# Patient Record
Sex: Male | Born: 1960 | Race: Black or African American | Hispanic: No | Smoking: Never smoker
Health system: Southern US, Community
[De-identification: ages and names within clinical notes are randomized; demographics above are authoritative.]

## PROBLEM LIST (undated history)

## (undated) DIAGNOSIS — D649 Anemia, unspecified: Secondary | ICD-10-CM

## (undated) DIAGNOSIS — I10 Essential (primary) hypertension: Secondary | ICD-10-CM

## (undated) DIAGNOSIS — E119 Type 2 diabetes mellitus without complications: Secondary | ICD-10-CM

## (undated) HISTORY — PX: APPENDECTOMY: SHX54

## (undated) HISTORY — DX: Type 2 diabetes mellitus without complications: E11.9

## (undated) HISTORY — DX: Anemia, unspecified: D64.9

## (undated) HISTORY — DX: Essential (primary) hypertension: I10

## (undated) MED FILL — Luspatercept-aamt For Subcutaneous Inj 75 MG: SUBCUTANEOUS | Qty: 1.5 | Status: AC

---

## 2021-07-23 NOTE — Progress Notes (Shared)
° °  [place], [place] [address]   CLINIC:  Medical Oncology/Hematology  No care team member to display  CHIEF COMPLAINTS/PURPOSE OF CONSULTATION:  ***  HISTORY OF PRESENTING ILLNESS:  Stephen Watts 60 y.o. male is here because of ***, at the request of {REFERRING PHYSICIAN}.  ***He was found to have abnormal CBC from *** ***He denies recent chest pain on exertion, shortness of breath on minimal exertion, pre-syncopal episodes, or palpitations. ***He had not noticed any recent bleeding such as epistaxis, hematuria or hematochezia ***The patient denies over the counter NSAID ingestion. He is not *** on antiplatelets agents. His last colonoscopy was *** ***He had no prior history or diagnosis of cancer. His age appropriate screening programs are up-to-date. ***He denies any pica and eats a variety of diet. ***He never donated blood or received blood transfusion ***The patient was prescribed oral iron supplements and he takes ***  MEDICAL HISTORY:  No past medical history on file.  SURGICAL HISTORY: *** The histories are not reviewed yet. Please review them in the "History" navigator section and refresh this [place].  SOCIAL HISTORY: Social History   Socioeconomic History   Marital status: Not on file    Spouse name: Not on file   Number of children: Not on file   Years of education: Not on file   Highest education level: Not on file  Occupational History   Not on file  Tobacco Use   Smoking status: Not on file   Smokeless tobacco: Not on file  Substance and Sexual Activity   Alcohol use: Not on file   Drug use: Not on file   Sexual activity: Not on file  Other Topics Concern   Not on file  Social History Narrative   Not on file   Social Determinants of Health   Financial Resource Strain: Not on file  Food Insecurity: Not on file  Transportation Needs: Not on file  Physical Activity: Not on file  Stress: Not on file   Social Connections: Not on file  Intimate Partner Violence: Not on file    FAMILY HISTORY: No family history on file.  ALLERGIES:  has no allergies on file.  MEDICATIONS:  No current outpatient medications on file.   No current facility-administered medications for this visit.    REVIEW OF SYSTEMS:   Review of Systems - Oncology   PHYSICAL EXAMINATION: ECOG PERFORMANCE STATUS: {CHL ONC ECOG PS:[id number]}  There were no vitals filed for this visit. There were no vitals filed for this visit. Physical Exam   LABORATORY DATA:  I have reviewed the data as listed No results found for this or any previous visit (from the past 2160 hour(s)).  RADIOGRAPHIC STUDIES: I have personally reviewed the radiological images as listed and agreed with the findings in the report. No results found.  ASSESSMENT:  ***   PLAN:  ***   All questions were answered. The patient knows to call the clinic with any problems, questions or concerns.  Derek Jack, MD 07/23/21 6:41 PM  [place] [phone]   I, ***, am acting as a scribe for Dr. Derek Jack.  {Add Barista Statement}

## 2021-07-24 ENCOUNTER — Encounter (HOSPITAL_COMMUNITY): Payer: 59 | Admitting: Hematology

## 2021-08-28 ENCOUNTER — Encounter (HOSPITAL_COMMUNITY): Payer: 59 | Admitting: Hematology

## 2021-09-11 ENCOUNTER — Other Ambulatory Visit (HOSPITAL_COMMUNITY): Payer: Self-pay | Admitting: Physician Assistant

## 2021-09-11 ENCOUNTER — Other Ambulatory Visit: Payer: Self-pay

## 2021-09-11 ENCOUNTER — Inpatient Hospital Stay (HOSPITAL_COMMUNITY): Payer: 59

## 2021-09-11 ENCOUNTER — Inpatient Hospital Stay (HOSPITAL_COMMUNITY): Payer: 59 | Attending: Hematology | Admitting: Hematology

## 2021-09-11 VITALS — BP 140/71 | HR 100 | Temp 98.5°F | Resp 18 | Ht 67.0 in | Wt 223.8 lb

## 2021-09-11 DIAGNOSIS — Z803 Family history of malignant neoplasm of breast: Secondary | ICD-10-CM | POA: Diagnosis not present

## 2021-09-11 DIAGNOSIS — R5383 Other fatigue: Secondary | ICD-10-CM | POA: Insufficient documentation

## 2021-09-11 DIAGNOSIS — R197 Diarrhea, unspecified: Secondary | ICD-10-CM | POA: Diagnosis not present

## 2021-09-11 DIAGNOSIS — Z800 Family history of malignant neoplasm of digestive organs: Secondary | ICD-10-CM | POA: Insufficient documentation

## 2021-09-11 DIAGNOSIS — R634 Abnormal weight loss: Secondary | ICD-10-CM | POA: Insufficient documentation

## 2021-09-11 DIAGNOSIS — R109 Unspecified abdominal pain: Secondary | ICD-10-CM | POA: Diagnosis not present

## 2021-09-11 DIAGNOSIS — D649 Anemia, unspecified: Secondary | ICD-10-CM | POA: Diagnosis present

## 2021-09-11 DIAGNOSIS — D75839 Thrombocytosis, unspecified: Secondary | ICD-10-CM | POA: Diagnosis present

## 2021-09-11 DIAGNOSIS — R350 Frequency of micturition: Secondary | ICD-10-CM | POA: Insufficient documentation

## 2021-09-11 DIAGNOSIS — R351 Nocturia: Secondary | ICD-10-CM | POA: Diagnosis not present

## 2021-09-11 DIAGNOSIS — R7989 Other specified abnormal findings of blood chemistry: Secondary | ICD-10-CM | POA: Diagnosis not present

## 2021-09-11 DIAGNOSIS — E669 Obesity, unspecified: Secondary | ICD-10-CM | POA: Diagnosis not present

## 2021-09-11 LAB — CBC WITH DIFFERENTIAL/PLATELET
Abs Immature Granulocytes: 0.11 10*3/uL — ABNORMAL HIGH (ref 0.00–0.07)
Basophils Absolute: 0.1 10*3/uL (ref 0.0–0.1)
Basophils Relative: 1 %
Eosinophils Absolute: 0.1 10*3/uL (ref 0.0–0.5)
Eosinophils Relative: 1 %
HCT: 20.6 % — ABNORMAL LOW (ref 39.0–52.0)
Hemoglobin: 6.7 g/dL — CL (ref 13.0–17.0)
Immature Granulocytes: 2 %
Lymphocytes Relative: 30 %
Lymphs Abs: 2 10*3/uL (ref 0.7–4.0)
MCH: 32.1 pg (ref 26.0–34.0)
MCHC: 32.5 g/dL (ref 30.0–36.0)
MCV: 98.6 fL (ref 80.0–100.0)
Monocytes Absolute: 0.8 10*3/uL (ref 0.1–1.0)
Monocytes Relative: 12 %
Neutro Abs: 3.5 10*3/uL (ref 1.7–7.7)
Neutrophils Relative %: 54 %
Platelets: 645 10*3/uL — ABNORMAL HIGH (ref 150–400)
RBC: 2.09 MIL/uL — ABNORMAL LOW (ref 4.22–5.81)
RDW: 28.8 % — ABNORMAL HIGH (ref 11.5–15.5)
WBC: 6.6 10*3/uL (ref 4.0–10.5)
nRBC: 1.7 % — ABNORMAL HIGH (ref 0.0–0.2)

## 2021-09-11 LAB — COMPREHENSIVE METABOLIC PANEL
ALT: 22 U/L (ref 0–44)
AST: 23 U/L (ref 15–41)
Albumin: 4.4 g/dL (ref 3.5–5.0)
Alkaline Phosphatase: 53 U/L (ref 38–126)
Anion gap: 9 (ref 5–15)
BUN: 15 mg/dL (ref 8–23)
CO2: 25 mmol/L (ref 22–32)
Calcium: 9.6 mg/dL (ref 8.9–10.3)
Chloride: 103 mmol/L (ref 98–111)
Creatinine, Ser: 1 mg/dL (ref 0.61–1.24)
GFR, Estimated: >60 mL/min (ref >60)
Glucose, Bld: 175 mg/dL — ABNORMAL HIGH (ref 70–99)
Potassium: 3.7 mmol/L (ref 3.5–5.1)
Sodium: 137 mmol/L (ref 135–145)
Total Bilirubin: 1 mg/dL (ref 0.3–1.2)
Total Protein: 8.2 g/dL — ABNORMAL HIGH (ref 6.5–8.1)

## 2021-09-11 LAB — IRON AND TIBC
Iron: 196 ug/dL — ABNORMAL HIGH (ref 45–182)
Saturation Ratios: 85 % — ABNORMAL HIGH (ref 17.9–39.5)
TIBC: 232 ug/dL — ABNORMAL LOW (ref 250–450)
UIBC: 36 ug/dL

## 2021-09-11 LAB — RETICULOCYTES
Immature Retic Fract: 32.1 % — ABNORMAL HIGH (ref 2.3–15.9)
RBC.: 2.05 MIL/uL — ABNORMAL LOW (ref 4.22–5.81)
Retic Count, Absolute: 20.3 10*3/uL (ref 19.0–186.0)
Retic Ct Pct: 1 % (ref 0.4–3.1)

## 2021-09-11 LAB — FERRITIN: Ferritin: 1070 ng/mL — ABNORMAL HIGH (ref 24–336)

## 2021-09-11 LAB — LACTATE DEHYDROGENASE: LDH: 160 U/L (ref 98–192)

## 2021-09-11 NOTE — Progress Notes (Signed)
[place], [place] [address]   CLINIC:  Medical Oncology/Hematology  Patient Care Team: System, Provider Not In as PCP - General Derek Jack, MD as Medical Oncologist (Hematology)  CHIEF COMPLAINTS/PURPOSE OF CONSULTATION:  Evaluation of normocytic anemia  HISTORY OF PRESENTING ILLNESS:  Stephen Watts 61 y.o. male is here because of evaluation of anemia, at the request of Joyce Gross, [place].  Today he reports feeling well. He ws diagnosed with anemia 1 year ago. He denies history of blood transfusion or iron infusions. He is taking 2 iron tablets daily, and he has been taking them for 1 year. His last colonoscopy was 3 years ago in [place]. He reports fatigue and that his feet feel occasionally heavy. He denies hematuria, hematochezia, and black stools. He denies ice pica. He denies fevers and drenching night sweats. He reports losing 30 lbs after starting metformin 6 months ago; he reports frequent diarrhea following starting diarrhea. He reports right flank pain. He denies dysuria. He reports chronic urinary frequency. He denies history of kidney stones. He currently works as a Building control surveyor. He denies smoking history. His paternal aunt had pancreatic cancer, and his sister had breast cancer.   MEDICAL HISTORY:  No past medical history on file.  SURGICAL HISTORY:   SOCIAL HISTORY: Social History   Socioeconomic History   Marital status: Married    Spouse name: Not on file   Number of children: Not on file   Years of education: Not on file   Highest education level: Not on file  Occupational History   Not on file  Tobacco Use   Smoking status: Not on file   Smokeless tobacco: Not on file  Substance and Sexual Activity   Alcohol use: Not on file   Drug use: Not on file   Sexual activity: Not on file  Other Topics Concern   Not on file  Social History Narrative   Not on file   Social Determinants of Health   Financial  Resource Strain: Not on file  Food Insecurity: Not on file  Transportation Needs: Not on file  Physical Activity: Not on file  Stress: Not on file  Social Connections: Not on file  Intimate Partner Violence: Not on file    FAMILY HISTORY: No family history on file.  ALLERGIES:  is allergic to ibuprofen and sulfa antibiotics.  MEDICATIONS:  Current Outpatient Medications  Medication Sig Dispense Refill   metFORMIN (GLUCOPHAGE) 500 MG tablet Take by mouth 2 (two) times daily with a meal.     olmesartan-hydrochlorothiazide (BENICAR HCT) 40-25 MG tablet Take 1 tablet by mouth daily.     No current facility-administered medications for this visit.    REVIEW OF SYSTEMS:   Review of Systems  Constitutional:  Positive for fatigue. Negative for appetite change and fever.  Gastrointestinal:  Negative for blood in stool.  Endocrine: Negative for hot flashes.  Genitourinary:  Positive for frequency and nocturia. Negative for dysuria and hematuria.   Musculoskeletal:  Positive for flank pain (R side 6/10).  All other systems reviewed and are negative.   PHYSICAL EXAMINATION: ECOG PERFORMANCE STATUS: 0 - Asymptomatic  Vitals:   09/11/21 1200  BP: 140/71  Pulse: 100  Resp: 18  Temp: 98.5 F (36.9 C)  SpO2: 100%   Filed Weights   09/11/21 1200  Weight: 223 lb 12.8 oz (101.5 kg)   Physical Exam Vitals reviewed.  Constitutional:      Appearance: Normal appearance. He is  obese.  Cardiovascular:     Rate and Rhythm: Normal rate and regular rhythm.     Pulses: Normal pulses.     Heart sounds: Normal heart sounds.  Pulmonary:     Effort: Pulmonary effort is normal.     Breath sounds: Normal breath sounds.  Abdominal:     Palpations: Abdomen is soft. There is no hepatomegaly, splenomegaly or mass.     Tenderness: There is no abdominal tenderness.  Musculoskeletal:     Right lower leg: No edema.     Left lower leg: No edema.  Neurological:     General: No focal deficit  present.     Mental Status: He is alert and oriented to person, place, and time.  Psychiatric:        Mood and Affect: Mood normal.        Behavior: Behavior normal.     LABORATORY DATA:  I have reviewed the data as listed Recent Results (from the past 2160 hour(s))  Reticulocytes     Status: Abnormal   Collection Time: 09/11/21 12:49 PM  Result Value Ref Range   Retic Ct Pct 1.0 0.4 - 3.1 %   RBC. 2.05 (L) 4.22 - 5.81 MIL/uL   Retic Count, Absolute 20.3 19.0 - 186.0 K/uL   Immature Retic Fract 32.1 (H) 2.3 - 15.9 %    Comment: Performed at [facility], [address], [place], [place] [address]  Lactate dehydrogenase     Status: None   Collection Time: 09/11/21 12:49 PM  Result Value Ref Range   LDH 160 98 - 192 U/L    Comment: Performed at [facility], [address], [place], [place] [address]  Comprehensive metabolic panel     Status: Abnormal   Collection Time: 09/11/21 12:49 PM  Result Value Ref Range   Sodium 137 135 - 145 mmol/L   Potassium 3.7 3.5 - 5.1 mmol/L   Chloride 103 98 - 111 mmol/L   CO2 25 22 - 32 mmol/L   Glucose, Bld 175 (H) 70 - 99 mg/dL    Comment: Glucose reference range applies only to samples taken after fasting for at least 8 hours.   BUN 15 8 - 23 mg/dL   Creatinine, Ser 1.00 0.61 - 1.24 mg/dL   Calcium 9.6 8.9 - 10.3 mg/dL   Total Protein 8.2 (H) 6.5 - 8.1 g/dL   Albumin 4.4 3.5 - 5.0 g/dL   AST 23 15 - 41 U/L   ALT 22 0 - 44 U/L   Alkaline Phosphatase 53 38 - 126 U/L   Total Bilirubin 1.0 0.3 - 1.2 mg/dL   GFR, Estimated >60 >60 mL/min    Comment: (NOTE) Calculated using the CKD-EPI Creatinine Equation (2021)    Anion gap 9 5 - 15    Comment: Performed at [facility], [address], [place], [place] [address]  CBC with Differential     Status: Abnormal   Collection Time: 09/11/21 12:49 PM  Result Value Ref Range   WBC 6.6 4.0 - 10.5 K/uL   RBC 2.09 (L) 4.22 - 5.81 MIL/uL   Hemoglobin 6.7 (LL) 13.0 - 17.0 g/dL    Comment: This  critical result has verified and been called to [place] by Levan Hurst on 02 03 2023 at 1317, and has been read back.    HCT 20.6 (L) 39.0 - 52.0 %   MCV 98.6 80.0 - 100.0 fL   MCH 32.1 26.0 - 34.0 pg   MCHC  32.5 30.0 - 36.0 g/dL   RDW 28.8 (H) 11.5 - 15.5 %   Platelets 645 (H) 150 - 400 K/uL   nRBC 1.7 (H) 0.0 - 0.2 %   Neutrophils Relative % 54 %   Neutro Abs 3.5 1.7 - 7.7 K/uL   Lymphocytes Relative 30 %   Lymphs Abs 2.0 0.7 - 4.0 K/uL   Monocytes Relative 12 %   Monocytes Absolute 0.8 0.1 - 1.0 K/uL   Eosinophils Relative 1 %   Eosinophils Absolute 0.1 0.0 - 0.5 K/uL   Basophils Relative 1 %   Basophils Absolute 0.1 0.0 - 0.1 K/uL   WBC Morphology MORPHOLOGY UNREMARKABLE    RBC Morphology MORPHOLOGY UNREMARKABLE    Smear Review MORPHOLOGY UNREMARKABLE    Immature Granulocytes 2 %   Abs Immature Granulocytes 0.11 (H) 0.00 - 0.07 K/uL   Schistocytes PRESENT    Tear Drop Cells PRESENT    Basophilic Stippling PRESENT     Comment: Performed at [facility], [address] [place], [place] [address]  Ferritin     Status: Abnormal   Collection Time: 09/11/21 12:49 PM  Result Value Ref Range   Ferritin 1,070 (H) 24 - 336 ng/mL    Comment: Performed at [facility], [address], [place], [place] [address]  Iron and TIBC ([facility] ONLY)     Status: Abnormal   Collection Time: 09/11/21 12:49 PM  Result Value Ref Range   Iron 196 (H) 45 - 182 ug/dL   TIBC 232 (L) 250 - 450 ug/dL   Saturation Ratios 85 (H) 17.9 - 39.5 %   UIBC 36 ug/dL    Comment: Performed at [facility], [address], [place], [place] [address]    RADIOGRAPHIC STUDIES: I have personally reviewed the radiological images as listed and agreed with the findings in the report. No results found.  ASSESSMENT:  Normocytic anemia and thrombocytosis: - Patient seen for normocytic anemia with CBC on 05/08/2021 showing hemoglobin 8.4, MCV 97 and platelet count 631. - Ferritin was 1454,  percent saturation was 85. - Denies any prior transfusion history.  Denies any parenteral iron therapy. - He is taking iron tablet 2 times daily for the last 1 year. - Colonoscopy was reportedly done in [place] 3 years ago, within normal limits. - He denies any bleeding per rectum or melena.  No ice pica. - I have reviewed labs from 2019 from [facility] when his hemoglobin was between 8-9 and platelet count elevated. - He denies any fevers or night sweats.  Reports 30 pound weight loss in the last 6 months since he was started on metformin (diarrhea).   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 - Denies any smoking history. - Paternal aunt had pancreatic cancer.  Sister had breast cancer.   PLAN:  Normocytic anemia and thrombocytosis: - We have reviewed his previous labs and labs from 2019. - Differential also shows NRBC, indicative of bone marrow infiltrative process. - Recommend repeating CBC and check for nutritional deficiencies. - Because of elevated platelet count, recommend JAK2 [id number] and reflex panel testing for myeloproliferative disorders. - I have also recommended bone marrow aspiration and biopsy.  2.  Elevated ferritin: - He has a significantly elevated ferritin and percent saturation. - We will check for hemochromatosis panel.  Addendum: - His hemoglobin was 6.7.  We will arrange for blood transfusion on Monday.  He is asymptomatic at this time.  All questions were answered. The patient knows to call the clinic with any problems, questions or concerns.  Derek Jack, MD 09/11/21 2:14 PM  [place] [phone]   I, Thana Ates, am acting as a scribe for Dr. Derek Jack.  I, Derek Jack MD, have reviewed the above documentation for accuracy and completeness, and I agree with the above.

## 2021-09-11 NOTE — Patient Instructions (Addendum)
[place] [facility] Discharge Instructions  You were seen and examined today by Dr. Delton Coombes. Dr. Delton Coombes is a hematologist, meaning that he specializes in blood abnormalities. Dr. Delton Coombes discussed your past medical history, family history of cancers/blood conditions and the events that led to you being here today.  You were referred to Dr. Delton Coombes due to anemia. Dr. Delton Coombes has recommended additional lab work today in an attempt to identify the cause of your anemia. Your ferritin (iron level) and platelets were high, Dr. Delton Coombes will also check lab work specific for this as well.  If these results are inconclusive, the next step is a bone marrow biopsy.  Follow-up as scheduled.   Thank you for choosing [place] at [facility] to provide your oncology and hematology care.  To afford each patient quality time with our provider, please arrive at least 15 minutes before your scheduled appointment time.   If you have a lab appointment with the [facility] please come in thru the Main Entrance and check in at the main information desk.  You need to re-schedule your appointment should you arrive 10 or more minutes late.  We strive to give you quality time with our providers, and arriving late affects you and other patients whose appointments are after yours.  Also, if you no show three or more times for appointments you may be dismissed from the clinic at the providers discretion.     Again, thank you for choosing [facility].  Our hope is that these requests will decrease the amount of time that you wait before being seen by our physicians.       _____________________________________________________________  Should you have questions after your visit to [facility], please contact our office at [phone] and follow the prompts.  Our office hours are 8:00 a.m. and 4:30 p.m. Monday - Friday.  Please  note that voicemails left after 4:00 p.m. may not be returned until the following business day.  We are closed weekends and major holidays.  You do have access to a nurse 24-7, just call the main number to the clinic [phone] and do not press any options, hold on the line and a nurse will answer the phone.    For prescription refill requests, have your pharmacy contact our office and allow 72 hours.    Due to Covid, you will need to wear a mask upon entering the hospital. If you do not have a mask, a mask will be given to you at the Main Entrance upon arrival. For doctor visits, patients may have 1 support person age 42 or older with them. For treatment visits, patients can not have anyone with them due to social distancing guidelines and our immunocompromised population.

## 2021-09-11 NOTE — Progress Notes (Signed)
CRITICAL VALUE ALERT Critical value received:  Hemoglobin 6.7 Date of notification:  09-11-21 Time of notification: 1330 Critical value read back:  Yes.   Nurse who received alert:  C. Kuba Shepherd RN MD notified time and response:  0623, will give one unit per MD.

## 2021-09-14 ENCOUNTER — Inpatient Hospital Stay (HOSPITAL_COMMUNITY): Payer: 59

## 2021-09-14 ENCOUNTER — Other Ambulatory Visit (HOSPITAL_COMMUNITY): Payer: Self-pay

## 2021-09-14 ENCOUNTER — Other Ambulatory Visit: Payer: Self-pay

## 2021-09-14 DIAGNOSIS — D649 Anemia, unspecified: Secondary | ICD-10-CM

## 2021-09-14 LAB — PROTEIN ELECTROPHORESIS, SERUM
A/G Ratio: 1.3 (ref 0.7–1.7)
Albumin ELP: 4.6 g/dL — ABNORMAL HIGH (ref 2.9–4.4)
Alpha-1-Globulin: 0.2 g/dL (ref 0.0–0.4)
Alpha-2-Globulin: 0.5 g/dL (ref 0.4–1.0)
Beta Globulin: 1 g/dL (ref 0.7–1.3)
Gamma Globulin: 1.9 g/dL — ABNORMAL HIGH (ref 0.4–1.8)
Globulin, Total: 3.6 g/dL (ref 2.2–3.9)
Total Protein ELP: 8.2 g/dL (ref 6.0–8.5)

## 2021-09-14 LAB — ABO/RH: ABO/RH(D): O POS

## 2021-09-14 LAB — CBC WITH DIFFERENTIAL/PLATELET
Abs Immature Granulocytes: 0.07 10*3/uL (ref 0.00–0.07)
Basophils Absolute: 0 10*3/uL (ref 0.0–0.1)
Basophils Relative: 1 %
Eosinophils Absolute: 0.1 10*3/uL (ref 0.0–0.5)
Eosinophils Relative: 1 %
HCT: 19.4 % — ABNORMAL LOW (ref 39.0–52.0)
Hemoglobin: 6.3 g/dL — CL (ref 13.0–17.0)
Immature Granulocytes: 1 %
Lymphocytes Relative: 25 %
Lymphs Abs: 2.1 10*3/uL (ref 0.7–4.0)
MCH: 32.8 pg (ref 26.0–34.0)
MCHC: 32.5 g/dL (ref 30.0–36.0)
MCV: 101 fL — ABNORMAL HIGH (ref 80.0–100.0)
Monocytes Absolute: 0.7 10*3/uL (ref 0.1–1.0)
Monocytes Relative: 9 %
Neutro Abs: 5.3 10*3/uL (ref 1.7–7.7)
Neutrophils Relative %: 63 %
Platelets: 620 10*3/uL — ABNORMAL HIGH (ref 150–400)
RBC: 1.92 MIL/uL — ABNORMAL LOW (ref 4.22–5.81)
RDW: 29.3 % — ABNORMAL HIGH (ref 11.5–15.5)
WBC: 8.2 10*3/uL (ref 4.0–10.5)
nRBC: 1.1 % — ABNORMAL HIGH (ref 0.0–0.2)

## 2021-09-14 LAB — PREPARE RBC (CROSSMATCH)

## 2021-09-14 LAB — METHYLMALONIC ACID, SERUM: Methylmalonic Acid, Quantitative: 233 nmol/L (ref 0–378)

## 2021-09-14 LAB — KAPPA/LAMBDA LIGHT CHAINS
Kappa free light chain: 47.5 mg/L — ABNORMAL HIGH (ref 3.3–19.4)
Kappa, lambda light chain ratio: 1.63 (ref 0.26–1.65)
Lambda free light chains: 29.1 mg/L — ABNORMAL HIGH (ref 5.7–26.3)

## 2021-09-14 LAB — PATHOLOGIST SMEAR REVIEW

## 2021-09-14 MED ORDER — ACETAMINOPHEN 325 MG PO TABS
650.0000 mg | ORAL_TABLET | Freq: Once | ORAL | Status: AC
  Administered 2021-09-14: 650 mg via ORAL
  Filled 2021-09-14: qty 2

## 2021-09-14 MED ORDER — SODIUM CHLORIDE 0.9% IV SOLUTION
250.0000 mL | Freq: Once | INTRAVENOUS | Status: AC
  Administered 2021-09-14: 250 mL via INTRAVENOUS

## 2021-09-14 MED ORDER — DIPHENHYDRAMINE HCL 25 MG PO CAPS
25.0000 mg | ORAL_CAPSULE | Freq: Once | ORAL | Status: AC
  Administered 2021-09-14: 25 mg via ORAL
  Filled 2021-09-14: qty 1

## 2021-09-14 NOTE — Patient Instructions (Signed)
[facility]  Discharge Instructions: Thank you for choosing [facility] to provide your oncology and hematology care.  If you have a lab appointment with the [facility], please come in thru the Main Entrance and check in at the main information desk.  Wear comfortable clothing and clothing appropriate for easy access to any Portacath or PICC line.   We strive to give you quality time with your provider. You may need to reschedule your appointment if you arrive late (15 or more minutes).  Arriving late affects you and other patients whose appointments are after yours.  Also, if you miss three or more appointments without notifying the office, you may be dismissed from the clinic at the providers discretion.      For prescription refill requests, have your pharmacy contact our office and allow 72 hours for refills to be completed.    Today you received 1 unit of blood.  BELOW ARE SYMPTOMS THAT SHOULD BE REPORTED IMMEDIATELY: *FEVER GREATER THAN 100.4 F (38 C) OR HIGHER *CHILLS OR SWEATING *NAUSEA AND VOMITING THAT IS NOT CONTROLLED WITH YOUR NAUSEA MEDICATION *UNUSUAL SHORTNESS OF BREATH *UNUSUAL BRUISING OR BLEEDING *URINARY PROBLEMS (pain or burning when urinating, or frequent urination) *BOWEL PROBLEMS (unusual diarrhea, constipation, pain near the anus) TENDERNESS IN MOUTH AND THROAT WITH OR WITHOUT PRESENCE OF ULCERS (sore throat, sores in mouth, or a toothache) UNUSUAL RASH, SWELLING OR PAIN  UNUSUAL VAGINAL DISCHARGE OR ITCHING   Items with * indicate a potential emergency and should be followed up as soon as possible or go to the Emergency Department if any problems should occur.  Please show the CHEMOTHERAPY ALERT CARD or IMMUNOTHERAPY ALERT CARD at check-in to the Emergency Department and triage nurse.  Should you have questions after your visit or need to cancel or reschedule your appointment, please contact [facility] [phone]   and follow the prompts.  Office hours are 8:00 a.m. to 4:30 p.m. Monday - Friday. Please note that voicemails left after 4:00 p.m. may not be returned until the following business day.  We are closed weekends and major holidays. You have access to a nurse at all times for urgent questions. Please call the main number to the clinic [phone] and follow the prompts.  For any non-urgent questions, you may also contact your provider using MyChart. We now offer e-Visits for anyone 54 and older to request care online for non-urgent symptoms. For details visit [email or web address].   Also download the MyChart app! Go to the app store, search "MyChart", open the app, select , and log in with your MyChart username and password.  Due to Covid, a mask is required upon entering the hospital/clinic. If you do not have a mask, one will be given to you upon arrival. For doctor visits, patients may have 1 support person aged 5 or older with them. For treatment visits, patients cannot have anyone with them due to current Covid guidelines and our immunocompromised population.

## 2021-09-14 NOTE — Progress Notes (Signed)
Pt presents today for 1 unit of blood per provider's order. Vital signs stable and pt voiced no new complaints at this time.  Peripheral IV started with good blood return pre and post infusion.  1 unit of blood given today per MD orders. Tolerated infusion without adverse affects. Vital signs stable. No complaints at this time. Discharged from clinic ambulatory in stable condition. Alert and oriented x 3. F/U with [facility] as scheduled.   

## 2021-09-15 LAB — BPAM RBC
Blood Product Expiration Date: 202303122359
ISSUE DATE / TIME: 202302061035
Unit Type and Rh: 5100

## 2021-09-15 LAB — COPPER, SERUM: Copper: 107 ug/dL (ref 69–132)

## 2021-09-15 LAB — TYPE AND SCREEN
ABO/RH(D): O POS
Antibody Screen: NEGATIVE
Unit division: 0

## 2021-09-21 LAB — HEMOCHROMATOSIS DNA-PCR(C282Y,H63D)

## 2021-09-22 LAB — CALR + JAK2 E12-15 + MPL (REFLEXED)

## 2021-09-22 LAB — JAK2 V617F, W REFLEX TO CALR/E12/MPL

## 2021-09-30 ENCOUNTER — Inpatient Hospital Stay (HOSPITAL_COMMUNITY): Payer: 59 | Admitting: Hematology

## 2021-10-07 ENCOUNTER — Other Ambulatory Visit: Payer: Self-pay | Admitting: Physician Assistant

## 2021-10-07 ENCOUNTER — Other Ambulatory Visit: Payer: Self-pay | Admitting: Radiology

## 2021-10-08 ENCOUNTER — Other Ambulatory Visit: Payer: Self-pay

## 2021-10-08 ENCOUNTER — Encounter (HOSPITAL_COMMUNITY): Payer: Self-pay

## 2021-10-08 ENCOUNTER — Ambulatory Visit (HOSPITAL_COMMUNITY)
Admission: RE | Admit: 2021-10-08 | Discharge: 2021-10-08 | Disposition: A | Discharge status: Home / Self Care | Payer: 59 | Source: Ambulatory Visit | Attending: Hematology | Admitting: Hematology

## 2021-10-08 DIAGNOSIS — D649 Anemia, unspecified: Secondary | ICD-10-CM

## 2021-10-08 DIAGNOSIS — D75839 Thrombocytosis, unspecified: Secondary | ICD-10-CM | POA: Diagnosis not present

## 2021-10-08 DIAGNOSIS — D469 Myelodysplastic syndrome, unspecified: Secondary | ICD-10-CM | POA: Diagnosis not present

## 2021-10-08 DIAGNOSIS — E119 Type 2 diabetes mellitus without complications: Secondary | ICD-10-CM | POA: Diagnosis not present

## 2021-10-08 LAB — CBC WITH DIFFERENTIAL/PLATELET
Abs Immature Granulocytes: 0.04 10*3/uL (ref 0.00–0.07)
Basophils Absolute: 0.1 10*3/uL (ref 0.0–0.1)
Basophils Relative: 1 %
Eosinophils Absolute: 0.1 10*3/uL (ref 0.0–0.5)
Eosinophils Relative: 2 %
HCT: 24.6 % — ABNORMAL LOW (ref 39.0–52.0)
Hemoglobin: 8.2 g/dL — ABNORMAL LOW (ref 13.0–17.0)
Immature Granulocytes: 1 %
Lymphocytes Relative: 25 %
Lymphs Abs: 1.6 10*3/uL (ref 0.7–4.0)
MCH: 32.8 pg (ref 26.0–34.0)
MCHC: 33.3 g/dL (ref 30.0–36.0)
MCV: 98.4 fL (ref 80.0–100.0)
Monocytes Absolute: 0.7 10*3/uL (ref 0.1–1.0)
Monocytes Relative: 12 %
Neutro Abs: 3.7 10*3/uL (ref 1.7–7.7)
Neutrophils Relative %: 59 %
Platelets: 570 10*3/uL — ABNORMAL HIGH (ref 150–400)
RBC: 2.5 MIL/uL — ABNORMAL LOW (ref 4.22–5.81)
RDW: 26.7 % — ABNORMAL HIGH (ref 11.5–15.5)
WBC: 6.1 10*3/uL (ref 4.0–10.5)
nRBC: 1.3 % — ABNORMAL HIGH (ref 0.0–0.2)

## 2021-10-08 LAB — GLUCOSE, CAPILLARY: Glucose-Capillary: 149 mg/dL — ABNORMAL HIGH (ref 70–99)

## 2021-10-08 MED ORDER — MIDAZOLAM HCL 2 MG/2ML IJ SOLN
INTRAMUSCULAR | Status: AC
  Filled 2021-10-08: qty 4

## 2021-10-08 MED ORDER — FENTANYL CITRATE (PF) 100 MCG/2ML IJ SOLN
INTRAMUSCULAR | Status: AC | PRN
Start: 2021-10-08 — End: 2021-10-08
  Administered 2021-10-08 (×2): 50 ug via INTRAVENOUS

## 2021-10-08 MED ORDER — FENTANYL CITRATE (PF) 100 MCG/2ML IJ SOLN
INTRAMUSCULAR | Status: AC
  Filled 2021-10-08: qty 2

## 2021-10-08 MED ORDER — SODIUM CHLORIDE 0.9 % IV SOLN: INTRAVENOUS | Status: DC

## 2021-10-08 MED ORDER — MIDAZOLAM HCL 2 MG/2ML IJ SOLN
INTRAMUSCULAR | Status: AC | PRN
  Administered 2021-10-08 (×3): 1 mg via INTRAVENOUS

## 2021-10-08 NOTE — Procedures (Signed)
Pre-procedure Diagnosis: MDS ?Post-procedure Diagnosis: Same ? ?Technically successful CT guided bone marrow aspiration and biopsy of left iliac crest.  ? ?Complications: None Immediate ? ?EBL: None ? ?Signed: ?Sandi Mariscal ?Pager: [phone] ?10/08/2021, 9:19 AM ? ? ?

## 2021-10-08 NOTE — Discharge Instructions (Addendum)
Please call Interventional Radiology clinic [phone] with any questions or concerns. ? ?You may remove your dressing and shower tomorrow.  Do not submerge in a tub or pool until site is healed. ? ? ?Bone Marrow Aspiration and Bone Marrow Biopsy, Adult, Care After ?This sheet gives you information about how to care for yourself after your procedure. Your health care provider may also give you more specific instructions. If you have problems or questions, contact your health care provider. ?What can I expect after the procedure? ?After the procedure, it is common to have: ?Mild pain and tenderness. ?Swelling. ?Bruising. ?Follow these instructions at home: ?Puncture site care ?Follow instructions from your health care provider about how to take care of the puncture site. Make sure you: ?Wash your hands with soap and water before and after you change your bandage (dressing). If soap and water are not available, use hand sanitizer. ?Change your dressing as told by your health care provider. ?Check your puncture site every day for signs of infection. Check for: ?More redness, swelling, or pain. ?Fluid or blood. ?Warmth. ?Pus or a bad smell.   ?Activity ?Return to your normal activities as told by your health care provider. Ask your health care provider what activities are safe for you. ?Do not lift anything that is heavier than 10 lb (4.5 kg), or the limit that you are told, until your health care provider says that it is safe. ?Do not drive for 24 hours if you were given a sedative during your procedure. ?General instructions ?Take over-the-counter and prescription medicines only as told by your health care provider. ?Do not take baths, swim, or use a hot tub until your health care provider approves. Ask your health care provider if you may take showers. You may only be allowed to take sponge baths. ?If directed, put ice on the affected area. To do this: ?Put ice in a plastic bag. ?Place a towel between your skin and  the bag. ?Leave the ice on for 20 minutes, 2-3 times a day. ?Keep all follow-up visits as told by your health care provider. This is important.   ?Contact a health care provider if: ?Your pain is not controlled with medicine. ?You have a fever. ?You have more redness, swelling, or pain around the puncture site. ?You have fluid or blood coming from the puncture site. ?Your puncture site feels warm to the touch. ?You have pus or a bad smell coming from the puncture site. ?Summary ?After the procedure, it is common to have mild pain, tenderness, swelling, and bruising. ?Follow instructions from your health care provider about how to take care of the puncture site and what activities are safe for you. ?Take over-the-counter and prescription medicines only as told by your health care provider. ?Contact a health care provider if you have any signs of infection, such as fluid or blood coming from the puncture site. ?This information is not intended to replace advice given to you by your health care provider. Make sure you discuss any questions you have with your health care provider. ?Document Revised: 12/12/2018 Document Reviewed: 12/12/2018 ?Elsevier Patient Education ? [place]. ? ? ?Moderate Conscious Sedation, Adult, Care After ?This sheet gives you information about how to care for yourself after your procedure. Your health care provider may also give you more specific instructions. If you have problems or questions, contact your health care provider. ?What can I expect after the procedure? ?After the procedure, it is common to have: ?Sleepiness for several hours. ?  Impaired judgment for several hours. ?Difficulty with balance. ?Vomiting if you eat too soon. ?Follow these instructions at home: ?For the time period you were told by your health care provider: ?Rest. ?Do not participate in activities where you could fall or become injured. ?Do not drive or use machinery. ?Do not drink alcohol. ?Do not take  sleeping pills or medicines that cause drowsiness. ?Do not make important decisions or sign legal documents. ?Do not take care of children on your own.  ?  ?  ?Eating and drinking ?Follow the diet recommended by your health care provider. ?Drink enough fluid to keep your urine pale yellow. ?If you vomit: ?Drink water, juice, or soup when you can drink without vomiting. ?Make sure you have little or no nausea before eating solid foods.   ?General instructions ?Take over-the-counter and prescription medicines only as told by your health care provider. ?Have a responsible adult stay with you for the time you are told. It is important to have someone help care for you until you are awake and alert. ?Do not smoke. ?Keep all follow-up visits as told by your health care provider. This is important. ?Contact a health care provider if: ?You are still sleepy or having trouble with balance after 24 hours. ?You feel light-headed. ?You keep feeling nauseous or you keep vomiting. ?You develop a rash. ?You have a fever. ?You have redness or swelling around the IV site. ?Get help right away if: ?You have trouble breathing. ?You have new-onset confusion at home. ?Summary ?After the procedure, it is common to feel sleepy, have impaired judgment, or feel nauseous if you eat too soon. ?Rest after you get home. Know the things you should not do after the procedure. ?Follow the diet recommended by your health care provider and drink enough fluid to keep your urine pale yellow. ?Get help right away if you have trouble breathing or new-onset confusion at home. ?This information is not intended to replace advice given to you by your health care provider. Make sure you discuss any questions you have with your health care provider. ?Document Revised: 11/23/2019 Document Reviewed: 06/21/2019 ?Elsevier Patient Education ? [place].  ?

## 2021-10-08 NOTE — Consult Note (Signed)
? ?Chief Complaint: ?Patient was seen in consultation today for CT-guided bone marrow biopsy ? ?Referring Physician(s): ?Derek Jack ? ?Supervising Physician: Sandi Mariscal ? ?Patient Status: [place] ? ?History of Present Illness: ?Stephen Watts is a 61 y.o. male with history of diabetes, normocytic anemia, thrombocytosis, elevated ferritin and differential showing nucleated red blood cells.  He presents today for CT-guided bone marrow biopsy to rule out myeloproliferative disorder, bone marrow infiltrative process. ? ?History reviewed. No pertinent past medical history. ? ?History reviewed. No pertinent surgical history. ? ?Allergies: ?Ibuprofen and Sulfa antibiotics ? ?Medications: ?Prior to Admission medications   ?Medication Sig Start Date End Date Taking? Authorizing Provider  ?amLODipine (NORVASC) 5 MG tablet Take 5 mg by mouth daily. 08/04/21  Yes [provider]  ?lisinopril-hydrochlorothiazide (ZESTORETIC) 20-25 MG tablet Take 1 tablet by mouth daily. 08/04/21  Yes [provider]  ?metFORMIN (GLUCOPHAGE) 1000 MG tablet Take 1,000 mg by mouth 2 (two) times daily. 06/01/21  Yes [provider]  ?Olmesartan-amLODIPine-HCTZ 40-10-25 MG TABS Take 1 tablet by mouth daily. 09/09/21  Yes [provider]  ?sildenafil (VIAGRA) 50 MG tablet Take 50 mg by mouth daily as needed. ?Patient not taking: Reported on 09/14/2021 04/29/21   [provider]  ?  ? ?History reviewed. No pertinent family history. ? ?Social History  ? ?Socioeconomic History  ? Marital status: Married  ?  Spouse name: Not on file  ? Number of children: Not on file  ? Years of education: Not on file  ? Highest education level: Not on file  ?Occupational History  ? Not on file  ?Tobacco Use  ? Smoking status: Not on file  ? Smokeless tobacco: Not on file  ?Substance and Sexual Activity  ? Alcohol use: Not on file  ? Drug use: Not on file  ? Sexual activity: Not on file  ?Other Topics Concern  ?  Not on file  ?Social History Narrative  ? Not on file  ? ?Social Determinants of Health  ? ?Financial Resource Strain: Not on file  ?Food Insecurity: Not on file  ?Transportation Needs: Not on file  ?Physical Activity: Not on file  ?Stress: Not on file  ?Social Connections: Not on file  ? ? ? ?Review of Systems denies fever, headache, chest pain, dyspnea, cough, abdominal/back pain, nausea, vomiting or bleeding.  He does have some right hip discomfort. ? ?Vital Signs: ?BP (!) 160/80   Pulse 93   Temp 98.7 ?F (37.1 ?C) (Oral)   Resp 18   Ht $R'5\' 7"'cr$  (1.702 m)   Wt 221 lb (100.2 kg)   SpO2 97%   BMI 34.61 kg/m?  ? ?Physical Exam awake, alert.  Chest clear to auscultation bilaterally.  Heart with regular rate and rhythm.  Abdomen soft, positive bowel sounds, nontender.  No lower extremity edema ? ?Imaging: ?No results found. ? ?Labs: ? ?CBC: ?Recent Labs  ?  09/11/21 ?1249 09/14/21 ?1308 10/08/21 ?0750  ?WBC 6.6 8.2 6.1  ?HGB 6.7* 6.3* 8.2*  ?HCT 20.6* 19.4* 24.6*  ?PLT 645* 620* 570*  ? ? ?COAGS: ?No results for input(s): INR, APTT in the last 8760 hours. ? ?BMP: ?Recent Labs  ?  09/11/21 ?1249  ?NA 137  ?K 3.7  ?CL 103  ?CO2 25  ?GLUCOSE 175*  ?BUN 15  ?CALCIUM 9.6  ?CREATININE 1.00  ?GFRNONAA >60  ? ? ?LIVER FUNCTION TESTS: ?Recent Labs  ?  09/11/21 ?1249  ?BILITOT 1.0  ?AST 23  ?ALT 22  ?ALKPHOS 53  ?  PROT 8.2*  ?ALBUMIN 4.4  ? ? ?TUMOR MARKERS: ?No results for input(s): AFPTM, CEA, CA199, CHROMGRNA in the last 8760 hours. ? ?Assessment and Plan: ?61 y.o. male with history of diabetes, normocytic anemia, thrombocytosis, elevated ferritin and differential showing nucleated red blood cells.  He presents today for CT-guided bone marrow biopsy to rule out myeloproliferative disorder, bone marrow infiltrative process.Risks and benefits of procedure was discussed with the patient  including, but not limited to bleeding, infection, damage to adjacent structures or low yield requiring additional tests. ? ?All of the  questions were answered and there is agreement to proceed. ? ?Consent signed and in chart. ? ? ? ?Thank you for this interesting consult.  I greatly enjoyed meeting Tashi Band and look forward to participating in their care.  A copy of this report was sent to the requesting provider on this date. ? ?Electronically Signed: ?Autumn Messing, PA-C ?10/08/2021, 8:16 AM ? ? ?I spent a total of  20 minutes in face to face in clinical consultation, greater than 50% of which was counseling/coordinating care for CT-guided bone marrow biopsy ? ?

## 2021-10-16 ENCOUNTER — Encounter (HOSPITAL_COMMUNITY): Payer: Self-pay | Admitting: Hematology

## 2021-10-23 ENCOUNTER — Encounter (HOSPITAL_COMMUNITY): Payer: Self-pay | Admitting: Hematology

## 2021-10-27 ENCOUNTER — Ambulatory Visit (HOSPITAL_COMMUNITY): Payer: 59 | Admitting: Hematology

## 2021-11-12 ENCOUNTER — Inpatient Hospital Stay (HOSPITAL_COMMUNITY): Payer: 59

## 2021-11-12 ENCOUNTER — Inpatient Hospital Stay (HOSPITAL_COMMUNITY): Payer: 59 | Attending: Hematology | Admitting: Hematology

## 2021-11-12 VITALS — BP 136/67 | HR 78 | Temp 98.7°F | Resp 18 | Ht 67.72 in | Wt 230.0 lb

## 2021-11-12 DIAGNOSIS — D649 Anemia, unspecified: Secondary | ICD-10-CM | POA: Diagnosis not present

## 2021-11-12 DIAGNOSIS — R7989 Other specified abnormal findings of blood chemistry: Secondary | ICD-10-CM | POA: Diagnosis not present

## 2021-11-12 DIAGNOSIS — R109 Unspecified abdominal pain: Secondary | ICD-10-CM | POA: Insufficient documentation

## 2021-11-12 DIAGNOSIS — R5383 Other fatigue: Secondary | ICD-10-CM | POA: Diagnosis not present

## 2021-11-12 DIAGNOSIS — Z79899 Other long term (current) drug therapy: Secondary | ICD-10-CM | POA: Insufficient documentation

## 2021-11-12 DIAGNOSIS — R200 Anesthesia of skin: Secondary | ICD-10-CM | POA: Diagnosis not present

## 2021-11-12 DIAGNOSIS — D469 Myelodysplastic syndrome, unspecified: Secondary | ICD-10-CM

## 2021-11-12 DIAGNOSIS — G479 Sleep disorder, unspecified: Secondary | ICD-10-CM | POA: Diagnosis not present

## 2021-11-12 DIAGNOSIS — R079 Chest pain, unspecified: Secondary | ICD-10-CM | POA: Insufficient documentation

## 2021-11-12 DIAGNOSIS — D75839 Thrombocytosis, unspecified: Secondary | ICD-10-CM | POA: Insufficient documentation

## 2021-11-12 DIAGNOSIS — Z882 Allergy status to sulfonamides status: Secondary | ICD-10-CM | POA: Insufficient documentation

## 2021-11-12 DIAGNOSIS — Z886 Allergy status to analgesic agent status: Secondary | ICD-10-CM | POA: Diagnosis not present

## 2021-11-12 LAB — CBC WITH DIFFERENTIAL/PLATELET
Abs Immature Granulocytes: 0.03 10*3/uL (ref 0.00–0.07)
Basophils Absolute: 0.1 10*3/uL (ref 0.0–0.1)
Basophils Relative: 1 %
Eosinophils Absolute: 0.1 10*3/uL (ref 0.0–0.5)
Eosinophils Relative: 2 %
HCT: 24.1 % — ABNORMAL LOW (ref 39.0–52.0)
Hemoglobin: 8 g/dL — ABNORMAL LOW (ref 13.0–17.0)
Immature Granulocytes: 1 %
Lymphocytes Relative: 37 %
Lymphs Abs: 2.5 10*3/uL (ref 0.7–4.0)
MCH: 32.8 pg (ref 26.0–34.0)
MCHC: 33.2 g/dL (ref 30.0–36.0)
MCV: 98.8 fL (ref 80.0–100.0)
Monocytes Absolute: 0.8 10*3/uL (ref 0.1–1.0)
Monocytes Relative: 11 %
Neutro Abs: 3.3 10*3/uL (ref 1.7–7.7)
Neutrophils Relative %: 48 %
Platelets: 499 10*3/uL — ABNORMAL HIGH (ref 150–400)
RBC: 2.44 MIL/uL — ABNORMAL LOW (ref 4.22–5.81)
RDW: 27.8 % — ABNORMAL HIGH (ref 11.5–15.5)
WBC: 6.7 10*3/uL (ref 4.0–10.5)
nRBC: 1.1 % — ABNORMAL HIGH (ref 0.0–0.2)

## 2021-11-12 LAB — SAMPLE TO BLOOD BANK

## 2021-11-12 NOTE — Patient Instructions (Addendum)
[place] at [facility] ?Discharge Instructions ? ?You were seen and examined today by Dr. Delton Coombes. He reviewed your most recent labs and bone marrow biopsy results. 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 Please keep follow up appointments as scheduled in 2 weeks. ? ? ?Thank you for choosing [place] at [facility] to provide your oncology and hematology care.  To afford each patient quality time with our provider, please arrive at least 15 minutes before your scheduled appointment time.  ? ?If you have a lab appointment with the [place] please come in thru the Main Entrance and check in at the main information desk. ? ?You need to re-schedule your appointment should you arrive 10 or more minutes late.  We strive to give you quality time with our providers, and arriving late affects you and other patients whose appointments are after yours.  Also, if you no show three or more times for appointments you may be dismissed from the clinic at the providers discretion.     ?Again, thank you for choosing [facility].  Our hope is that these requests will decrease the amount of time that you wait before being seen by our physicians.       ?_____________________________________________________________ ? ?Should you have questions after your visit to [facility], please contact our office at [phone] and follow the prompts.  Our office hours are 8:00 a.m. and 4:30 p.m. Monday - Friday.  Please note that voicemails left after 4:00 p.m. may not be returned until the following business day.  We are closed weekends and major holidays.  You do have access to a nurse 24-7, just call the main number to the clinic [phone] and do not press any options, hold on the line and a nurse will answer the phone.   ? ?For prescription refill requests, have your pharmacy contact our office and allow 72 hours.   ? ?Due to Covid, you will  need to wear a mask upon entering the hospital. If you do not have a mask, a mask will be given to you at the Main Entrance upon arrival. For doctor visits, patients may have 1 support person age 53 or older with them. For treatment visits, patients can not have anyone with them due to social distancing guidelines and our immunocompromised population.  ? ?  ?

## 2021-11-12 NOTE — Progress Notes (Signed)
? ?[facility] ?[address] ?[place], [place] [address] ? ? ?CLINIC:  ?Medical Oncology/Hematology ? ?PCP:  ?Danie Chandler, PA ?[address] [place] [address]  ?[phone] ? ?REASON FOR VISIT:  ?Follow-up for normocytic anemia ? ?PRIOR THERAPY: none ? ?CURRENT THERAPY: 2 iron tablets daily ? ?INTERVAL HISTORY:  ?[name], a 61 y.o. male, returns for routine follow-up for his normocytic anemia. Zamere was last seen on 09/11/2021. ? ?Today he reports feeling good. He reports occasional fatigue. He denies CP. He reports right sided abdominal pain. He denies current bleeding.  ? ?REVIEW OF SYSTEMS:  ?Review of Systems  ?Constitutional:  Positive for fatigue (occasional). Negative for appetite change.  ?HENT:   Negative for nosebleeds.   ?Respiratory:  Negative for hemoptysis.   ?Cardiovascular:  Positive for chest pain.  ?Gastrointestinal:  Positive for abdominal pain (3/10 R side). Negative for blood in stool.  ?Genitourinary:  Negative for hematuria.   ?All other systems reviewed and are negative. ? ?PAST MEDICAL/SURGICAL HISTORY:  ?No past medical history on file. ?No past surgical history on file. ? ?SOCIAL HISTORY:  ?Social History  ? ?Socioeconomic History  ? Marital status: Married  ?  Spouse name: Not on file  ? Number of children: Not on file  ? Years of education: Not on file  ? Highest education level: Not on file  ?Occupational History  ? Not on file  ?Tobacco Use  ? Smoking status: Not on file  ? Smokeless tobacco: Not on file  ?Substance and Sexual Activity  ? Alcohol use: Not on file  ? Drug use: Not on file  ? Sexual activity: Not on file  ?Other Topics Concern  ? Not on file  ?Social History Narrative  ? Not on file  ? ?Social Determinants of Health  ? ?Financial Resource Strain: Not on file  ?Food Insecurity: Not on file  ?Transportation Needs: Not on file  ?Physical Activity: Not on file  ?Stress: Not on file  ?Social Connections: Not on file  ?Intimate Partner Violence:  Not on file  ? ? ?FAMILY HISTORY:  ?No family history on file. ? ?CURRENT MEDICATIONS:  ?Current Outpatient Medications  ?Medication Sig Dispense Refill  ? amLODipine (NORVASC) 5 MG tablet Take 5 mg by mouth daily.    ? lisinopril-hydrochlorothiazide (ZESTORETIC) 20-25 MG tablet Take 1 tablet by mouth daily.    ? metFORMIN (GLUCOPHAGE) 1000 MG tablet Take 1,000 mg by mouth 2 (two) times daily.    ? Olmesartan-amLODIPine-HCTZ 40-10-25 MG TABS Take 1 tablet by mouth daily.    ? sildenafil (VIAGRA) 50 MG tablet Take 50 mg by mouth daily as needed.    ? ?No current facility-administered medications for this visit.  ? ? ?ALLERGIES:  ?Allergies  ?Allergen Reactions  ? Ibuprofen Hives  ? Sulfa Antibiotics Rash  ? ? ?PHYSICAL EXAM:  ?Performance status (ECOG): 0 - Asymptomatic ? ?Vitals:  ? 11/12/21 1459  ?BP: 136/67  ?Pulse: 78  ?Resp: 18  ?Temp: 98.7 ?F (37.1 ?C)  ?SpO2: 100%  ? ?Wt Readings from Last 3 Encounters:  ?11/12/21 230 lb (104.3 kg)  ?10/08/21 221 lb (100.2 kg)  ?09/11/21 223 lb 12.8 oz (101.5 kg)  ? ?Physical Exam ?Vitals reviewed.  ?Constitutional:   ?   Appearance: Normal appearance. He is obese.  ?Cardiovascular:  ?   Rate and Rhythm: Normal rate and regular rhythm.  ?   Pulses: Normal pulses.  ?   Heart sounds: Normal heart sounds.  ?  Pulmonary:  ?   Effort: Pulmonary effort is normal.  ?   Breath sounds: Normal breath sounds.  ?Neurological:  ?   General: No focal deficit present.  ?   Mental Status: He is alert and oriented to person, place, and time.  ?Psychiatric:     ?   Mood and Affect: Mood normal.     ?   Behavior: Behavior normal.  ? ? ?LABORATORY DATA:  ?I have reviewed the labs as listed.  ? ?  Latest Ref Rng & Units 10/08/2021  ?  7:50 AM 09/14/2021  ?  8:17 AM 09/11/2021  ? 12:49 PM  ?CBC  ?WBC 4.0 - 10.5 K/uL 6.1   8.2   6.6    ?Hemoglobin 13.0 - 17.0 g/dL 8.2   6.3   6.7    ?Hematocrit 39.0 - 52.0 % 24.6   19.4   20.6    ?Platelets 150 - 400 K/uL 570   620   645    ? ? ?  Latest Ref Rng & Units  09/11/2021  ? 12:49 PM  ?CMP  ?Glucose 70 - 99 mg/dL 175    ?BUN 8 - 23 mg/dL 15    ?Creatinine 0.61 - 1.24 mg/dL 1.00    ?Sodium 135 - 145 mmol/L 137    ?Potassium 3.5 - 5.1 mmol/L 3.7    ?Chloride 98 - 111 mmol/L 103    ?CO2 22 - 32 mmol/L 25    ?Calcium 8.9 - 10.3 mg/dL 9.6    ?Total Protein 6.5 - 8.1 g/dL 8.2    ?Total Bilirubin 0.3 - 1.2 mg/dL 1.0    ?Alkaline Phos 38 - 126 U/L 53    ?AST 15 - 41 U/L 23    ?ALT 0 - 44 U/L 22    ? ?   ?Component Value Date/Time  ? RBC 2.50 (L) 10/08/2021 0750  ? MCV 98.4 10/08/2021 0750  ? MCH 32.8 10/08/2021 0750  ? MCHC 33.3 10/08/2021 0750  ? RDW 26.7 (H) 10/08/2021 0750  ? LYMPHSABS 1.6 10/08/2021 0750  ? MONOABS 0.7 10/08/2021 0750  ? EOSABS 0.1 10/08/2021 0750  ? BASOSABS 0.1 10/08/2021 0750  ? ? ?DIAGNOSTIC IMAGING:  ?I have independently reviewed the scans and discussed with the patient. ?No results found.  ? ?ASSESSMENT:  ?Normocytic anemia and thrombocytosis: ?- Patient seen for normocytic anemia with CBC on 05/08/2021 showing hemoglobin 8.4, MCV 97 and platelet count 631. ?- Ferritin was 1454, percent saturation was 85. ?- Denies any prior transfusion history.  Denies any parenteral iron therapy. ?- He is taking iron tablet 2 times daily for the last 1 year. ?- Colonoscopy was reportedly done in [place] 3 years ago, within normal limits. ?- He denies any bleeding per rectum or melena.  No ice pica. ?- I have reviewed labs from 2019 from [facility] when his hemoglobin was between 8-9 and platelet count elevated. ?- He denies any fevers or night sweats.  Reports 30 pound weight loss in the last 6 months since he was started on metformin (diarrhea). ? ?  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 ?- Denies any smoking history. ?- Paternal aunt had pancreatic cancer.  Sister had breast cancer. ? ? ?PLAN:  ?MDS with ring sideroblasts and thrombocytosis: ?- He has normocytic anemia requiring transfusions.  JAK2 V617F and  reflex testing is negative for thrombocytosis. ?-We reviewed recent bone marrow biopsy results. ?- Marrow was suggestive  of hypercellular marrow with erythroid hyperplasia, erythroid dysplasia and ring sideroblasts.  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 ?- Chromosome analysis was 24, XY. 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 ?- We have used IPSS M risk ratification.  Score is -0.73 (low risk). IPSS R score was 2.5.  Median leukemia free survival was 5.9 years.  Median overall survival was 6 years.  AML transformation rate was 1.7% x 1 year. ?- We will ask our pathologist to quantitate ring sideroblasts percent. ?- We will ask MDS FISH panel on bone marrow. ?- We will check a baseline EPO level.  If it is less than 2 50-500, will initiate erythropoiesis stimulating agents like Retacrit/Procrit. ?- We will also check BCR/ABL by FISH to complete work-up for thrombocytosis. ?- RTC 2 weeks to initiate erythropoiesis stimulating agents. ? ?2.  Elevated ferritin (hemochromatosis negative): ?-His last ferritin was 1070. ?- We discussed hemochromatosis panel which was negative. ? ? ? ?Orders placed this encounter:  ?No orders of the defined types were placed in this encounter. ? ? ? ?Derek Jack, MD ?[place] ?[phone] ? ? ?I, Thana Ates, am acting as a scribe for Dr. Derek Jack. ? ?I, Derek Jack MD, have reviewed the above documentation for accuracy and completeness, and I agree with the above. ?  ? ? ?

## 2021-11-14 ENCOUNTER — Encounter (HOSPITAL_COMMUNITY): Payer: Self-pay | Admitting: Hematology

## 2021-11-14 DIAGNOSIS — D649 Anemia, unspecified: Secondary | ICD-10-CM | POA: Insufficient documentation

## 2021-11-14 DIAGNOSIS — D469 Myelodysplastic syndrome, unspecified: Secondary | ICD-10-CM | POA: Insufficient documentation

## 2021-11-14 LAB — ERYTHROPOIETIN: Erythropoietin: 107.9 m[IU]/mL — ABNORMAL HIGH (ref 2.6–18.5)

## 2021-11-17 LAB — BCR-ABL1 FISH
Cells Analyzed: 200
Cells Counted: 200

## 2021-11-23 LAB — SURGICAL PATHOLOGY

## 2021-11-25 ENCOUNTER — Other Ambulatory Visit (HOSPITAL_COMMUNITY): Payer: Self-pay

## 2021-11-25 DIAGNOSIS — D649 Anemia, unspecified: Secondary | ICD-10-CM

## 2021-11-26 ENCOUNTER — Inpatient Hospital Stay (HOSPITAL_BASED_OUTPATIENT_CLINIC_OR_DEPARTMENT_OTHER): Payer: 59 | Admitting: Hematology

## 2021-11-26 ENCOUNTER — Inpatient Hospital Stay (HOSPITAL_COMMUNITY): Payer: 59

## 2021-11-26 ENCOUNTER — Other Ambulatory Visit (HOSPITAL_COMMUNITY): Payer: Self-pay | Admitting: *Deleted

## 2021-11-26 VITALS — BP 126/60 | HR 83 | Temp 97.2°F | Resp 20 | Ht 67.72 in | Wt 229.3 lb

## 2021-11-26 DIAGNOSIS — D469 Myelodysplastic syndrome, unspecified: Secondary | ICD-10-CM

## 2021-11-26 DIAGNOSIS — D649 Anemia, unspecified: Secondary | ICD-10-CM | POA: Diagnosis not present

## 2021-11-26 LAB — CBC
HCT: 23.1 % — ABNORMAL LOW (ref 39.0–52.0)
Hemoglobin: 7.8 g/dL — ABNORMAL LOW (ref 13.0–17.0)
MCH: 32.8 pg (ref 26.0–34.0)
MCHC: 33.8 g/dL (ref 30.0–36.0)
MCV: 97.1 fL (ref 80.0–100.0)
Platelets: 544 10*3/uL — ABNORMAL HIGH (ref 150–400)
RBC: 2.38 MIL/uL — ABNORMAL LOW (ref 4.22–5.81)
RDW: 28.6 % — ABNORMAL HIGH (ref 11.5–15.5)
WBC: 5.4 10*3/uL (ref 4.0–10.5)
nRBC: 0.6 % — ABNORMAL HIGH (ref 0.0–0.2)

## 2021-11-26 MED ORDER — EPOETIN ALFA-EPBX 40000 UNIT/ML IJ SOLN: 30000.0000 [IU] | Freq: Once | INTRAMUSCULAR | Status: DC

## 2021-11-26 MED ORDER — EPOETIN ALFA-EPBX 20000 UNIT/ML IJ SOLN
20000.0000 [IU] | Freq: Once | INTRAMUSCULAR | Status: AC
  Administered 2021-11-26: 20000 [IU] via INTRAVENOUS
  Filled 2021-11-26: qty 1

## 2021-11-26 MED ORDER — EPOETIN ALFA-EPBX 10000 UNIT/ML IJ SOLN
10000.0000 [IU] | Freq: Once | INTRAMUSCULAR | Status: AC
  Administered 2021-11-26: 10000 [IU] via SUBCUTANEOUS
  Filled 2021-11-26: qty 1

## 2021-11-26 NOTE — Patient Instructions (Addendum)
[place] at [facility] ?Discharge Instructions ? ? ?You were seen and examined today by Dr. Delton Coombes. ? ?He reviewed your lab work.  Your hemoglobin is low. We will initiate Retacrit injections today to help get your hemoglobin up.  We will give these shots weekly for now. ? ?Return as scheduled.  ? ? ?Thank you for choosing [place] at [facility] to provide your oncology and hematology care.  To afford each patient quality time with our provider, please arrive at least 15 minutes before your scheduled appointment time.  ? ?If you have a lab appointment with the [facility] please come in thru the Main Entrance and check in at the main information desk. ? ?You need to re-schedule your appointment should you arrive 10 or more minutes late.  We strive to give you quality time with our providers, and arriving late affects you and other patients whose appointments are after yours.  Also, if you no show three or more times for appointments you may be dismissed from the clinic at the providers discretion.     ?Again, thank you for choosing [facility].  Our hope is that these requests will decrease the amount of time that you wait before being seen by our physicians.       ?_____________________________________________________________ ? ?Should you have questions after your visit to [facility], please contact our office at [phone] and follow the prompts.  Our office hours are 8:00 a.m. and 4:30 p.m. Monday - Friday.  Please note that voicemails left after 4:00 p.m. may not be returned until the following business day.  We are closed weekends and major holidays.  You do have access to a nurse 24-7, just call the main number to the clinic [phone] and do not press any options, hold on the line and a nurse will answer the phone.   ? ?For prescription refill requests, have your pharmacy contact our office and allow 72 hours.   ? ?Due  to Covid, you will need to wear a mask upon entering the hospital. If you do not have a mask, a mask will be given to you at the Main Entrance upon arrival. For doctor visits, patients may have 1 support person age 62 or older with them. For treatment visits, patients can not have anyone with them due to social distancing guidelines and our immunocompromised population.  ? ?   ?

## 2021-11-26 NOTE — Progress Notes (Signed)
? ?[facility] ?[address] ?[place], [place] [address] ? ? ?CLINIC:  ?Medical Oncology/Hematology ? ?PCP:  ?Danie Chandler, PA ?[address] [place] [address]  ?[phone] ? ?REASON FOR VISIT:  ?Follow-up for normocytic anemia ? ?PRIOR THERAPY: none ? ?CURRENT THERAPY: 2 iron tablets daily ? ?INTERVAL HISTORY:  ?Stephen Watts, a 61 y.o. male, returns for routine follow-up for his normocytic anemia. Stephen Watts was last seen on 11/14/2021. ? ?Today he reports feeling good.  ? ?REVIEW OF SYSTEMS:  ?Review of Systems  ?Constitutional:  Negative for appetite change and fatigue.  ?Neurological:  Positive for numbness.  ?Psychiatric/Behavioral:  Positive for sleep disturbance.   ?All other systems reviewed and are negative. ? ?PAST MEDICAL/SURGICAL HISTORY:  ?No past medical history on file. ?No past surgical history on file. ? ?SOCIAL HISTORY:  ?Social History  ? ?Socioeconomic History  ? Marital status: Married  ?  Spouse name: Not on file  ? Number of children: Not on file  ? Years of education: Not on file  ? Highest education level: Not on file  ?Occupational History  ? Not on file  ?Tobacco Use  ? Smoking status: Not on file  ? Smokeless tobacco: Not on file  ?Substance and Sexual Activity  ? Alcohol use: Not on file  ? Drug use: Not on file  ? Sexual activity: Not on file  ?Other Topics Concern  ? Not on file  ?Social History Narrative  ? Not on file  ? ?Social Determinants of Health  ? ?Financial Resource Strain: Not on file  ?Food Insecurity: Not on file  ?Transportation Needs: Not on file  ?Physical Activity: Not on file  ?Stress: Not on file  ?Social Connections: Not on file  ?Intimate Partner Violence: Not on file  ? ? ?FAMILY HISTORY:  ?No family history on file. ? ?CURRENT MEDICATIONS:  ?Current Outpatient Medications  ?Medication Sig Dispense Refill  ? amLODipine (NORVASC) 5 MG tablet Take 5 mg by mouth daily.    ? lisinopril-hydrochlorothiazide (ZESTORETIC) 20-25 MG tablet Take 1 tablet  by mouth daily.    ? metFORMIN (GLUCOPHAGE) 1000 MG tablet Take 1,000 mg by mouth 2 (two) times daily.    ? Olmesartan-amLODIPine-HCTZ 40-10-25 MG TABS Take 1 tablet by mouth daily.    ? sildenafil (VIAGRA) 50 MG tablet Take 50 mg by mouth daily as needed.    ? ?No current facility-administered medications for this visit.  ? ? ?ALLERGIES:  ?Allergies  ?Allergen Reactions  ? Ibuprofen Hives  ? Sulfa Antibiotics Rash  ? ? ?PHYSICAL EXAM:  ?Performance status (ECOG): 0 - Asymptomatic ? ?There were no vitals filed for this visit. ?Wt Readings from Last 3 Encounters:  ?11/12/21 230 lb (104.3 kg)  ?10/08/21 221 lb (100.2 kg)  ?09/11/21 223 lb 12.8 oz (101.5 kg)  ? ?Physical Exam ?Vitals reviewed.  ?Constitutional:   ?   Appearance: Normal appearance.  ?Cardiovascular:  ?   Rate and Rhythm: Normal rate and regular rhythm.  ?   Pulses: Normal pulses.  ?   Heart sounds: Normal heart sounds.  ?Pulmonary:  ?   Effort: Pulmonary effort is normal.  ?   Breath sounds: Normal breath sounds.  ?Neurological:  ?   General: No focal deficit present.  ?   Mental Status: He is alert and oriented to person, place, and time.  ?Psychiatric:     ?   Mood and Affect: Mood normal.     ?   Behavior:  Behavior normal.  ? ? ?LABORATORY DATA:  ?I have reviewed the labs as listed.  ? ?  Latest Ref Rng & Units 11/12/2021  ?  3:31 PM 10/08/2021  ?  7:50 AM 09/14/2021  ?  8:17 AM  ?CBC  ?WBC 4.0 - 10.5 K/uL 6.7   6.1   8.2    ?Hemoglobin 13.0 - 17.0 g/dL 8.0   8.2   6.3    ?Hematocrit 39.0 - 52.0 % 24.1   24.6   19.4    ?Platelets 150 - 400 K/uL 499   570   620    ? ? ?  Latest Ref Rng & Units 09/11/2021  ? 12:49 PM  ?CMP  ?Glucose 70 - 99 mg/dL 175    ?BUN 8 - 23 mg/dL 15    ?Creatinine 0.61 - 1.24 mg/dL 1.00    ?Sodium 135 - 145 mmol/L 137    ?Potassium 3.5 - 5.1 mmol/L 3.7    ?Chloride 98 - 111 mmol/L 103    ?CO2 22 - 32 mmol/L 25    ?Calcium 8.9 - 10.3 mg/dL 9.6    ?Total Protein 6.5 - 8.1 g/dL 8.2    ?Total Bilirubin 0.3 - 1.2 mg/dL 1.0    ?Alkaline Phos  38 - 126 U/L 53    ?AST 15 - 41 U/L 23    ?ALT 0 - 44 U/L 22    ? ?   ?Component Value Date/Time  ? RBC 2.44 (L) 11/12/2021 1531  ? MCV 98.8 11/12/2021 1531  ? MCH 32.8 11/12/2021 1531  ? MCHC 33.2 11/12/2021 1531  ? RDW 27.8 (H) 11/12/2021 1531  ? LYMPHSABS 2.5 11/12/2021 1531  ? MONOABS 0.8 11/12/2021 1531  ? EOSABS 0.1 11/12/2021 1531  ? BASOSABS 0.1 11/12/2021 1531  ? ? ?DIAGNOSTIC IMAGING:  ?I have independently reviewed the scans and discussed with the patient. ?No results found.  ? ?ASSESSMENT:  ?MDS with ringed sideroblasts and thrombocytosis: ?- Patient seen for normocytic anemia with CBC on 05/08/2021 showing hemoglobin 8.4, MCV 97 and platelet count 631. ?- Ferritin was 1454, percent saturation was 85.  Hemochromatosis was negative. ?- He is taking iron tablet 2 times daily for the last 1 year. ?- Colonoscopy was reportedly done in [place] 3 years ago, within normal limits. ?- BMBX (10/08/2021): Hypercellular marrow with erythroid hyperplasia, erythroid dysplasia and ring sideroblasts.  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 ?- Chromosome analysis was 85, XY. 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 ?- IPSS M: Score is -0.73 (low risk).  IPSS-R score 2.5.  Median leukemia free survival was 5.9%.  Median overall survival was 6 years.  AML transformation rate was 1.7 %/year. ?- Ring sideroblasts were more than 15%.  JAK2 V617F and reflex testing and BCR/ABL by FISH were negative for thrombocytosis. ?- Serum EPO was 107. ? ?  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 ?- Denies any smoking history. ?- Paternal aunt had pancreatic cancer.  Sister had breast cancer. ? ? ?PLAN:  ?MDS with ring sideroblasts and thrombocytosis: ?- We discussed EPO level which was 107 indicating good response for thrombolysis stimulating agents. ?- We also discussed BCR/ABL result which was negative. ?- Recommend  proceeding with Retacrit 30,000 units weekly for severe anemia. ?- CBC today shows hemoglobin 7.8 with normal white count.  Platelet count is 544. ?- We discussed side effects of Retacrit in detail. ?- I  will see him back in 5 weeks with repeat labs and titrate the dose. ? ?2.  Elevated ferritin (hemochromatosis negative): ?- Ferritin was 1070 and percent saturation 85 on 09/11/2021.  Hemochromatosis panel was negative. ?- Check ferritin and iron panel in a month. ? ?Orders placed this encounter:  ?No orders of the defined types were placed in this encounter. ? ? ? ?Derek Jack, MD ?[place] ?[phone] ? ? ?I, Thana Ates, am acting as a scribe for Dr. Derek Jack. ? ?I, Derek Jack MD, have reviewed the above documentation for accuracy and completeness, and I agree with the above. ?  ? ? ?

## 2021-11-26 NOTE — Progress Notes (Signed)
Patient presents today for Retacrit injection. Patient okay to proceed today with treatment per Dr. Delton Coombes. ?Patient tolerated injection with no complaints voiced. Site clean and dry with no bruising or swelling noted at site. See MAR for details. Band aid applied.  Patient stable during and after injection. VSS with discharge and left in satisfactory condition with no s/s of distress noted.  ?

## 2021-11-26 NOTE — Patient Instructions (Signed)
[facility]  Discharge Instructions: Thank you for choosing [facility] to provide your oncology and hematology care.  If you have a lab appointment with the Cancer Center, please come in thru the Main Entrance and check in at the main information desk.  Wear comfortable clothing and clothing appropriate for easy access to any Portacath or PICC line.   We strive to give you quality time with your provider. You may need to reschedule your appointment if you arrive late (15 or more minutes).  Arriving late affects you and other patients whose appointments are after yours.  Also, if you miss three or more appointments without notifying the office, you may be dismissed from the clinic at the provider's discretion.      For prescription refill requests, have your pharmacy contact our office and allow 72 hours for refills to be completed.    Today you received the following Retacrit injection, return as scheduled.   To help prevent nausea and vomiting after your treatment, we encourage you to take your nausea medication as directed.  BELOW ARE SYMPTOMS THAT SHOULD BE REPORTED IMMEDIATELY: *FEVER GREATER THAN 100.4 F (38 C) OR HIGHER *CHILLS OR SWEATING *NAUSEA AND VOMITING THAT IS NOT CONTROLLED WITH YOUR NAUSEA MEDICATION *UNUSUAL SHORTNESS OF BREATH *UNUSUAL BRUISING OR BLEEDING *URINARY PROBLEMS (pain or burning when urinating, or frequent urination) *BOWEL PROBLEMS (unusual diarrhea, constipation, pain near the anus) TENDERNESS IN MOUTH AND THROAT WITH OR WITHOUT PRESENCE OF ULCERS (sore throat, sores in mouth, or a toothache) UNUSUAL RASH, SWELLING OR PAIN  UNUSUAL VAGINAL DISCHARGE OR ITCHING   Items with * indicate a potential emergency and should be followed up as soon as possible or go to the Emergency Department if any problems should occur.  Please show the CHEMOTHERAPY ALERT CARD or IMMUNOTHERAPY ALERT CARD at check-in to the Emergency Department and  triage nurse.  Should you have questions after your visit or need to cancel or reschedule your appointment, please contact [facility] [phone]  and follow the prompts.  Office hours are 8:00 a.m. to 4:30 p.m. Monday - Friday. Please note that voicemails left after 4:00 p.m. may not be returned until the following business day.  We are closed weekends and major holidays. You have access to a nurse at all times for urgent questions. Please call the main number to the clinic [phone] and follow the prompts.  For any non-urgent questions, you may also contact your provider using MyChart. We now offer e-Visits for anyone 18 and older to request care online for non-urgent symptoms. For details visit [email or web address].   Also download the MyChart app! Go to the app store, search "MyChart", open the app, select [facility], and log in with your MyChart username and password.  Due to Covid, a mask is required upon entering the hospital/clinic. If you do not have a mask, one will be given to you upon arrival. For doctor visits, patients may have 1 support person aged 18 or older with them. For treatment visits, patients cannot have anyone with them due to current Covid guidelines and our immunocompromised population.  

## 2021-12-03 ENCOUNTER — Inpatient Hospital Stay (HOSPITAL_COMMUNITY): Payer: 59 | Attending: Hematology

## 2021-12-03 ENCOUNTER — Inpatient Hospital Stay (HOSPITAL_COMMUNITY): Payer: 59

## 2021-12-03 VITALS — BP 146/74 | HR 74 | Temp 97.5°F | Resp 18

## 2021-12-03 DIAGNOSIS — D649 Anemia, unspecified: Secondary | ICD-10-CM

## 2021-12-03 DIAGNOSIS — D469 Myelodysplastic syndrome, unspecified: Secondary | ICD-10-CM | POA: Insufficient documentation

## 2021-12-03 DIAGNOSIS — Z79899 Other long term (current) drug therapy: Secondary | ICD-10-CM | POA: Insufficient documentation

## 2021-12-03 DIAGNOSIS — G479 Sleep disorder, unspecified: Secondary | ICD-10-CM | POA: Insufficient documentation

## 2021-12-03 DIAGNOSIS — R200 Anesthesia of skin: Secondary | ICD-10-CM | POA: Insufficient documentation

## 2021-12-03 LAB — CBC
HCT: 25.1 % — ABNORMAL LOW (ref 39.0–52.0)
Hemoglobin: 8.2 g/dL — ABNORMAL LOW (ref 13.0–17.0)
MCH: 32.5 pg (ref 26.0–34.0)
MCHC: 32.7 g/dL (ref 30.0–36.0)
MCV: 99.6 fL (ref 80.0–100.0)
Platelets: 540 10*3/uL — ABNORMAL HIGH (ref 150–400)
RBC: 2.52 MIL/uL — ABNORMAL LOW (ref 4.22–5.81)
RDW: 29.2 % — ABNORMAL HIGH (ref 11.5–15.5)
WBC: 7.7 10*3/uL (ref 4.0–10.5)
nRBC: 2.8 % — ABNORMAL HIGH (ref 0.0–0.2)

## 2021-12-03 MED ORDER — EPOETIN ALFA-EPBX 10000 UNIT/ML IJ SOLN
10000.0000 [IU] | Freq: Once | INTRAMUSCULAR | Status: AC
  Administered 2021-12-03: 10000 [IU] via SUBCUTANEOUS
  Filled 2021-12-03: qty 1

## 2021-12-03 MED ORDER — EPOETIN ALFA-EPBX 20000 UNIT/ML IJ SOLN
20000.0000 [IU] | Freq: Once | INTRAMUSCULAR | Status: AC
  Administered 2021-12-03: 20000 [IU] via INTRAVENOUS
  Filled 2021-12-03: qty 1

## 2021-12-03 NOTE — Progress Notes (Signed)
Patient presents today for Retacrit injection per providers order.  Hgb noted to be 8.3.  Vital signs WNL.  Patient ha no new complaints at this time.  Stable during administration without incident; injection site WNL; see MAR for injection details.  Patient tolerated procedure well and without incident.  No questions or complaints noted at this time.  ?

## 2021-12-03 NOTE — Patient Instructions (Signed)
[facility]  Discharge Instructions: Thank you for choosing [facility] to provide your oncology and hematology care.  If you have a lab appointment with the Cancer Center, please come in thru the Main Entrance and check in at the main information desk.  Wear comfortable clothing and clothing appropriate for easy access to any Portacath or PICC line.   We strive to give you quality time with your provider. You may need to reschedule your appointment if you arrive late (15 or more minutes).  Arriving late affects you and other patients whose appointments are after yours.  Also, if you miss three or more appointments without notifying the office, you may be dismissed from the clinic at the provider's discretion.      For prescription refill requests, have your pharmacy contact our office and allow 72 hours for refills to be completed.    Today you received the following chemotherapy and/or immunotherapy agents Retacrit      To help prevent nausea and vomiting after your treatment, we encourage you to take your nausea medication as directed.  BELOW ARE SYMPTOMS THAT SHOULD BE REPORTED IMMEDIATELY: *FEVER GREATER THAN 100.4 F (38 C) OR HIGHER *CHILLS OR SWEATING *NAUSEA AND VOMITING THAT IS NOT CONTROLLED WITH YOUR NAUSEA MEDICATION *UNUSUAL SHORTNESS OF BREATH *UNUSUAL BRUISING OR BLEEDING *URINARY PROBLEMS (pain or burning when urinating, or frequent urination) *BOWEL PROBLEMS (unusual diarrhea, constipation, pain near the anus) TENDERNESS IN MOUTH AND THROAT WITH OR WITHOUT PRESENCE OF ULCERS (sore throat, sores in mouth, or a toothache) UNUSUAL RASH, SWELLING OR PAIN  UNUSUAL VAGINAL DISCHARGE OR ITCHING   Items with * indicate a potential emergency and should be followed up as soon as possible or go to the Emergency Department if any problems should occur.  Please show the CHEMOTHERAPY ALERT CARD or IMMUNOTHERAPY ALERT CARD at check-in to the Emergency  Department and triage nurse.  Should you have questions after your visit or need to cancel or reschedule your appointment, please contact [facility] [phone]  and follow the prompts.  Office hours are 8:00 a.m. to 4:30 p.m. Monday - Friday. Please note that voicemails left after 4:00 p.m. may not be returned until the following business day.  We are closed weekends and major holidays. You have access to a nurse at all times for urgent questions. Please call the main number to the clinic [phone] and follow the prompts.  For any non-urgent questions, you may also contact your provider using MyChart. We now offer e-Visits for anyone 18 and older to request care online for non-urgent symptoms. For details visit [email or web address].   Also download the MyChart app! Go to the app store, search "MyChart", open the app, select [facility], and log in with your MyChart username and password.  Due to Covid, a mask is required upon entering the hospital/clinic. If you do not have a mask, one will be given to you upon arrival. For doctor visits, patients may have 1 support person aged 18 or older with them. For treatment visits, patients cannot have anyone with them due to current Covid guidelines and our immunocompromised population.  

## 2021-12-10 ENCOUNTER — Inpatient Hospital Stay (HOSPITAL_COMMUNITY): Payer: 59 | Attending: Hematology

## 2021-12-10 ENCOUNTER — Inpatient Hospital Stay (HOSPITAL_COMMUNITY): Payer: 59

## 2021-12-10 VITALS — BP 135/62 | HR 78 | Temp 97.5°F | Resp 18

## 2021-12-10 DIAGNOSIS — Z79899 Other long term (current) drug therapy: Secondary | ICD-10-CM | POA: Insufficient documentation

## 2021-12-10 DIAGNOSIS — D649 Anemia, unspecified: Secondary | ICD-10-CM | POA: Insufficient documentation

## 2021-12-10 DIAGNOSIS — D469 Myelodysplastic syndrome, unspecified: Secondary | ICD-10-CM | POA: Diagnosis not present

## 2021-12-10 LAB — CBC
HCT: 30 % — ABNORMAL LOW (ref 39.0–52.0)
Hemoglobin: 9.8 g/dL — ABNORMAL LOW (ref 13.0–17.0)
MCH: 33 pg (ref 26.0–34.0)
MCHC: 32.7 g/dL (ref 30.0–36.0)
MCV: 101 fL — ABNORMAL HIGH (ref 80.0–100.0)
Platelets: 481 10*3/uL — ABNORMAL HIGH (ref 150–400)
RBC: 2.97 MIL/uL — ABNORMAL LOW (ref 4.22–5.81)
RDW: 29.4 % — ABNORMAL HIGH (ref 11.5–15.5)
WBC: 4.3 10*3/uL (ref 4.0–10.5)
nRBC: 0.7 % — ABNORMAL HIGH (ref 0.0–0.2)

## 2021-12-10 MED ORDER — EPOETIN ALFA-EPBX 20000 UNIT/ML IJ SOLN
20000.0000 [IU] | Freq: Once | INTRAMUSCULAR | Status: AC
  Administered 2021-12-10: 20000 [IU] via INTRAVENOUS
  Filled 2021-12-10: qty 1

## 2021-12-10 MED ORDER — EPOETIN ALFA-EPBX 10000 UNIT/ML IJ SOLN
10000.0000 [IU] | Freq: Once | INTRAMUSCULAR | Status: AC
  Administered 2021-12-10: 10000 [IU] via SUBCUTANEOUS
  Filled 2021-12-10: qty 1

## 2021-12-10 NOTE — Patient Instructions (Signed)
[facility]  Discharge Instructions: Thank you for choosing [facility] to provide your oncology and hematology care.  If you have a lab appointment with the Cancer Center, please come in thru the Main Entrance and check in at the main information desk.  Wear comfortable clothing and clothing appropriate for easy access to any Portacath or PICC line.   We strive to give you quality time with your provider. You may need to reschedule your appointment if you arrive late (15 or more minutes).  Arriving late affects you and other patients whose appointments are after yours.  Also, if you miss three or more appointments without notifying the office, you may be dismissed from the clinic at the provider's discretion.      For prescription refill requests, have your pharmacy contact our office and allow 72 hours for refills to be completed.        To help prevent nausea and vomiting after your treatment, we encourage you to take your nausea medication as directed.  BELOW ARE SYMPTOMS THAT SHOULD BE REPORTED IMMEDIATELY: *FEVER GREATER THAN 100.4 F (38 C) OR HIGHER *CHILLS OR SWEATING *NAUSEA AND VOMITING THAT IS NOT CONTROLLED WITH YOUR NAUSEA MEDICATION *UNUSUAL SHORTNESS OF BREATH *UNUSUAL BRUISING OR BLEEDING *URINARY PROBLEMS (pain or burning when urinating, or frequent urination) *BOWEL PROBLEMS (unusual diarrhea, constipation, pain near the anus) TENDERNESS IN MOUTH AND THROAT WITH OR WITHOUT PRESENCE OF ULCERS (sore throat, sores in mouth, or a toothache) UNUSUAL RASH, SWELLING OR PAIN  UNUSUAL VAGINAL DISCHARGE OR ITCHING   Items with * indicate a potential emergency and should be followed up as soon as possible or go to the Emergency Department if any problems should occur.  Please show the CHEMOTHERAPY ALERT CARD or IMMUNOTHERAPY ALERT CARD at check-in to the Emergency Department and triage nurse.  Should you have questions after your visit or need to cancel  or reschedule your appointment, please contact [facility] [phone]  and follow the prompts.  Office hours are 8:00 a.m. to 4:30 p.m. Monday - Friday. Please note that voicemails left after 4:00 p.m. may not be returned until the following business day.  We are closed weekends and major holidays. You have access to a nurse at all times for urgent questions. Please call the main number to the clinic [phone] and follow the prompts.  For any non-urgent questions, you may also contact your provider using MyChart. We now offer e-Visits for anyone 18 and older to request care online for non-urgent symptoms. For details visit [email or web address].   Also download the MyChart app! Go to the app store, search "MyChart", open the app, select [facility], and log in with your MyChart username and password.  Due to Covid, a mask is required upon entering the hospital/clinic. If you do not have a mask, one will be given to you upon arrival. For doctor visits, patients may have 1 support person aged 18 or older with them. For treatment visits, patients cannot have anyone with them due to current Covid guidelines and our immunocompromised population.  

## 2021-12-10 NOTE — Progress Notes (Signed)
Patient presents today for Retacrit injection. Hemoglobin reviewed prior to administration. VSS. Injection tolerated without incident or complaint. See MAR for details. Patient stable during and after injection.  Patient discharged in satisfactory condition with no s/s of distress noted.    

## 2021-12-17 ENCOUNTER — Inpatient Hospital Stay (HOSPITAL_COMMUNITY): Payer: 59

## 2021-12-17 ENCOUNTER — Encounter (HOSPITAL_COMMUNITY): Payer: Self-pay

## 2021-12-17 VITALS — BP 153/72 | HR 81 | Resp 18

## 2021-12-17 DIAGNOSIS — D649 Anemia, unspecified: Secondary | ICD-10-CM

## 2021-12-17 DIAGNOSIS — D469 Myelodysplastic syndrome, unspecified: Secondary | ICD-10-CM | POA: Diagnosis not present

## 2021-12-17 LAB — CBC
HCT: 24.9 % — ABNORMAL LOW (ref 39.0–52.0)
Hemoglobin: 8.2 g/dL — ABNORMAL LOW (ref 13.0–17.0)
MCH: 32.7 pg (ref 26.0–34.0)
MCHC: 32.9 g/dL (ref 30.0–36.0)
MCV: 99.2 fL (ref 80.0–100.0)
Platelets: 475 10*3/uL — ABNORMAL HIGH (ref 150–400)
RBC: 2.51 MIL/uL — ABNORMAL LOW (ref 4.22–5.81)
RDW: 29 % — ABNORMAL HIGH (ref 11.5–15.5)
WBC: 4.9 10*3/uL (ref 4.0–10.5)
nRBC: 0.4 % — ABNORMAL HIGH (ref 0.0–0.2)

## 2021-12-17 MED ORDER — EPOETIN ALFA-EPBX 20000 UNIT/ML IJ SOLN
20000.0000 [IU] | Freq: Once | INTRAMUSCULAR | Status: AC
  Administered 2021-12-17: 20000 [IU] via INTRAVENOUS
  Filled 2021-12-17: qty 1

## 2021-12-17 MED ORDER — EPOETIN ALFA-EPBX 10000 UNIT/ML IJ SOLN
10000.0000 [IU] | Freq: Once | INTRAMUSCULAR | Status: AC
  Administered 2021-12-17: 10000 [IU] via SUBCUTANEOUS
  Filled 2021-12-17: qty 1

## 2021-12-17 NOTE — Patient Instructions (Signed)
[facility]  Discharge Instructions: Thank you for choosing [facility] to provide your oncology and hematology care.  If you have a lab appointment with the Cancer Center, please come in thru the Main Entrance and check in at the main information desk.  Wear comfortable clothing and clothing appropriate for easy access to any Portacath or PICC line.   We strive to give you quality time with your provider. You may need to reschedule your appointment if you arrive late (15 or more minutes).  Arriving late affects you and other patients whose appointments are after yours.  Also, if you miss three or more appointments without notifying the office, you may be dismissed from the clinic at the provider's discretion.      For prescription refill requests, have your pharmacy contact our office and allow 72 hours for refills to be completed.    Today you received the following chemotherapy and/or immunotherapy agents Retacrit injection      To help prevent nausea and vomiting after your treatment, we encourage you to take your nausea medication as directed.  BELOW ARE SYMPTOMS THAT SHOULD BE REPORTED IMMEDIATELY: *FEVER GREATER THAN 100.4 F (38 C) OR HIGHER *CHILLS OR SWEATING *NAUSEA AND VOMITING THAT IS NOT CONTROLLED WITH YOUR NAUSEA MEDICATION *UNUSUAL SHORTNESS OF BREATH *UNUSUAL BRUISING OR BLEEDING *URINARY PROBLEMS (pain or burning when urinating, or frequent urination) *BOWEL PROBLEMS (unusual diarrhea, constipation, pain near the anus) TENDERNESS IN MOUTH AND THROAT WITH OR WITHOUT PRESENCE OF ULCERS (sore throat, sores in mouth, or a toothache) UNUSUAL RASH, SWELLING OR PAIN  UNUSUAL VAGINAL DISCHARGE OR ITCHING   Items with * indicate a potential emergency and should be followed up as soon as possible or go to the Emergency Department if any problems should occur.  Please show the CHEMOTHERAPY ALERT CARD or IMMUNOTHERAPY ALERT CARD at check-in to the  Emergency Department and triage nurse.  Should you have questions after your visit or need to cancel or reschedule your appointment, please contact [facility] [phone]  and follow the prompts.  Office hours are 8:00 a.m. to 4:30 p.m. Monday - Friday. Please note that voicemails left after 4:00 p.m. may not be returned until the following business day.  We are closed weekends and major holidays. You have access to a nurse at all times for urgent questions. Please call the main number to the clinic [phone] and follow the prompts.  For any non-urgent questions, you may also contact your provider using MyChart. We now offer e-Visits for anyone 18 and older to request care online for non-urgent symptoms. For details visit [email or web address].   Also download the MyChart app! Go to the app store, search "MyChart", open the app, select [facility], and log in with your MyChart username and password.  Due to Covid, a mask is required upon entering the hospital/clinic. If you do not have a mask, one will be given to you upon arrival. For doctor visits, patients may have 1 support person aged 18 or older with them. For treatment visits, patients cannot have anyone with them due to current Covid guidelines and our immunocompromised population.  

## 2021-12-17 NOTE — Progress Notes (Signed)
Kaya Klausing presents today for injection per the provider's orders.  Retacrit injection 30,000 administration without incident; injection site WNL; see MAR for injection details.  Patient tolerated procedure well and without incident.  No questions or complaints noted at this time. Discharged from clinic ambulatory in stable condition. Alert and oriented x 3. F/U with [facility] as scheduled.   ?

## 2021-12-24 ENCOUNTER — Inpatient Hospital Stay (HOSPITAL_COMMUNITY): Payer: 59

## 2021-12-31 ENCOUNTER — Inpatient Hospital Stay (HOSPITAL_BASED_OUTPATIENT_CLINIC_OR_DEPARTMENT_OTHER): Payer: 59 | Admitting: Hematology

## 2021-12-31 ENCOUNTER — Inpatient Hospital Stay (HOSPITAL_COMMUNITY): Payer: 59

## 2021-12-31 VITALS — BP 157/76 | HR 74 | Temp 97.0°F | Resp 20 | Ht 67.72 in | Wt 231.3 lb

## 2021-12-31 DIAGNOSIS — D469 Myelodysplastic syndrome, unspecified: Secondary | ICD-10-CM

## 2021-12-31 DIAGNOSIS — D649 Anemia, unspecified: Secondary | ICD-10-CM

## 2021-12-31 LAB — CBC WITH DIFFERENTIAL/PLATELET
Abs Immature Granulocytes: 0.03 10*3/uL (ref 0.00–0.07)
Basophils Absolute: 0 10*3/uL (ref 0.0–0.1)
Basophils Relative: 1 %
Eosinophils Absolute: 0.1 10*3/uL (ref 0.0–0.5)
Eosinophils Relative: 2 %
HCT: 23.9 % — ABNORMAL LOW (ref 39.0–52.0)
Hemoglobin: 7.9 g/dL — ABNORMAL LOW (ref 13.0–17.0)
Immature Granulocytes: 1 %
Lymphocytes Relative: 37 %
Lymphs Abs: 1.9 10*3/uL (ref 0.7–4.0)
MCH: 32.1 pg (ref 26.0–34.0)
MCHC: 33.1 g/dL (ref 30.0–36.0)
MCV: 97.2 fL (ref 80.0–100.0)
Monocytes Absolute: 0.6 10*3/uL (ref 0.1–1.0)
Monocytes Relative: 11 %
Neutro Abs: 2.5 10*3/uL (ref 1.7–7.7)
Neutrophils Relative %: 48 %
Platelets: 562 10*3/uL — ABNORMAL HIGH (ref 150–400)
RBC: 2.46 MIL/uL — ABNORMAL LOW (ref 4.22–5.81)
RDW: 29.2 % — ABNORMAL HIGH (ref 11.5–15.5)
WBC: 5.1 10*3/uL (ref 4.0–10.5)
nRBC: 0.8 % — ABNORMAL HIGH (ref 0.0–0.2)

## 2021-12-31 LAB — IRON AND TIBC
Iron: 180 ug/dL (ref 45–182)
Saturation Ratios: 87 % — ABNORMAL HIGH (ref 17.9–39.5)
TIBC: 207 ug/dL — ABNORMAL LOW (ref 250–450)
UIBC: 27 ug/dL

## 2021-12-31 LAB — FERRITIN: Ferritin: 994 ng/mL — ABNORMAL HIGH (ref 24–336)

## 2021-12-31 MED ORDER — EPOETIN ALFA-EPBX 40000 UNIT/ML IJ SOLN
40000.0000 [IU] | Freq: Once | INTRAMUSCULAR | Status: AC
  Administered 2021-12-31: 40000 [IU] via SUBCUTANEOUS
  Filled 2021-12-31: qty 1

## 2021-12-31 NOTE — Progress Notes (Signed)
Patient presents today for Retacrit injection. Hemoglobin reviewed prior to administration. VSS tolerated without incident or complaint. See MAR for details. Patient stable during and after injection. Patient discharged in satisfactory condition with no s/s of distress noted.  

## 2021-12-31 NOTE — Patient Instructions (Signed)
[facility]  Discharge Instructions: Thank you for choosing [facility] to provide your oncology and hematology care.  If you have a lab appointment with the Cancer Center, please come in thru the Main Entrance and check in at the main information desk.  Wear comfortable clothing and clothing appropriate for easy access to any Portacath or PICC line.   We strive to give you quality time with your provider. You may need to reschedule your appointment if you arrive late (15 or more minutes).  Arriving late affects you and other patients whose appointments are after yours.  Also, if you miss three or more appointments without notifying the office, you may be dismissed from the clinic at the provider's discretion.      For prescription refill requests, have your pharmacy contact our office and allow 72 hours for refills to be completed.    Today you received the following Retacrit, return as scheduled.    To help prevent nausea and vomiting after your treatment, we encourage you to take your nausea medication as directed.  BELOW ARE SYMPTOMS THAT SHOULD BE REPORTED IMMEDIATELY: *FEVER GREATER THAN 100.4 F (38 C) OR HIGHER *CHILLS OR SWEATING *NAUSEA AND VOMITING THAT IS NOT CONTROLLED WITH YOUR NAUSEA MEDICATION *UNUSUAL SHORTNESS OF BREATH *UNUSUAL BRUISING OR BLEEDING *URINARY PROBLEMS (pain or burning when urinating, or frequent urination) *BOWEL PROBLEMS (unusual diarrhea, constipation, pain near the anus) TENDERNESS IN MOUTH AND THROAT WITH OR WITHOUT PRESENCE OF ULCERS (sore throat, sores in mouth, or a toothache) UNUSUAL RASH, SWELLING OR PAIN  UNUSUAL VAGINAL DISCHARGE OR ITCHING   Items with * indicate a potential emergency and should be followed up as soon as possible or go to the Emergency Department if any problems should occur.  Please show the CHEMOTHERAPY ALERT CARD or IMMUNOTHERAPY ALERT CARD at check-in to the Emergency Department and triage  nurse.  Should you have questions after your visit or need to cancel or reschedule your appointment, please contact [facility] [phone]  and follow the prompts.  Office hours are 8:00 a.m. to 4:30 p.m. Monday - Friday. Please note that voicemails left after 4:00 p.m. may not be returned until the following business day.  We are closed weekends and major holidays. You have access to a nurse at all times for urgent questions. Please call the main number to the clinic [phone] and follow the prompts.  For any non-urgent questions, you may also contact your provider using MyChart. We now offer e-Visits for anyone 18 and older to request care online for non-urgent symptoms. For details visit [email or web address].   Also download the MyChart app! Go to the app store, search "MyChart", open the app, select , and log in with your MyChart username and password.  Due to Covid, a mask is required upon entering the hospital/clinic. If you do not have a mask, one will be given to you upon arrival. For doctor visits, patients may have 1 support person aged 18 or older with them. For treatment visits, patients cannot have anyone with them due to current Covid guidelines and our immunocompromised population.  

## 2021-12-31 NOTE — Progress Notes (Signed)
[place], [place] [address]   CLINIC:  Medical Oncology/Hematology  PCP:  Danie Chandler, [place] [address] Dr. 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, a 61 y.o. male, returns for routine follow-up for his normocytic anemia. Stephen Watts was last seen on 11/26/2021.  Today he reports feeling good. His energy has improved. He denies hematochezia and hematuria.   REVIEW OF SYSTEMS:  Review of Systems  Constitutional:  Negative for appetite change and fatigue.  Gastrointestinal:  Negative for blood in stool.  Genitourinary:  Positive for frequency. Negative for hematuria.   Psychiatric/Behavioral:  Positive for sleep disturbance.   All other systems reviewed and are negative.  PAST MEDICAL/SURGICAL HISTORY:  No past medical history on file. No past surgical history on file.  SOCIAL HISTORY:  Social History   Socioeconomic History   Marital status: Married    Spouse name: Not on file   Number of children: Not on file   Years of education: Not on file   Highest education level: Not on file  Occupational History   Not on file  Tobacco Use   Smoking status: Not on file   Smokeless tobacco: Not on file  Substance and Sexual Activity   Alcohol use: Not on file   Drug use: Not on file   Sexual activity: Not on file  Other Topics Concern   Not on file  Social History Narrative   Not on file   Social Determinants of Health   Financial Resource Strain: Not on file  Food Insecurity: Not on file  Transportation Needs: Not on file  Physical Activity: Not on file  Stress: Not on file  Social Connections: Not on file  Intimate Partner Violence: Not on file    FAMILY HISTORY:  No family history on file.  CURRENT MEDICATIONS:  Current Outpatient Medications  Medication Sig  Dispense Refill   amLODipine (NORVASC) 10 MG tablet Take 10 mg by mouth daily.     hydrochlorothiazide (HYDRODIURIL) 25 MG tablet Take 25 mg by mouth daily.     lisinopril (ZESTRIL) 40 MG tablet Take 40 mg by mouth daily.     lisinopril-hydrochlorothiazide (ZESTORETIC) 20-25 MG tablet Take 1 tablet by mouth daily.     metFORMIN (GLUCOPHAGE) 1000 MG tablet Take 1,000 mg by mouth 2 (two) times daily.     metoprolol succinate (TOPROL-XL) 50 MG 24 hr tablet Take 50 mg by mouth daily.     Olmesartan-amLODIPine-HCTZ 40-10-25 MG TABS Take 1 tablet by mouth daily.     sildenafil (VIAGRA) 50 MG tablet Take 50 mg by mouth daily as needed.     No current facility-administered medications for this visit.    ALLERGIES:  Allergies  Allergen Reactions   Ibuprofen Hives   Sulfa Antibiotics Rash    PHYSICAL EXAM:  Performance status (ECOG): 0 - Asymptomatic  Vitals:   12/31/21 1235 12/31/21 1326  BP: (!) 160/79 (!) 157/76  Pulse: 74   Resp: 20   Temp: (!) 97 F (36.1 C)   SpO2: 100%    Wt Readings from Last 3 Encounters:  12/31/21 231 lb 4.2 oz (104.9 kg)  11/26/21 229 lb 4.8 oz (104 kg)  11/12/21 230 lb (104.3 kg)   Physical Exam Vitals reviewed.  Constitutional:      Appearance: Normal  appearance. He is obese.  Cardiovascular:     Rate and Rhythm: Normal rate and regular rhythm.     Pulses: Normal pulses.     Heart sounds: Normal heart sounds.  Pulmonary:     Effort: Pulmonary effort is normal.     Breath sounds: Normal breath sounds.  Neurological:     General: No focal deficit present.     Mental Status: He is alert and oriented to person, place, and time.  Psychiatric:        Mood and Affect: Mood normal.        Behavior: Behavior normal.    LABORATORY DATA:  I have reviewed the labs as listed.     Latest Ref Rng & Units 12/31/2021   11:56 AM 12/17/2021   12:07 PM 12/10/2021   11:06 AM  CBC  WBC 4.0 - 10.5 K/uL 5.1   4.9   4.3    Hemoglobin 13.0 - 17.0 g/dL 7.9   8.2    9.8    Hematocrit 39.0 - 52.0 % 23.9   24.9   30.0    Platelets 150 - 400 K/uL 562   475   481        Latest Ref Rng & Units 09/11/2021   12:49 PM  CMP  Glucose 70 - 99 mg/dL 175    BUN 8 - 23 mg/dL 15    Creatinine 0.61 - 1.24 mg/dL 1.00    Sodium 135 - 145 mmol/L 137    Potassium 3.5 - 5.1 mmol/L 3.7    Chloride 98 - 111 mmol/L 103    CO2 22 - 32 mmol/L 25    Calcium 8.9 - 10.3 mg/dL 9.6    Total Protein 6.5 - 8.1 g/dL 8.2    Total Bilirubin 0.3 - 1.2 mg/dL 1.0    Alkaline Phos 38 - 126 U/L 53    AST 15 - 41 U/L 23    ALT 0 - 44 U/L 22        Component Value Date/Time   RBC 2.46 (L) 12/31/2021 1156   MCV 97.2 12/31/2021 1156   MCH 32.1 12/31/2021 1156   MCHC 33.1 12/31/2021 1156   RDW 29.2 (H) 12/31/2021 1156   LYMPHSABS 1.9 12/31/2021 1156   MONOABS 0.6 12/31/2021 1156   EOSABS 0.1 12/31/2021 1156   BASOSABS 0.0 12/31/2021 1156    DIAGNOSTIC IMAGING:  I have independently reviewed the scans and discussed with the patient. No results found.   ASSESSMENT:  MDS with ringed sideroblasts and thrombocytosis: - Patient seen for normocytic anemia with CBC on 05/08/2021 showing hemoglobin 8.4, MCV 97 and platelet count 631. - Ferritin was 1454, percent saturation was 85.  Hemochromatosis was negative. - He is taking iron tablet 2 times daily for the last 1 year. - Colonoscopy was reportedly done in [place] 3 years ago, within normal limits. - BMBX (10/08/2021): Hypercellular marrow with erythroid hyperplasia, erythroid dysplasia and ring sideroblasts.  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 - IPSS M: Score is -0.73 (low risk).  IPSS-R score 2.5.  Median leukemia free survival was 5.9%.  Median overall survival was 6 years.  AML transformation rate was 1.7 %/year. - Ring sideroblasts were more than 15%.  JAK2 V617F and reflex testing and BCR/ABL by  F[facility] were negative for thrombocytosis. - Serum EPO was 107. - Retacrit 30,000 units weekly started on  11/26/2021, dose changed to 40,000 units every 2 weeks on 12/31/2021.    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 - Denies any smoking history. - Paternal aunt had pancreatic cancer.  Sister had breast cancer.   PLAN:  MDS with ring sideroblasts and thrombocytosis: - He has received 4 doses of 30,000 units Retacrit weekly starting 11/26/2021. - His hemoglobin has improved from 7.8 to as high as 9.8.  He also noted improvement in energy levels since Retacrit started.  Denied any bleeding per rectum or melena. - He did not receive Retacrit last week.  Today hemoglobin is down to 7.9. - Recommend increasing Retacrit to 40,000 units once every other week to minimize trips to the clinic.  He will call us if he feels excessively tired.  If he does not have adequate response, we will switch him back to once a week injections.  RTC 8 weeks for follow-up with repeat labs.  2.  Elevated ferritin (hemochromatosis negative): - Ferritin today has improved to 994 with percent saturation 97.  Previously 1070 and 85 respectively.  Hemochromatosis panel was negative.  3.  Hypertension: - Blood pressure today is 160/79.  Repeat blood pressure is 155/76. - Continue lisinopril 40 mg daily and HCTZ 25 mg daily.  He is also on Norvasc 10 mg daily.  We will closely monitor and titrate up the medications.  Orders placed this encounter:  No orders of the defined types were placed in this encounter.    Derek Jack, MD [facility] [phone]   I, Stephen Watts, am acting as a scribe for Dr. Derek Jack.  I, Derek Jack MD, have reviewed the above documentation for accuracy and completeness, and I agree with the above.

## 2021-12-31 NOTE — Patient Instructions (Signed)
[place] at [facility] Discharge Instructions   You were seen and examined today by Dr. Delton Coombes.  He reviewed your lab work today. Your hemoglobin dropped to 7.9. We will give the Retacrit injection today.   Return as scheduled for lab work, injections, and office visit.    Thank you for choosing [place] at [facility] to provide your oncology and hematology care.  To afford each patient quality time with our provider, please arrive at least 15 minutes before your scheduled appointment time.   If you have a lab appointment with the [facility] please come in thru the Main Entrance and check in at the main information desk.  You need to re-schedule your appointment should you arrive 10 or more minutes late.  We strive to give you quality time with our providers, and arriving late affects you and other patients whose appointments are after yours.  Also, if you no show three or more times for appointments you may be dismissed from the clinic at the providers discretion.     Again, thank you for choosing [facility].  Our hope is that these requests will decrease the amount of time that you wait before being seen by our physicians.       _____________________________________________________________  Should you have questions after your visit to [facility], please contact our office at [phone] and follow the prompts.  Our office hours are 8:00 a.m. and 4:30 p.m. Monday - Friday.  Please note that voicemails left after 4:00 p.m. may not be returned until the following business day.  We are closed weekends and major holidays.  You do have access to a nurse 24-7, just call the main number to the clinic [phone] and do not press any options, hold on the line and a nurse will answer the phone.    For prescription refill requests, have your pharmacy contact our office and allow 72 hours.    Due to Covid, you  will need to wear a mask upon entering the hospital. If you do not have a mask, a mask will be given to you at the Main Entrance upon arrival. For doctor visits, patients may have 1 support person age 1 or older with them. For treatment visits, patients can not have anyone with them due to social distancing guidelines and our immunocompromised population.

## 2022-01-14 ENCOUNTER — Encounter (HOSPITAL_COMMUNITY): Payer: Self-pay | Admitting: Hematology

## 2022-01-18 ENCOUNTER — Inpatient Hospital Stay (HOSPITAL_COMMUNITY): Payer: 59

## 2022-01-18 ENCOUNTER — Inpatient Hospital Stay (HOSPITAL_COMMUNITY): Payer: 59 | Attending: Hematology

## 2022-01-18 VITALS — BP 156/74 | HR 77 | Temp 99.1°F | Resp 18

## 2022-01-18 DIAGNOSIS — Z79899 Other long term (current) drug therapy: Secondary | ICD-10-CM | POA: Insufficient documentation

## 2022-01-18 DIAGNOSIS — D75839 Thrombocytosis, unspecified: Secondary | ICD-10-CM | POA: Diagnosis present

## 2022-01-18 DIAGNOSIS — D469 Myelodysplastic syndrome, unspecified: Secondary | ICD-10-CM

## 2022-01-18 DIAGNOSIS — D649 Anemia, unspecified: Secondary | ICD-10-CM | POA: Diagnosis not present

## 2022-01-18 DIAGNOSIS — I10 Essential (primary) hypertension: Secondary | ICD-10-CM | POA: Diagnosis not present

## 2022-01-18 LAB — CBC
HCT: 22.9 % — ABNORMAL LOW (ref 39.0–52.0)
Hemoglobin: 7.7 g/dL — ABNORMAL LOW (ref 13.0–17.0)
MCH: 32.4 pg (ref 26.0–34.0)
MCHC: 33.6 g/dL (ref 30.0–36.0)
MCV: 96.2 fL (ref 80.0–100.0)
Platelets: 564 10*3/uL — ABNORMAL HIGH (ref 150–400)
RBC: 2.38 MIL/uL — ABNORMAL LOW (ref 4.22–5.81)
RDW: 29.5 % — ABNORMAL HIGH (ref 11.5–15.5)
WBC: 8.1 10*3/uL (ref 4.0–10.5)
nRBC: 1.1 % — ABNORMAL HIGH (ref 0.0–0.2)

## 2022-01-18 MED ORDER — EPOETIN ALFA-EPBX 40000 UNIT/ML IJ SOLN
40000.0000 [IU] | Freq: Once | INTRAMUSCULAR | Status: AC
  Administered 2022-01-18: 40000 [IU] via SUBCUTANEOUS
  Filled 2022-01-18: qty 1

## 2022-01-18 NOTE — Progress Notes (Signed)
Stephen Watts presents today for injection per the provider's orders.  Retacrit administration without incident; injection site WNL; see MAR for injection details.  Patient tolerated procedure well and without incident.  No questions or complaints noted at this time. Pt's hemoglobin noted to be 7.7 today. Pt advised to call the clinic if he becomes extremely fatigued. Pt verbalized understanding.  Discharged from clinic ambulatory in stable condition. Alert and oriented x 3. F/U with [facility] as scheduled.

## 2022-01-18 NOTE — Patient Instructions (Signed)
[facility]  Discharge Instructions: Thank you for choosing [facility] to provide your oncology and hematology care.  If you have a lab appointment with the [facility], please come in thru the Main Entrance and check in at the main information desk.  Wear comfortable clothing and clothing appropriate for easy access to any Portacath or PICC line.   We strive to give you quality time with your provider. You may need to reschedule your appointment if you arrive late (15 or more minutes).  Arriving late affects you and other patients whose appointments are after yours.  Also, if you miss three or more appointments without notifying the office, you may be dismissed from the clinic at the provider's discretion.      For prescription refill requests, have your pharmacy contact our office and allow 72 hours for refills to be completed.    Today you received Epoetin     BELOW ARE SYMPTOMS THAT SHOULD BE REPORTED IMMEDIATELY: *FEVER GREATER THAN 100.4 F (38 C) OR HIGHER *CHILLS OR SWEATING *NAUSEA AND VOMITING THAT IS NOT CONTROLLED WITH YOUR NAUSEA MEDICATION *UNUSUAL SHORTNESS OF BREATH *UNUSUAL BRUISING OR BLEEDING *URINARY PROBLEMS (pain or burning when urinating, or frequent urination) *BOWEL PROBLEMS (unusual diarrhea, constipation, pain near the anus) TENDERNESS IN MOUTH AND THROAT WITH OR WITHOUT PRESENCE OF ULCERS (sore throat, sores in mouth, or a toothache) UNUSUAL RASH, SWELLING OR PAIN  UNUSUAL VAGINAL DISCHARGE OR ITCHING   Items with * indicate a potential emergency and should be followed up as soon as possible or go to the Emergency Department if any problems should occur.  Please show the CHEMOTHERAPY ALERT CARD or IMMUNOTHERAPY ALERT CARD at check-in to the Emergency Department and triage nurse.  Should you have questions after your visit or need to cancel or reschedule your appointment, please contact [facility] [phone]  and  follow the prompts.  Office hours are 8:00 a.m. to 4:30 p.m. Monday - Friday. Please note that voicemails left after 4:00 p.m. may not be returned until the following business day.  We are closed weekends and major holidays. You have access to a nurse at all times for urgent questions. Please call the main number to the clinic [phone] and follow the prompts.  For any non-urgent questions, you may also contact your provider using MyChart. We now offer e-Visits for anyone 24 and older to request care online for non-urgent symptoms. For details visit [email or web address].   Also download the MyChart app! Go to the app store, search "MyChart", open the app, select [facility], and log in with your MyChart username and password.  Due to Covid, a mask is required upon entering the hospital/clinic. If you do not have a mask, one will be given to you upon arrival. For doctor visits, patients may have 1 support person aged 70 or older with them. For treatment visits, patients cannot have anyone with them due to current Covid guidelines and our immunocompromised population.

## 2022-02-01 ENCOUNTER — Inpatient Hospital Stay (HOSPITAL_COMMUNITY): Payer: 59

## 2022-02-01 VITALS — BP 147/77 | HR 79 | Temp 98.0°F | Resp 18 | Ht 67.0 in | Wt 227.3 lb

## 2022-02-01 DIAGNOSIS — D649 Anemia, unspecified: Secondary | ICD-10-CM

## 2022-02-01 DIAGNOSIS — D469 Myelodysplastic syndrome, unspecified: Secondary | ICD-10-CM

## 2022-02-01 LAB — CBC
HCT: 22.5 % — ABNORMAL LOW (ref 39.0–52.0)
Hemoglobin: 7.6 g/dL — ABNORMAL LOW (ref 13.0–17.0)
MCH: 32.6 pg (ref 26.0–34.0)
MCHC: 33.8 g/dL (ref 30.0–36.0)
MCV: 96.6 fL (ref 80.0–100.0)
Platelets: 490 10*3/uL — ABNORMAL HIGH (ref 150–400)
RBC: 2.33 MIL/uL — ABNORMAL LOW (ref 4.22–5.81)
RDW: 30.5 % — ABNORMAL HIGH (ref 11.5–15.5)
WBC: 4.6 10*3/uL (ref 4.0–10.5)
nRBC: 0.9 % — ABNORMAL HIGH (ref 0.0–0.2)

## 2022-02-01 MED ORDER — EPOETIN ALFA-EPBX 40000 UNIT/ML IJ SOLN
40000.0000 [IU] | Freq: Once | INTRAMUSCULAR | Status: AC
  Administered 2022-02-01: 40000 [IU] via SUBCUTANEOUS
  Filled 2022-02-01: qty 1

## 2022-02-15 ENCOUNTER — Inpatient Hospital Stay (HOSPITAL_COMMUNITY): Payer: 59

## 2022-02-15 ENCOUNTER — Inpatient Hospital Stay (HOSPITAL_COMMUNITY): Payer: 59 | Attending: Hematology

## 2022-02-15 VITALS — BP 164/80 | HR 76 | Temp 96.6°F | Resp 20 | Wt 229.2 lb

## 2022-02-15 DIAGNOSIS — Z800 Family history of malignant neoplasm of digestive organs: Secondary | ICD-10-CM | POA: Diagnosis not present

## 2022-02-15 DIAGNOSIS — D75839 Thrombocytosis, unspecified: Secondary | ICD-10-CM | POA: Diagnosis present

## 2022-02-15 DIAGNOSIS — I10 Essential (primary) hypertension: Secondary | ICD-10-CM | POA: Insufficient documentation

## 2022-02-15 DIAGNOSIS — D469 Myelodysplastic syndrome, unspecified: Secondary | ICD-10-CM

## 2022-02-15 DIAGNOSIS — R5383 Other fatigue: Secondary | ICD-10-CM | POA: Diagnosis not present

## 2022-02-15 DIAGNOSIS — Z882 Allergy status to sulfonamides status: Secondary | ICD-10-CM | POA: Insufficient documentation

## 2022-02-15 DIAGNOSIS — D649 Anemia, unspecified: Secondary | ICD-10-CM

## 2022-02-15 DIAGNOSIS — G479 Sleep disorder, unspecified: Secondary | ICD-10-CM | POA: Insufficient documentation

## 2022-02-15 DIAGNOSIS — E669 Obesity, unspecified: Secondary | ICD-10-CM | POA: Diagnosis not present

## 2022-02-15 DIAGNOSIS — Z803 Family history of malignant neoplasm of breast: Secondary | ICD-10-CM | POA: Diagnosis not present

## 2022-02-15 DIAGNOSIS — Z79899 Other long term (current) drug therapy: Secondary | ICD-10-CM | POA: Diagnosis not present

## 2022-02-15 LAB — CBC
HCT: 22.6 % — ABNORMAL LOW (ref 39.0–52.0)
Hemoglobin: 7.3 g/dL — ABNORMAL LOW (ref 13.0–17.0)
MCH: 31.2 pg (ref 26.0–34.0)
MCHC: 32.3 g/dL (ref 30.0–36.0)
MCV: 96.6 fL (ref 80.0–100.0)
Platelets: 509 10*3/uL — ABNORMAL HIGH (ref 150–400)
RBC: 2.34 MIL/uL — ABNORMAL LOW (ref 4.22–5.81)
RDW: 31.4 % — ABNORMAL HIGH (ref 11.5–15.5)
WBC: 5.1 10*3/uL (ref 4.0–10.5)
nRBC: 1.6 % — ABNORMAL HIGH (ref 0.0–0.2)

## 2022-02-15 MED ORDER — EPOETIN ALFA-EPBX 40000 UNIT/ML IJ SOLN
40000.0000 [IU] | Freq: Once | INTRAMUSCULAR | Status: AC
  Administered 2022-02-15: 40000 [IU] via SUBCUTANEOUS
  Filled 2022-02-15: qty 1

## 2022-02-15 NOTE — Patient Instructions (Signed)
[facility]  Discharge Instructions: Thank you for choosing [facility] to provide your oncology and hematology care.  If you have a lab appointment with the Cancer Center, please come in thru the Main Entrance and check in at the main information desk.  Wear comfortable clothing and clothing appropriate for easy access to any Portacath or PICC line.   We strive to give you quality time with your provider. You may need to reschedule your appointment if you arrive late (15 or more minutes).  Arriving late affects you and other patients whose appointments are after yours.  Also, if you miss three or more appointments without notifying the office, you may be dismissed from the clinic at the provider's discretion.      For prescription refill requests, have your pharmacy contact our office and allow 72 hours for refills to be completed.    Today you received the following chemotherapy and/or immunotherapy agents Retacrit      To help prevent nausea and vomiting after your treatment, we encourage you to take your nausea medication as directed.  BELOW ARE SYMPTOMS THAT SHOULD BE REPORTED IMMEDIATELY: *FEVER GREATER THAN 100.4 F (38 C) OR HIGHER *CHILLS OR SWEATING *NAUSEA AND VOMITING THAT IS NOT CONTROLLED WITH YOUR NAUSEA MEDICATION *UNUSUAL SHORTNESS OF BREATH *UNUSUAL BRUISING OR BLEEDING *URINARY PROBLEMS (pain or burning when urinating, or frequent urination) *BOWEL PROBLEMS (unusual diarrhea, constipation, pain near the anus) TENDERNESS IN MOUTH AND THROAT WITH OR WITHOUT PRESENCE OF ULCERS (sore throat, sores in mouth, or a toothache) UNUSUAL RASH, SWELLING OR PAIN  UNUSUAL VAGINAL DISCHARGE OR ITCHING   Items with * indicate a potential emergency and should be followed up as soon as possible or go to the Emergency Department if any problems should occur.  Please show the CHEMOTHERAPY ALERT CARD or IMMUNOTHERAPY ALERT CARD at check-in to the Emergency  Department and triage nurse.  Should you have questions after your visit or need to cancel or reschedule your appointment, please contact [facility] [phone]  and follow the prompts.  Office hours are 8:00 a.m. to 4:30 p.m. Monday - Friday. Please note that voicemails left after 4:00 p.m. may not be returned until the following business day.  We are closed weekends and major holidays. You have access to a nurse at all times for urgent questions. Please call the main number to the clinic [phone] and follow the prompts.  For any non-urgent questions, you may also contact your provider using MyChart. We now offer e-Visits for anyone 18 and older to request care online for non-urgent symptoms. For details visit [email or web address].   Also download the MyChart app! Go to the app store, search "MyChart", open the app, select [facility], and log in with your MyChart username and password.  Masks are optional in the cancer centers. If you would like for your care team to wear a mask while they are taking care of you, please let them know. For doctor visits, patients may have with them one support person who is at least 63 years old. At this time, visitors are not allowed in the infusion area.  

## 2022-02-15 NOTE — Progress Notes (Signed)
Stephen Watts presents today for Retacrit injection per the provider's orders.  Stable during administration without incident; injection site WNL; see MAR for injection details.  Patient tolerated procedure well and without incident.  No questions or complaints noted at this time.  Discharge from clinic ambulatory in stable condition.  Alert and oriented X 3.  Follow up with [facility] as scheduled.

## 2022-02-22 ENCOUNTER — Encounter (HOSPITAL_COMMUNITY): Payer: Self-pay

## 2022-02-22 ENCOUNTER — Inpatient Hospital Stay (HOSPITAL_COMMUNITY): Payer: 59

## 2022-02-22 VITALS — BP 165/79 | HR 68 | Temp 97.3°F | Resp 18 | Wt 231.6 lb

## 2022-02-22 DIAGNOSIS — D469 Myelodysplastic syndrome, unspecified: Secondary | ICD-10-CM

## 2022-02-22 DIAGNOSIS — D649 Anemia, unspecified: Secondary | ICD-10-CM

## 2022-02-22 LAB — CBC
HCT: 22.7 % — ABNORMAL LOW (ref 39.0–52.0)
Hemoglobin: 7.5 g/dL — ABNORMAL LOW (ref 13.0–17.0)
MCH: 32.1 pg (ref 26.0–34.0)
MCHC: 33 g/dL (ref 30.0–36.0)
MCV: 97 fL (ref 80.0–100.0)
Platelets: 553 10*3/uL — ABNORMAL HIGH (ref 150–400)
RBC: 2.34 MIL/uL — ABNORMAL LOW (ref 4.22–5.81)
RDW: 31.6 % — ABNORMAL HIGH (ref 11.5–15.5)
WBC: 4.6 10*3/uL (ref 4.0–10.5)
nRBC: 0.9 % — ABNORMAL HIGH (ref 0.0–0.2)

## 2022-02-22 MED ORDER — EPOETIN ALFA-EPBX 40000 UNIT/ML IJ SOLN
40000.0000 [IU] | Freq: Once | INTRAMUSCULAR | Status: AC
  Administered 2022-02-22: 40000 [IU] via SUBCUTANEOUS
  Filled 2022-02-22: qty 1

## 2022-02-22 NOTE — Progress Notes (Signed)
Patient presents today for Retacrit injection. Hemoglobin reviewed prior to administration. VSS. Injection tolerated without incident or complaint. See MAR for details. Patient stable during and after injection.  Patient discharged in satisfactory condition with no s/s of distress noted.    

## 2022-02-22 NOTE — Patient Instructions (Signed)
[facility]  Discharge Instructions: Thank you for choosing [facility] to provide your oncology and hematology care.  If you have a lab appointment with the [facility], please come in thru the Main Entrance and check in at the main information desk.  Wear comfortable clothing and clothing appropriate for easy access to any Portacath or PICC line.   We strive to give you quality time with your provider. You may need to reschedule your appointment if you arrive late (15 or more minutes).  Arriving late affects you and other patients whose appointments are after yours.  Also, if you miss three or more appointments without notifying the office, you may be dismissed from the clinic at the provider's discretion.      For prescription refill requests, have your pharmacy contact our office and allow 72 hours for refills to be completed.    Today you received the following chemotherapy and/or immunotherapy agents retacrit Epoetin Alfa injection What is this medication? EPOETIN ALFA (e POE e tin AL fa) helps your body make more red blood cells. This medicine is used to treat anemia caused by chronic kidney disease, cancer chemotherapy, or HIV-therapy. It may also be used before surgery if you have anemia. This medicine may be used for other purposes; ask your health care provider or pharmacist if you have questions. COMMON BRAND NAME(S): Epogen, Procrit, Retacrit What should I tell my care team before I take this medication? They need to know if you have any of these conditions: cancer heart disease high blood pressure history of blood clots history of stroke low levels of folate, iron, or vitamin B12 in the blood seizures an unusual or allergic reaction to erythropoietin, albumin, benzyl alcohol, hamster proteins, other medicines, foods, dyes, or preservatives pregnant or trying to get pregnant breast-feeding How should I use this medication? This medicine is for  injection into a vein or under the skin. It is usually given by a health care professional in a hospital or clinic setting. If you get this medicine at home, you will be taught how to prepare and give this medicine. Use exactly as directed. Take your medicine at regular intervals. Do not take your medicine more often than directed. It is important that you put your used needles and syringes in a special sharps container. Do not put them in a trash can. If you do not have a sharps container, call your pharmacist or healthcare provider to get one. A special MedGuide will be given to you by the pharmacist with each prescription and refill. Be sure to read this information carefully each time. Talk to your pediatrician regarding the use of this medicine in children. While this drug may be prescribed for selected conditions, precautions do apply. Overdosage: If you think you have taken too much of this medicine contact a poison control center or emergency room at once. NOTE: This medicine is only for you. Do not share this medicine with others. What if I miss a dose? If you miss a dose, take it as soon as you can. If it is almost time for your next dose, take only that dose. Do not take double or extra doses. What may interact with this medication? Interactions have not been studied. This list may not describe all possible interactions. Give your health care provider a list of all the medicines, herbs, non-prescription drugs, or dietary supplements you use. Also tell them if you smoke, drink alcohol, or use illegal drugs. Some items  may interact with your medicine. What should I watch for while using this medication? Your condition will be monitored carefully while you are receiving this medicine. You may need blood work done while you are taking this medicine. This medicine may cause a decrease in vitamin B6. You should make sure that you get enough vitamin B6 while you are taking this medicine. Discuss  the foods you eat and the vitamins you take with your health care professional. What side effects may I notice from receiving this medication? Side effects that you should report to your doctor or health care professional as soon as possible: allergic reactions like skin rash, itching or hives, swelling of the face, lips, or tongue seizures signs and symptoms of a blood clot such as breathing problems; changes in vision; chest pain; severe, sudden headache; pain, swelling, warmth in the leg; trouble speaking; sudden numbness or weakness of the face, arm or leg signs and symptoms of a stroke like changes in vision; confusion; trouble speaking or understanding; severe headaches; sudden numbness or weakness of the face, arm or leg; trouble walking; dizziness; loss of balance or coordination Side effects that usually do not require medical attention (report to your doctor or health care professional if they continue or are bothersome): chills cough dizziness fever headaches joint pain muscle cramps muscle pain nausea, vomiting pain, redness, or irritation at site where injected This list may not describe all possible side effects. Call your doctor for medical advice about side effects. You may report side effects to FDA at 1-800-FDA-1088. Where should I keep my medication? Keep out of the reach of children. Store in a refrigerator between 2 and 8 degrees C (36 and 46 degrees F). Do not freeze or shake. Throw away any unused portion if using a single-dose vial. Multi-dose vials can be kept in the refrigerator for up to 21 days after the initial dose. Throw away unused medicine. NOTE: This sheet is a summary. It may not cover all possible information. If you have questions about this medicine, talk to your doctor, pharmacist, or health care provider.  2023 Elsevier/Gold Standard (2017-03-29 00:00:00)       To help prevent nausea and vomiting after your treatment, we encourage you to take your  nausea medication as directed.  BELOW ARE SYMPTOMS THAT SHOULD BE REPORTED IMMEDIATELY: *FEVER GREATER THAN 100.4 F (38 C) OR HIGHER *CHILLS OR SWEATING *NAUSEA AND VOMITING THAT IS NOT CONTROLLED WITH YOUR NAUSEA MEDICATION *UNUSUAL SHORTNESS OF BREATH *UNUSUAL BRUISING OR BLEEDING *URINARY PROBLEMS (pain or burning when urinating, or frequent urination) *BOWEL PROBLEMS (unusual diarrhea, constipation, pain near the anus) TENDERNESS IN MOUTH AND THROAT WITH OR WITHOUT PRESENCE OF ULCERS (sore throat, sores in mouth, or a toothache) UNUSUAL RASH, SWELLING OR PAIN  UNUSUAL VAGINAL DISCHARGE OR ITCHING   Items with * indicate a potential emergency and should be followed up as soon as possible or go to the Emergency Department if any problems should occur.  Please show the CHEMOTHERAPY ALERT CARD or IMMUNOTHERAPY ALERT CARD at check-in to the Emergency Department and triage nurse.  Should you have questions after your visit or need to cancel or reschedule your appointment, please contact [facility] [phone]  and follow the prompts.  Office hours are 8:00 a.m. to 4:30 p.m. Monday - Friday. Please note that voicemails left after 4:00 p.m. may not be returned until the following business day.  We are closed weekends and major holidays. You have access to a nurse at  all times for urgent questions. Please call the main number to the clinic [phone] and follow the prompts.  For any non-urgent questions, you may also contact your provider using MyChart. We now offer e-Visits for anyone 95 and older to request care online for non-urgent symptoms. For details visit [email or web address].   Also download the MyChart app! Go to the app store, search "MyChart", open the app, select [facility], and log in with your MyChart username and password.  Masks are optional in the cancer centers. If you would like for your care team to wear a mask while they are taking care of you, please let  them know. For doctor visits, patients may have with them one support person who is at least 63 years old. At this time, visitors are not allowed in the infusion area.

## 2022-03-01 ENCOUNTER — Inpatient Hospital Stay (HOSPITAL_BASED_OUTPATIENT_CLINIC_OR_DEPARTMENT_OTHER): Payer: 59 | Admitting: Hematology

## 2022-03-01 ENCOUNTER — Inpatient Hospital Stay (HOSPITAL_COMMUNITY): Payer: 59

## 2022-03-01 VITALS — BP 153/70 | HR 76 | Temp 98.3°F | Resp 18 | Wt 229.8 lb

## 2022-03-01 DIAGNOSIS — D469 Myelodysplastic syndrome, unspecified: Secondary | ICD-10-CM | POA: Diagnosis not present

## 2022-03-01 DIAGNOSIS — D649 Anemia, unspecified: Secondary | ICD-10-CM

## 2022-03-01 LAB — CBC WITH DIFFERENTIAL/PLATELET
Abs Immature Granulocytes: 0.02 10*3/uL (ref 0.00–0.07)
Basophils Absolute: 0.1 10*3/uL (ref 0.0–0.1)
Basophils Relative: 1 %
Eosinophils Absolute: 0.1 10*3/uL (ref 0.0–0.5)
Eosinophils Relative: 3 %
HCT: 23.7 % — ABNORMAL LOW (ref 39.0–52.0)
Hemoglobin: 7.5 g/dL — ABNORMAL LOW (ref 13.0–17.0)
Immature Granulocytes: 0 %
Lymphocytes Relative: 38 %
Lymphs Abs: 1.9 10*3/uL (ref 0.7–4.0)
MCH: 30.9 pg (ref 26.0–34.0)
MCHC: 31.6 g/dL (ref 30.0–36.0)
MCV: 97.5 fL (ref 80.0–100.0)
Monocytes Absolute: 0.6 10*3/uL (ref 0.1–1.0)
Monocytes Relative: 12 %
Neutro Abs: 2.3 10*3/uL (ref 1.7–7.7)
Neutrophils Relative %: 46 %
Platelets: 530 10*3/uL — ABNORMAL HIGH (ref 150–400)
RBC: 2.43 MIL/uL — ABNORMAL LOW (ref 4.22–5.81)
RDW: 31.9 % — ABNORMAL HIGH (ref 11.5–15.5)
WBC: 4.9 10*3/uL (ref 4.0–10.5)
nRBC: 0.8 % — ABNORMAL HIGH (ref 0.0–0.2)

## 2022-03-01 LAB — IRON AND TIBC
Iron: 178 ug/dL (ref 45–182)
Saturation Ratios: 88 % — ABNORMAL HIGH (ref 17.9–39.5)
TIBC: 202 ug/dL — ABNORMAL LOW (ref 250–450)
UIBC: 24 ug/dL

## 2022-03-01 LAB — FERRITIN: Ferritin: 856 ng/mL — ABNORMAL HIGH (ref 24–336)

## 2022-03-01 MED ORDER — EPOETIN ALFA-EPBX 40000 UNIT/ML IJ SOLN
40000.0000 [IU] | Freq: Once | INTRAMUSCULAR | Status: AC
  Administered 2022-03-01: 40000 [IU] via SUBCUTANEOUS
  Filled 2022-03-01: qty 1

## 2022-03-01 MED ORDER — EPOETIN ALFA-EPBX 20000 UNIT/ML IJ SOLN
20000.0000 [IU] | Freq: Once | INTRAMUSCULAR | Status: AC
  Administered 2022-03-01: 20000 [IU] via SUBCUTANEOUS
  Filled 2022-03-01: qty 1

## 2022-03-01 NOTE — Patient Instructions (Addendum)
[facility] at [facility] Discharge Instructions   You were seen and examined today by Dr. Delton Coombes.  He reviewed the results of your lab work. Your hemoglobin is not improving on the every other week shot. We will increase the dose of the injection you're getting and change your injections to weekly.   We will see you back in 6 weeks.    Thank you for choosing [facility] at [facility] to provide your oncology and hematology care.  To afford each patient quality time with our provider, please arrive at least 15 minutes before your scheduled appointment time.   If you have a lab appointment with the [facility] please come in thru the Main Entrance and check in at the main information desk.  You need to re-schedule your appointment should you arrive 10 or more minutes late.  We strive to give you quality time with our providers, and arriving late affects you and other patients whose appointments are after yours.  Also, if you no show three or more times for appointments you may be dismissed from the clinic at the providers discretion.     Again, thank you for choosing [facility].  Our hope is that these requests will decrease the amount of time that you wait before being seen by our physicians.       _____________________________________________________________  Should you have questions after your visit to [facility], please contact our office at [phone] and follow the prompts.  Our office hours are 8:00 a.m. and 4:30 p.m. Monday - Friday.  Please note that voicemails left after 4:00 p.m. may not be returned until the following business day.  We are closed weekends and major holidays.  You do have access to a nurse 24-7, just call the main number to the clinic [phone] and do not press any options, hold on the line and a nurse will answer the phone.    For prescription refill requests, have your pharmacy  contact our office and allow 72 hours.    Due to Covid, you will need to wear a mask upon entering the hospital. If you do not have a mask, a mask will be given to you at the Main Entrance upon arrival. For doctor visits, patients may have 1 support person age 69 or older with them. For treatment visits, patients can not have anyone with them due to social distancing guidelines and our immunocompromised population.

## 2022-03-01 NOTE — Progress Notes (Signed)
[place], [place] [address]   CLINIC:  Medical Oncology/Hematology  PCP:  Danie Chandler, [place] [address] Dr. 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, a 61 y.o. 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 Stephen Watts was last seen on 12/31/2021.  Today he reports feeling well. He reports fatigue starting on 7/20. He denies hematochezia and hematuria. He denies light-headedness and CP.   REVIEW OF SYSTEMS:  Review of Systems  Constitutional:  Positive for fatigue. Negative for appetite change.  Cardiovascular:  Negative for chest pain.  Gastrointestinal:  Negative for blood in stool.  Genitourinary:  Positive for frequency. Negative for hematuria.   Neurological:  Negative for light-headedness.  Psychiatric/Behavioral:  Positive for sleep disturbance.   All other systems reviewed and are negative.   PAST MEDICAL/SURGICAL HISTORY:  No past medical history on file. No past surgical history on file.  SOCIAL HISTORY:  Social History   Socioeconomic History   Marital status: Married    Spouse name: Not on file   Number of children: Not on file   Years of education: Not on file   Highest education level: Not on file  Occupational History   Not on file  Tobacco Use   Smoking status: Not on file   Smokeless tobacco: Not on file  Substance and Sexual Activity   Alcohol use: Not on file   Drug use: Not on file   Sexual activity: Not on file  Other Topics Concern   Not on file  Social History Narrative   Not on file   Social Determinants of Health   Financial Resource Strain: Not on file  Food Insecurity: Not on file  Transportation Needs: Not on file  Physical Activity: Not on file  Stress: Not on file  Social Connections: Not on file  Intimate  Partner Violence: Not on file    FAMILY HISTORY:  No family history on file.  CURRENT MEDICATIONS:  Current Outpatient Medications  Medication Sig Dispense Refill   amLODipine (NORVASC) 10 MG tablet Take 10 mg by mouth daily.     hydrochlorothiazide (HYDRODIURIL) 25 MG tablet Take 25 mg by mouth daily.     lisinopril (ZESTRIL) 40 MG tablet Take 40 mg by mouth daily.     lisinopril-hydrochlorothiazide (ZESTORETIC) 20-25 MG tablet Take 1 tablet by mouth daily.     metFORMIN (GLUCOPHAGE) 1000 MG tablet Take 1,000 mg by mouth 2 (two) times daily.     metoprolol succinate (TOPROL-XL) 50 MG 24 hr tablet Take 50 mg by mouth daily.     Olmesartan-amLODIPine-HCTZ 40-10-25 MG TABS Take 1 tablet by mouth daily.     sildenafil (VIAGRA) 50 MG tablet Take 50 mg by mouth daily as needed.     No current facility-administered medications for this visit.    ALLERGIES:  Allergies  Allergen Reactions   Ibuprofen Hives   Sulfa Antibiotics Rash    PHYSICAL EXAM:  Performance status (ECOG): 0 - Asymptomatic  There were no vitals filed for this visit. Wt Readings from Last 3 Encounters:  02/22/22 231 lb 9.6 oz (105.1 kg)  02/15/22 229 lb 3.2 oz (104 kg)  02/01/22 227 lb 4.8 oz (103.1 kg)   Physical Exam Vitals reviewed.  Constitutional:      Appearance: Normal appearance.  He is obese.  Cardiovascular:     Rate and Rhythm: Normal rate and regular rhythm.     Pulses: Normal pulses.     Heart sounds: Normal heart sounds.  Pulmonary:     Effort: Pulmonary effort is normal.     Breath sounds: Normal breath sounds.  Neurological:     General: No focal deficit present.     Mental Status: He is alert and oriented to person, place, and time.  Psychiatric:        Mood and Affect: Mood normal.        Behavior: Behavior normal.     LABORATORY DATA:  I have reviewed the labs as listed.     Latest Ref Rng & Units 02/22/2022    9:41 AM 02/15/2022    9:45 AM 02/01/2022    9:28 AM  CBC  WBC 4.0  - 10.5 K/uL 4.6  5.1  4.6   Hemoglobin 13.0 - 17.0 g/dL 7.5  7.3  7.6   Hematocrit 39.0 - 52.0 % 22.7  22.6  22.5   Platelets 150 - 400 K/uL 553  509  490       Latest Ref Rng & Units 09/11/2021   12:49 PM  CMP  Glucose 70 - 99 mg/dL 175   BUN 8 - 23 mg/dL 15   Creatinine 0.61 - 1.24 mg/dL 1.00   Sodium 135 - 145 mmol/L 137   Potassium 3.5 - 5.1 mmol/L 3.7   Chloride 98 - 111 mmol/L 103   CO2 22 - 32 mmol/L 25   Calcium 8.9 - 10.3 mg/dL 9.6   Total Protein 6.5 - 8.1 g/dL 8.2   Total Bilirubin 0.3 - 1.2 mg/dL 1.0   Alkaline Phos 38 - 126 U/L 53   AST 15 - 41 U/L 23   ALT 0 - 44 U/L 22       Component Value Date/Time   RBC 2.34 (L) 02/22/2022 0941   MCV 97.0 02/22/2022 0941   MCH 32.1 02/22/2022 0941   MCHC 33.0 02/22/2022 0941   RDW 31.6 (H) 02/22/2022 0941   LYMPHSABS 1.9 12/31/2021 1156   MONOABS 0.6 12/31/2021 1156   EOSABS 0.1 12/31/2021 1156   BASOSABS 0.0 12/31/2021 1156    DIAGNOSTIC IMAGING:  I have independently reviewed the scans and discussed with the patient. No results found.   ASSESSMENT:  MDS with ringed sideroblasts and thrombocytosis: - Patient seen for normocytic anemia with CBC on 05/08/2021 showing hemoglobin 8.4, MCV 97 and platelet count 631. - Ferritin was 1454, percent saturation was 85.  Hemochromatosis was negative. - He is taking iron tablet 2 times daily for the last 1 year. - Colonoscopy was reportedly done in [place] 3 years ago, within normal limits. - BMBX (10/08/2021): Hypercellular marrow with erythroid hyperplasia, erythroid dysplasia and ring sideroblasts.  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 - IPSS M: Score is -0.73 (low risk).  IPSS-R score 2.5.  Median leukemia free survival was 5.9%.  Median overall survival was 6 years.  AML transformation rate was 1.7 %/year. - Ring sideroblasts were more than 15%.   JAK2 V617F and reflex testing and BCR/ABL by FISH were negative for thrombocytosis. - Serum EPO was 107. - Retacrit 30,000 units weekly started on 11/26/2021, dose changed to 40,000 units every 2 weeks on 12/31/2021.    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 - Denies any smoking history. - Paternal aunt had pancreatic cancer.  Sister had breast cancer.     PLAN:  MDS with ring sideroblasts and thrombocytosis: - We have increased the Retacrit to 40,000 units on 12/31/2021. - His hemoglobin is ranging between 7.5-7.9. - He reports feeling weak for the last 2 to 3 days.  No bleeding per rectum or melena. - Recommend increasing Retacrit to 60,000 units weekly. - We will reevaluate him in 5 weeks.  If there is no improvement, will discontinue Retacrit and consider Luspatercept.  2.  Elevated ferritin (hemochromatosis negative): - Ferritin is 856, down from 994. - Percent saturation is 88.  3.  Hypertension: - Continue lisinopril and HCTZ and Norvasc.  Blood pressure today is 153/70.  Orders placed this encounter:  No orders of the defined types were placed in this encounter.    Derek Jack, MD [place] [phone]   I, Thana Ates, am acting as a scribe for Dr. Derek Jack.  I, Derek Jack MD, have reviewed the above documentation for accuracy and completeness, and I agree with the above.

## 2022-03-08 ENCOUNTER — Inpatient Hospital Stay (HOSPITAL_COMMUNITY): Payer: 59

## 2022-03-08 VITALS — BP 145/71 | HR 74 | Temp 97.2°F | Resp 18 | Wt 229.8 lb

## 2022-03-08 DIAGNOSIS — D469 Myelodysplastic syndrome, unspecified: Secondary | ICD-10-CM | POA: Diagnosis not present

## 2022-03-08 DIAGNOSIS — D649 Anemia, unspecified: Secondary | ICD-10-CM

## 2022-03-08 LAB — CBC
HCT: 22.6 % — ABNORMAL LOW (ref 39.0–52.0)
Hemoglobin: 7.4 g/dL — ABNORMAL LOW (ref 13.0–17.0)
MCH: 31.5 pg (ref 26.0–34.0)
MCHC: 32.7 g/dL (ref 30.0–36.0)
MCV: 96.2 fL (ref 80.0–100.0)
Platelets: 512 10*3/uL — ABNORMAL HIGH (ref 150–400)
RBC: 2.35 MIL/uL — ABNORMAL LOW (ref 4.22–5.81)
RDW: 31.9 % — ABNORMAL HIGH (ref 11.5–15.5)
WBC: 4.8 10*3/uL (ref 4.0–10.5)
nRBC: 0.8 % — ABNORMAL HIGH (ref 0.0–0.2)

## 2022-03-08 MED ORDER — EPOETIN ALFA-EPBX 20000 UNIT/ML IJ SOLN
20000.0000 [IU] | Freq: Once | INTRAMUSCULAR | Status: AC
  Administered 2022-03-08: 20000 [IU] via SUBCUTANEOUS
  Filled 2022-03-08: qty 1

## 2022-03-08 MED ORDER — EPOETIN ALFA-EPBX 40000 UNIT/ML IJ SOLN
40000.0000 [IU] | Freq: Once | INTRAMUSCULAR | Status: AC
  Administered 2022-03-08: 40000 [IU] via SUBCUTANEOUS
  Filled 2022-03-08: qty 1

## 2022-03-08 NOTE — Patient Instructions (Signed)
[facility]  Discharge Instructions: Thank you for choosing [facility] to provide your oncology and hematology care.  If you have a lab appointment with the [facility], please come in thru the Main Entrance and check in at the main information desk.  Wear comfortable clothing and clothing appropriate for easy access to any Portacath or PICC line.   We strive to give you quality time with your provider. You may need to reschedule your appointment if you arrive late (15 or more minutes).  Arriving late affects you and other patients whose appointments are after yours.  Also, if you miss three or more appointments without notifying the office, you may be dismissed from the clinic at the provider's discretion.      For prescription refill requests, have your pharmacy contact our office and allow 72 hours for refills to be completed.    Today you received the following chemotherapy and/or immunotherapy agents retacrit      To help prevent nausea and vomiting after your treatment, we encourage you to take your nausea medication as directed.  BELOW ARE SYMPTOMS THAT SHOULD BE REPORTED IMMEDIATELY: *FEVER GREATER THAN 100.4 F (38 C) OR HIGHER *CHILLS OR SWEATING *NAUSEA AND VOMITING THAT IS NOT CONTROLLED WITH YOUR NAUSEA MEDICATION *UNUSUAL SHORTNESS OF BREATH *UNUSUAL BRUISING OR BLEEDING *URINARY PROBLEMS (pain or burning when urinating, or frequent urination) *BOWEL PROBLEMS (unusual diarrhea, constipation, pain near the anus) TENDERNESS IN MOUTH AND THROAT WITH OR WITHOUT PRESENCE OF ULCERS (sore throat, sores in mouth, or a toothache) UNUSUAL RASH, SWELLING OR PAIN  UNUSUAL VAGINAL DISCHARGE OR ITCHING   Items with * indicate a potential emergency and should be followed up as soon as possible or go to the Emergency Department if any problems should occur.  Please show the CHEMOTHERAPY ALERT CARD or IMMUNOTHERAPY ALERT CARD at check-in to the Emergency  Department and triage nurse.  Should you have questions after your visit or need to cancel or reschedule your appointment, please contact [facility] [phone]  and follow the prompts.  Office hours are 8:00 a.m. to 4:30 p.m. Monday - Friday. Please note that voicemails left after 4:00 p.m. may not be returned until the following business day.  We are closed weekends and major holidays. You have access to a nurse at all times for urgent questions. Please call the main number to the clinic [phone] and follow the prompts.  For any non-urgent questions, you may also contact your provider using MyChart. We now offer e-Visits for anyone 30 and older to request care online for non-urgent symptoms. For details visit [email or web address].   Also download the MyChart app! Go to the app store, search "MyChart", open the app, select [facility], and log in with your MyChart username and password.  Masks are optional in the cancer centers. If you would like for your care team to wear a mask while they are taking care of you, please let them know. For doctor visits, patients may have with them one support person who is at least 63 years old. At this time, visitors are not allowed in the infusion area.

## 2022-03-08 NOTE — Progress Notes (Signed)
Stephen Watts presents today for Retacrit injection per the provider's orders.  Stable during administration without incident; injection site WNL; see MAR for injection details.  Patient tolerated procedure well and without incident.  No questions or complaints noted at this time.

## 2022-03-15 ENCOUNTER — Inpatient Hospital Stay: Payer: 59

## 2022-03-15 ENCOUNTER — Other Ambulatory Visit (HOSPITAL_COMMUNITY)
Admission: RE | Admit: 2022-03-15 | Discharge: 2022-03-15 | Disposition: A | Discharge status: Home / Self Care | Payer: 59 | Source: Ambulatory Visit | Attending: Hematology | Admitting: Hematology

## 2022-03-15 VITALS — BP 152/64 | HR 89 | Temp 98.2°F | Resp 20

## 2022-03-15 DIAGNOSIS — D649 Anemia, unspecified: Secondary | ICD-10-CM

## 2022-03-15 DIAGNOSIS — D469 Myelodysplastic syndrome, unspecified: Secondary | ICD-10-CM

## 2022-03-15 DIAGNOSIS — Z79899 Other long term (current) drug therapy: Secondary | ICD-10-CM | POA: Insufficient documentation

## 2022-03-15 DIAGNOSIS — D75839 Thrombocytosis, unspecified: Secondary | ICD-10-CM | POA: Insufficient documentation

## 2022-03-15 DIAGNOSIS — D461 Refractory anemia with ring sideroblasts: Secondary | ICD-10-CM | POA: Insufficient documentation

## 2022-03-15 DIAGNOSIS — I10 Essential (primary) hypertension: Secondary | ICD-10-CM | POA: Insufficient documentation

## 2022-03-15 LAB — CBC
HCT: 21.1 % — ABNORMAL LOW (ref 39.0–52.0)
HCT: 20.7 % — ABNORMAL LOW (ref 39.0–52.0)
Hemoglobin: 6.9 g/dL — CL (ref 13.0–17.0)
Hemoglobin: 6.7 g/dL — CL (ref 13.0–17.0)
MCH: 31.2 pg (ref 26.0–34.0)
MCH: 31 pg (ref 26.0–34.0)
MCHC: 32.7 g/dL (ref 30.0–36.0)
MCHC: 32.4 g/dL (ref 30.0–36.0)
MCV: 95.5 fL (ref 80.0–100.0)
MCV: 95.8 fL (ref 80.0–100.0)
Platelets: 492 10*3/uL — ABNORMAL HIGH (ref 150–400)
Platelets: 541 10*3/uL — ABNORMAL HIGH (ref 150–400)
RBC: 2.21 MIL/uL — ABNORMAL LOW (ref 4.22–5.81)
RBC: 2.16 MIL/uL — ABNORMAL LOW (ref 4.22–5.81)
RDW: 32.3 % — ABNORMAL HIGH (ref 11.5–15.5)
RDW: 32.6 % — ABNORMAL HIGH (ref 11.5–15.5)
WBC: 5.5 10*3/uL (ref 4.0–10.5)
WBC: 5.6 10*3/uL (ref 4.0–10.5)
nRBC: 1.5 % — ABNORMAL HIGH (ref 0.0–0.2)
nRBC: 1.4 % — ABNORMAL HIGH (ref 0.0–0.2)

## 2022-03-15 LAB — PREPARE RBC (CROSSMATCH)

## 2022-03-15 MED ORDER — EPOETIN ALFA-EPBX 40000 UNIT/ML IJ SOLN
40000.0000 [IU] | Freq: Once | INTRAMUSCULAR | Status: AC
  Administered 2022-03-15: 40000 [IU] via SUBCUTANEOUS
  Filled 2022-03-15: qty 1

## 2022-03-15 MED ORDER — EPOETIN ALFA-EPBX 20000 UNIT/ML IJ SOLN
20000.0000 [IU] | Freq: Once | INTRAMUSCULAR | Status: AC
  Administered 2022-03-15: 20000 [IU] via SUBCUTANEOUS
  Filled 2022-03-15: qty 1

## 2022-03-15 NOTE — Patient Instructions (Signed)
[facility]  Discharge Instructions: Thank you for choosing [facility] to provide your oncology and hematology care.  If you have a lab appointment with the [facility], please come in thru the Main Entrance and check in at the main information desk.  Wear comfortable clothing and clothing appropriate for easy access to any Portacath or PICC line.   We strive to give you quality time with your provider. You may need to reschedule your appointment if you arrive late (15 or more minutes).  Arriving late affects you and other patients whose appointments are after yours.  Also, if you miss three or more appointments without notifying the office, you may be dismissed from the clinic at the provider's discretion.      For prescription refill requests, have your pharmacy contact our office and allow 72 hours for refills to be completed.    Today you received the following chemotherapy and/or immunotherapy agents retacrit.  Epoetin Alfa Injection What is this medication? EPOETIN ALFA (e POE e tin AL fa) treats low levels of red blood cells (anemia) caused by kidney disease, chemotherapy, or HIV medications. It can also be used in people who are at risk for blood loss during surgery. It works by Building control surveyor make more red blood cells, which reduces the need for blood transfusions. This medicine may be used for other purposes; ask your health care provider or pharmacist if you have questions. COMMON BRAND NAME(S): Epogen, Procrit, Retacrit What should I tell my care team before I take this medication? They need to know if you have any of these conditions: Blood clots Cancer Heart disease High blood pressure On dialysis Seizures Stroke An unusual or allergic reaction to epoetin alfa, albumin, benzyl alcohol, other medications, foods, dyes, or preservatives Pregnant or trying to get pregnant Breast-feeding How should I use this medication? This medication  is injected into a vein or under the skin. It is usually given by your care team in a hospital or clinic setting. It may also be given at home. If you get this medication at home, you will be taught how to prepare and give it. Use exactly as directed. Take it as directed on the prescription label at the same time every day. Keep taking it unless your care team tells you to stop. It is important that you put your used needles and syringes in a special sharps container. Do not put them in a trash can. If you do not have a sharps container, call your pharmacist or care team to get one. A special MedGuide will be given to you by the pharmacist with each prescription and refill. Be sure to read this information carefully each time. Talk to your care team about the use of this medication in children. While this medication may be used in children as young as 1 month of age for selected conditions, precautions do apply. Overdosage: If you think you have taken too much of this medicine contact a poison control center or emergency room at once. NOTE: This medicine is only for you. Do not share this medicine with others. What if I miss a dose? If you miss a dose, take it as soon as you can. If it is almost time for your next dose, take only that dose. Do not take double or extra doses. What may interact with this medication? Darbepoetin alfa Methoxy polyethylene glycol-epoetin beta This list may not describe all possible interactions. Give your health care provider  a list of all the medicines, herbs, non-prescription drugs, or dietary supplements you use. Also tell them if you smoke, drink alcohol, or use illegal drugs. Some items may interact with your medicine. What should I watch for while using this medication? Visit your care team for regular checks on your progress. Check your blood pressure as directed. Know what your blood pressure should be and when to contact your care team. Your condition will be  monitored carefully while you are receiving this medication. You may need blood work while taking this medication. What side effects may I notice from receiving this medication? Side effects that you should report to your care team as soon as possible: Allergic reactions--skin rash, itching, hives, swelling of the face, lips, tongue, or throat Blood clot--pain, swelling, or warmth in the leg, shortness of breath, chest pain Heart attack--pain or tightness in the chest, shoulders, arms, or jaw, nausea, shortness of breath, cold or clammy skin, feeling faint or lightheaded Increase in blood pressure Rash, fever, and swollen lymph nodes Redness, blistering, peeling, or loosening of the skin, including inside the mouth Seizures Stroke--sudden numbness or weakness of the face, arm, or leg, trouble speaking, confusion, trouble walking, loss of balance or coordination, dizziness, severe headache, change in vision Side effects that usually do not require medical attention (report to your care team if they continue or are bothersome): Bone, joint, or muscle pain Cough Headache Nausea Pain, redness, or irritation at injection site This list may not describe all possible side effects. Call your doctor for medical advice about side effects. You may report side effects to FDA at 1-800-FDA-1088. Where should I keep my medication? Keep out of the reach of children and pets. Store in a refrigerator. Do not freeze. Do not shake. Protect from light. Keep this medication in the original container until you are ready to take it. See product for storage information. Get rid of any unused medication after the expiration date. To get rid of medications that are no longer needed or have expired: Take the medication to a medication take-back program. Check with your pharmacy or law enforcement to find a location. If you cannot return the medication, ask your pharmacist or care team how to get rid of the medication  safely. NOTE: This sheet is a summary. It may not cover all possible information. If you have questions about this medicine, talk to your doctor, pharmacist, or health care provider.  2023 Elsevier/Gold Standard (2021-11-05 00:00:00)       To help prevent nausea and vomiting after your treatment, we encourage you to take your nausea medication as directed.  BELOW ARE SYMPTOMS THAT SHOULD BE REPORTED IMMEDIATELY: *FEVER GREATER THAN 100.4 F (38 C) OR HIGHER *CHILLS OR SWEATING *NAUSEA AND VOMITING THAT IS NOT CONTROLLED WITH YOUR NAUSEA MEDICATION *UNUSUAL SHORTNESS OF BREATH *UNUSUAL BRUISING OR BLEEDING *URINARY PROBLEMS (pain or burning when urinating, or frequent urination) *BOWEL PROBLEMS (unusual diarrhea, constipation, pain near the anus) TENDERNESS IN MOUTH AND THROAT WITH OR WITHOUT PRESENCE OF ULCERS (sore throat, sores in mouth, or a toothache) UNUSUAL RASH, SWELLING OR PAIN  UNUSUAL VAGINAL DISCHARGE OR ITCHING   Items with * indicate a potential emergency and should be followed up as soon as possible or go to the Emergency Department if any problems should occur.  Please show the CHEMOTHERAPY ALERT CARD or IMMUNOTHERAPY ALERT CARD at check-in to the Emergency Department and triage nurse.  Should you have questions after your visit or need to cancel or reschedule  your appointment, please contact [facility] [phone]  and follow the prompts.  Office hours are 8:00 a.m. to 4:30 p.m. Monday - Friday. Please note that voicemails left after 4:00 p.m. may not be returned until the following business day.  We are closed weekends and major holidays. You have access to a nurse at all times for urgent questions. Please call the main number to the clinic [phone] and follow the prompts.  For any non-urgent questions, you may also contact your provider using MyChart. We now offer e-Visits for anyone 26 and older to request care online for non-urgent symptoms.  For details visit [email or web address].   Also download the MyChart app! Go to the app store, search "MyChart", open the app, select [facility], and log in with your MyChart username and password.  Masks are optional in the cancer centers. If you would like for your care team to wear a mask while they are taking care of you, please let them know. For doctor visits, patients may have with them one support person who is at least 63 years old. At this time, visitors are not allowed in the infusion area.

## 2022-03-15 NOTE — Progress Notes (Signed)
CRITICAL VALUE ALERT Critical value received:  HGB 6.9 Date of notification:  03-15-2022 Time of notification: 12:51 pm Critical value read back:  Yes.   Nurse who received alert:  B. Hillary Schwegler RN MD notified time and response:  Katragadda @ 13:20 pm. Patient to receive one unit of blood tomorrow.

## 2022-03-15 NOTE — Progress Notes (Signed)
CRITICAL VALUE ALERT Critical value received:  HGB 6.7 Date of notification:  03-15-2022 Time of notification: 15:20 pm.  Critical value read back:  Yes.   Nurse who received alert:  B. Garth Diffley RN MD notified time and response:  Notified earlier of hgb 6.9. Called by S. Thompson 08/07 12:51pm.

## 2022-03-15 NOTE — Progress Notes (Signed)
Patient presents today for Retacrit injection. Hemoglobin reviewed prior to administration. VSS. Injection tolerated without incident or complaint. See MAR for details. Patient stable during and after injection.  Patient discharged in satisfactory condition with no s/s of distress noted.    

## 2022-03-16 ENCOUNTER — Inpatient Hospital Stay: Payer: 59

## 2022-03-16 DIAGNOSIS — D649 Anemia, unspecified: Secondary | ICD-10-CM

## 2022-03-16 DIAGNOSIS — D469 Myelodysplastic syndrome, unspecified: Secondary | ICD-10-CM | POA: Diagnosis not present

## 2022-03-16 MED ORDER — DIPHENHYDRAMINE HCL 25 MG PO CAPS
25.0000 mg | ORAL_CAPSULE | Freq: Once | ORAL | Status: AC
  Administered 2022-03-16: 25 mg via ORAL
  Filled 2022-03-16: qty 1

## 2022-03-16 MED ORDER — ACETAMINOPHEN 325 MG PO TABS
650.0000 mg | ORAL_TABLET | Freq: Once | ORAL | Status: AC
  Administered 2022-03-16: 650 mg via ORAL
  Filled 2022-03-16: qty 2

## 2022-03-16 MED ORDER — SODIUM CHLORIDE 0.9% IV SOLUTION
250.0000 mL | Freq: Once | INTRAVENOUS | Status: AC
  Administered 2022-03-16: 250 mL via INTRAVENOUS

## 2022-03-16 NOTE — Progress Notes (Signed)
Patient presents today for 1 unit of PRBC's per physicians order. Consent and attestation reviewed. Patient has no complaints of any changes since his last visit. MAR reviewed and updated.    1 unit of blood given today per MD orders. Tolerated infusion without adverse affects. Vital signs stable. No complaints at this time. Discharged from clinic ambulatory in stable condition. Alert and oriented x 3. F/U with [facility] as scheduled.

## 2022-03-16 NOTE — Patient Instructions (Signed)
[facility]  Discharge Instructions: Thank you for choosing [facility] to provide your oncology and hematology care.  If you have a lab appointment with the [facility], please come in thru the Main Entrance and check in at the main information desk.  Wear comfortable clothing and clothing appropriate for easy access to any Portacath or PICC line.   We strive to give you quality time with your provider. You may need to reschedule your appointment if you arrive late (15 or more minutes).  Arriving late affects you and other patients whose appointments are after yours.  Also, if you miss three or more appointments without notifying the office, you may be dismissed from the clinic at the provider's discretion.      For prescription refill requests, have your pharmacy contact our office and allow 72 hours for refills to be completed.    Today you received the following chemotherapy and/or immunotherapy agents 1 Unit of PRBC's.  Blood Transfusion, Adult, Care After After a blood transfusion, it is common to have: Bruising and soreness at the IV site. A headache. Follow these instructions at home: Your doctor may give you more instructions. If you have problems, contact your doctor. Insertion site care     Follow instructions from your doctor about how to take care of your insertion site. This is where an IV tube was put into your vein. Make sure you: Wash your hands with soap and water for at least 20 seconds before and after you change your bandage. If you cannot use soap and water, use hand sanitizer. Change your bandage as told by your doctor. Check your insertion site every day for signs of infection. Check for: Redness, swelling, or pain. Bleeding from the site. Warmth. Pus or a bad smell. General instructions Take over-the-counter and prescription medicines only as told by your doctor. Rest as told by your doctor. Go back to your normal  activities as told by your doctor. Keep all follow-up visits. You may need to have tests at certain times to check your blood. Contact a doctor if: You have itching or red, swollen areas of skin (hives). You have a fever or chills. You have pain in the head, back, or chest. You feel worried or nervous (anxious). You feel weak after doing your normal activities. You have any of these problems at the insertion site: Redness, swelling, warmth, or pain. Bleeding that does not stop with pressure. Pus or a bad smell. If you received your blood transfusion in an outpatient setting, you will be told whom to contact to report any reactions. Get help right away if: You have signs of a serious reaction. This may be coming from an allergy or the body's defense system (immune system). Signs include: Trouble breathing or shortness of breath. Swelling of the face or feeling warm (flushed). A widespread rash. Dark pee (urine) or blood in the pee. Fast heartbeat. These symptoms may be an emergency. Get help right away. Call 911. Do not wait to see if the symptoms will go away. Do not drive yourself to the hospital. Summary Bruising and soreness at the IV site are common. Check your insertion site every day for signs of infection. Rest as told by your doctor. Go back to your normal activities as told by your doctor. Get help right away if you have signs of a serious reaction. This information is not intended to replace advice given to you by your health care provider. Make  sure you discuss any questions you have with your health care provider. Document Revised: 10/23/2021 Document Reviewed: 10/23/2021 Elsevier Patient Education  [place].       To help prevent nausea and vomiting after your treatment, we encourage you to take your nausea medication as directed.  BELOW ARE SYMPTOMS THAT SHOULD BE REPORTED IMMEDIATELY: *FEVER GREATER THAN 100.4 F (38 C) OR HIGHER *CHILLS OR  SWEATING *NAUSEA AND VOMITING THAT IS NOT CONTROLLED WITH YOUR NAUSEA MEDICATION *UNUSUAL SHORTNESS OF BREATH *UNUSUAL BRUISING OR BLEEDING *URINARY PROBLEMS (pain or burning when urinating, or frequent urination) *BOWEL PROBLEMS (unusual diarrhea, constipation, pain near the anus) TENDERNESS IN MOUTH AND THROAT WITH OR WITHOUT PRESENCE OF ULCERS (sore throat, sores in mouth, or a toothache) UNUSUAL RASH, SWELLING OR PAIN  UNUSUAL VAGINAL DISCHARGE OR ITCHING   Items with * indicate a potential emergency and should be followed up as soon as possible or go to the Emergency Department if any problems should occur.  Please show the CHEMOTHERAPY ALERT CARD or IMMUNOTHERAPY ALERT CARD at check-in to the Emergency Department and triage nurse.  Should you have questions after your visit or need to cancel or reschedule your appointment, please contact [facility] [phone]  and follow the prompts.  Office hours are 8:00 a.m. to 4:30 p.m. Monday - Friday. Please note that voicemails left after 4:00 p.m. may not be returned until the following business day.  We are closed weekends and major holidays. You have access to a nurse at all times for urgent questions. Please call the main number to the clinic [phone] and follow the prompts.  For any non-urgent questions, you may also contact your provider using MyChart. We now offer e-Visits for anyone 63 and older to request care online for non-urgent symptoms. For details visit [email or web address].   Also download the MyChart app! Go to the app store, search "MyChart", open the app, select [facility], and log in with your MyChart username and password.  Masks are optional in the cancer centers. If you would like for your care team to wear a mask while they are taking care of you, please let them know. For doctor visits, patients may have with them one support person who is at least 63 years old. At this time, visitors are not  allowed in the infusion area.

## 2022-03-17 LAB — TYPE AND SCREEN
ABO/RH(D): O POS
Antibody Screen: NEGATIVE
Unit division: 0

## 2022-03-17 LAB — BPAM RBC
Blood Product Expiration Date: 202309062359
ISSUE DATE / TIME: 202308081024
Unit Type and Rh: 5100

## 2022-03-22 ENCOUNTER — Inpatient Hospital Stay: Payer: 59

## 2022-03-22 ENCOUNTER — Other Ambulatory Visit: Payer: Self-pay

## 2022-03-22 ENCOUNTER — Inpatient Hospital Stay (HOSPITAL_COMMUNITY): Payer: 59 | Attending: Hematology

## 2022-03-22 VITALS — BP 163/78 | HR 70 | Temp 96.4°F | Resp 18

## 2022-03-22 DIAGNOSIS — D469 Myelodysplastic syndrome, unspecified: Secondary | ICD-10-CM

## 2022-03-22 DIAGNOSIS — D649 Anemia, unspecified: Secondary | ICD-10-CM

## 2022-03-22 DIAGNOSIS — Z79899 Other long term (current) drug therapy: Secondary | ICD-10-CM | POA: Insufficient documentation

## 2022-03-22 DIAGNOSIS — D75839 Thrombocytosis, unspecified: Secondary | ICD-10-CM | POA: Insufficient documentation

## 2022-03-22 LAB — CBC
HCT: 27 % — ABNORMAL LOW (ref 39.0–52.0)
Hemoglobin: 9 g/dL — ABNORMAL LOW (ref 13.0–17.0)
MCH: 32.4 pg (ref 26.0–34.0)
MCHC: 33.3 g/dL (ref 30.0–36.0)
MCV: 97.1 fL (ref 80.0–100.0)
Platelets: 431 10*3/uL — ABNORMAL HIGH (ref 150–400)
RBC: 2.78 MIL/uL — ABNORMAL LOW (ref 4.22–5.81)
RDW: 28.5 % — ABNORMAL HIGH (ref 11.5–15.5)
WBC: 3.9 10*3/uL — ABNORMAL LOW (ref 4.0–10.5)
nRBC: 0.8 % — ABNORMAL HIGH (ref 0.0–0.2)

## 2022-03-22 LAB — SAMPLE TO BLOOD BANK

## 2022-03-22 MED ORDER — EPOETIN ALFA-EPBX 40000 UNIT/ML IJ SOLN
40000.0000 [IU] | Freq: Once | INTRAMUSCULAR | Status: AC
  Administered 2022-03-22: 40000 [IU] via SUBCUTANEOUS
  Filled 2022-03-22: qty 1

## 2022-03-22 MED ORDER — EPOETIN ALFA-EPBX 20000 UNIT/ML IJ SOLN
20000.0000 [IU] | Freq: Once | INTRAMUSCULAR | Status: AC
  Administered 2022-03-22: 20000 [IU] via SUBCUTANEOUS
  Filled 2022-03-22: qty 1

## 2022-03-22 NOTE — Patient Instructions (Signed)
[facility]  Discharge Instructions: Thank you for choosing [facility] to provide your oncology and hematology care.  If you have a lab appointment with the [facility], please come in thru the Main Entrance and check in at the main information desk.  Wear comfortable clothing and clothing appropriate for easy access to any Portacath or PICC line.   We strive to give you quality time with your provider. You may need to reschedule your appointment if you arrive late (15 or more minutes).  Arriving late affects you and other patients whose appointments are after yours.  Also, if you miss three or more appointments without notifying the office, you may be dismissed from the clinic at the provider's discretion.      For prescription refill requests, have your pharmacy contact our office and allow 72 hours for refills to be completed.    Today you received the following Retacrit injection. Per Tarri Abernethy PA, maintain a low sodium diet, take BP medications as directed and recheck BP in 1 hour at home, if BP is > 160/90 call PCP, return as scheduled.  Epoetin Alfa Injection What is this medication? EPOETIN ALFA (e POE e tin AL fa) treats low levels of red blood cells (anemia) caused by kidney disease, chemotherapy, or HIV medications. It can also be used in people who are at risk for blood loss during surgery. It works by Building control surveyor make more red blood cells, which reduces the need for blood transfusions. This medicine may be used for other purposes; ask your health care provider or pharmacist if you have questions. COMMON BRAND NAME(S): Epogen, Procrit, Retacrit What should I tell my care team before I take this medication? They need to know if you have any of these conditions: Blood clots Cancer Heart disease High blood pressure On dialysis Seizures Stroke An unusual or allergic reaction to epoetin alfa, albumin, benzyl alcohol, other  medications, foods, dyes, or preservatives Pregnant or trying to get pregnant Breast-feeding How should I use this medication? This medication is injected into a vein or under the skin. It is usually given by your care team in a hospital or clinic setting. It may also be given at home. If you get this medication at home, you will be taught how to prepare and give it. Use exactly as directed. Take it as directed on the prescription label at the same time every day. Keep taking it unless your care team tells you to stop. It is important that you put your used needles and syringes in a special sharps container. Do not put them in a trash can. If you do not have a sharps container, call your pharmacist or care team to get one. A special MedGuide will be given to you by the pharmacist with each prescription and refill. Be sure to read this information carefully each time. Talk to your care team about the use of this medication in children. While this medication may be used in children as young as 58 month of age for selected conditions, precautions do apply. Overdosage: If you think you have taken too much of this medicine contact a poison control center or emergency room at once. NOTE: This medicine is only for you. Do not share this medicine with others. What if I miss a dose? If you miss a dose, take it as soon as you can. If it is almost time for your next dose, take only that dose. Do not take  double or extra doses. What may interact with this medication? Darbepoetin alfa Methoxy polyethylene glycol-epoetin beta This list may not describe all possible interactions. Give your health care provider a list of all the medicines, herbs, non-prescription drugs, or dietary supplements you use. Also tell them if you smoke, drink alcohol, or use illegal drugs. Some items may interact with your medicine. What should I watch for while using this medication? Visit your care team for regular checks on your  progress. Check your blood pressure as directed. Know what your blood pressure should be and when to contact your care team. Your condition will be monitored carefully while you are receiving this medication. You may need blood work while taking this medication. What side effects may I notice from receiving this medication? Side effects that you should report to your care team as soon as possible: Allergic reactions--skin rash, itching, hives, swelling of the face, lips, tongue, or throat Blood clot--pain, swelling, or warmth in the leg, shortness of breath, chest pain Heart attack--pain or tightness in the chest, shoulders, arms, or jaw, nausea, shortness of breath, cold or clammy skin, feeling faint or lightheaded Increase in blood pressure Rash, fever, and swollen lymph nodes Redness, blistering, peeling, or loosening of the skin, including inside the mouth Seizures Stroke--sudden numbness or weakness of the face, arm, or leg, trouble speaking, confusion, trouble walking, loss of balance or coordination, dizziness, severe headache, change in vision Side effects that usually do not require medical attention (report to your care team if they continue or are bothersome): Bone, joint, or muscle pain Cough Headache Nausea Pain, redness, or irritation at injection site This list may not describe all possible side effects. Call your doctor for medical advice about side effects. You may report side effects to FDA at 1-800-FDA-1088. Where should I keep my medication? Keep out of the reach of children and pets. Store in a refrigerator. Do not freeze. Do not shake. Protect from light. Keep this medication in the original container until you are ready to take it. See product for storage information. Get rid of any unused medication after the expiration date. To get rid of medications that are no longer needed or have expired: Take the medication to a medication take-back program. Check with your  pharmacy or law enforcement to find a location. If you cannot return the medication, ask your pharmacist or care team how to get rid of the medication safely. NOTE: This sheet is a summary. It may not cover all possible information. If you have questions about this medicine, talk to your doctor, pharmacist, or health care provider.  2023 Elsevier/Gold Standard (2021-11-05 00:00:00)    To help prevent nausea and vomiting after your treatment, we encourage you to take your nausea medication as directed.  BELOW ARE SYMPTOMS THAT SHOULD BE REPORTED IMMEDIATELY: *FEVER GREATER THAN 100.4 F (38 C) OR HIGHER *CHILLS OR SWEATING *NAUSEA AND VOMITING THAT IS NOT CONTROLLED WITH YOUR NAUSEA MEDICATION *UNUSUAL SHORTNESS OF BREATH *UNUSUAL BRUISING OR BLEEDING *URINARY PROBLEMS (pain or burning when urinating, or frequent urination) *BOWEL PROBLEMS (unusual diarrhea, constipation, pain near the anus) TENDERNESS IN MOUTH AND THROAT WITH OR WITHOUT PRESENCE OF ULCERS (sore throat, sores in mouth, or a toothache) UNUSUAL RASH, SWELLING OR PAIN  UNUSUAL VAGINAL DISCHARGE OR ITCHING   Items with * indicate a potential emergency and should be followed up as soon as possible or go to the Emergency Department if any problems should occur.  Please show the CHEMOTHERAPY ALERT CARD or  IMMUNOTHERAPY ALERT CARD at check-in to the Emergency Department and triage nurse.  Should you have questions after your visit or need to cancel or reschedule your appointment, please contact [facility] [phone]  and follow the prompts.  Office hours are 8:00 a.m. to 4:30 p.m. Monday - Friday. Please note that voicemails left after 4:00 p.m. may not be returned until the following business day.  We are closed weekends and major holidays. You have access to a nurse at all times for urgent questions. Please call the main number to the clinic [phone] and follow the prompts.  For any non-urgent  questions, you may also contact your provider using MyChart. We now offer e-Visits for anyone 27 and older to request care online for non-urgent symptoms. For details visit [email or web address].   Also download the MyChart app! Go to the app store, search "MyChart", open the app, select [facility], and log in with your MyChart username and password.  Masks are optional in the cancer centers. If you would like for your care team to wear a mask while they are taking care of you, please let them know. For doctor visits, patients may have with them one support person who is at least 63 years old. At this time, visitors are not allowed in the infusion area.

## 2022-03-22 NOTE — Progress Notes (Signed)
Patient presents today for Retacrit injection, BP 163/78 and 163/81, patient states he had a high sodium breakfast. Tarri Abernethy PA made aware, patient okay for Retacrit injection today. Patient educated on the importance of maintaining a low sodium diet, take BP medications as directed and recheck BP in 1 hour at home, if BP is > 160/90 call PCP, patient verbalizes understanding. Hemoglobin reviewed prior to administration. VSS tolerated without incident or complaint. See MAR for details. Patient stable during and after injection. Patient discharged in satisfactory condition with no s/s of distress noted.

## 2022-03-23 MED ORDER — LORATADINE 10 MG PO TABS
ORAL_TABLET | ORAL | Status: AC
  Filled 2022-03-23: qty 1

## 2022-03-23 MED ORDER — ACETAMINOPHEN 325 MG PO TABS
ORAL_TABLET | ORAL | Status: AC
  Filled 2022-03-23: qty 2

## 2022-03-29 ENCOUNTER — Inpatient Hospital Stay: Payer: 59

## 2022-03-29 ENCOUNTER — Other Ambulatory Visit: Payer: Self-pay

## 2022-03-29 VITALS — BP 151/77 | HR 84 | Temp 96.7°F | Resp 20

## 2022-03-29 DIAGNOSIS — D649 Anemia, unspecified: Secondary | ICD-10-CM

## 2022-03-29 DIAGNOSIS — D469 Myelodysplastic syndrome, unspecified: Secondary | ICD-10-CM

## 2022-03-29 LAB — CBC
HCT: 24.5 % — ABNORMAL LOW (ref 39.0–52.0)
Hemoglobin: 8.1 g/dL — ABNORMAL LOW (ref 13.0–17.0)
MCH: 32.1 pg (ref 26.0–34.0)
MCHC: 33.1 g/dL (ref 30.0–36.0)
MCV: 97.2 fL (ref 80.0–100.0)
Platelets: 530 10*3/uL — ABNORMAL HIGH (ref 150–400)
RBC: 2.52 MIL/uL — ABNORMAL LOW (ref 4.22–5.81)
RDW: 28.5 % — ABNORMAL HIGH (ref 11.5–15.5)
WBC: 5.5 10*3/uL (ref 4.0–10.5)
nRBC: 0.7 % — ABNORMAL HIGH (ref 0.0–0.2)

## 2022-03-29 LAB — SAMPLE TO BLOOD BANK

## 2022-03-29 MED ORDER — EPOETIN ALFA-EPBX 20000 UNIT/ML IJ SOLN
20000.0000 [IU] | Freq: Once | INTRAMUSCULAR | Status: AC
  Administered 2022-03-29: 20000 [IU] via SUBCUTANEOUS
  Filled 2022-03-29: qty 1

## 2022-03-29 MED ORDER — EPOETIN ALFA-EPBX 40000 UNIT/ML IJ SOLN
40000.0000 [IU] | Freq: Once | INTRAMUSCULAR | Status: AC
  Administered 2022-03-29: 40000 [IU] via SUBCUTANEOUS
  Filled 2022-03-29: qty 1

## 2022-03-29 NOTE — Patient Instructions (Signed)
[facility]  Discharge Instructions: Thank you for choosing [facility] to provide your oncology and hematology care.  If you have a lab appointment with the Cancer Center, please come in thru the Main Entrance and check in at the main information desk.  Wear comfortable clothing and clothing appropriate for easy access to any Portacath or PICC line.   We strive to give you quality time with your provider. You may need to reschedule your appointment if you arrive late (15 or more minutes).  Arriving late affects you and other patients whose appointments are after yours.  Also, if you miss three or more appointments without notifying the office, you may be dismissed from the clinic at the provider's discretion.      For prescription refill requests, have your pharmacy contact our office and allow 72 hours for refills to be completed.     To help prevent nausea and vomiting after your treatment, we encourage you to take your nausea medication as directed.  BELOW ARE SYMPTOMS THAT SHOULD BE REPORTED IMMEDIATELY: *FEVER GREATER THAN 100.4 F (38 C) OR HIGHER *CHILLS OR SWEATING *NAUSEA AND VOMITING THAT IS NOT CONTROLLED WITH YOUR NAUSEA MEDICATION *UNUSUAL SHORTNESS OF BREATH *UNUSUAL BRUISING OR BLEEDING *URINARY PROBLEMS (pain or burning when urinating, or frequent urination) *BOWEL PROBLEMS (unusual diarrhea, constipation, pain near the anus) TENDERNESS IN MOUTH AND THROAT WITH OR WITHOUT PRESENCE OF ULCERS (sore throat, sores in mouth, or a toothache) UNUSUAL RASH, SWELLING OR PAIN  UNUSUAL VAGINAL DISCHARGE OR ITCHING   Items with * indicate a potential emergency and should be followed up as soon as possible or go to the Emergency Department if any problems should occur.  Please show the CHEMOTHERAPY ALERT CARD or IMMUNOTHERAPY ALERT CARD at check-in to the Emergency Department and triage nurse.  Should you have questions after your visit or need to  cancel or reschedule your appointment, please contact [facility] AT  [phone]  and follow the prompts.  Office hours are 8:00 a.m. to 4:30 p.m. Monday - Friday. Please note that voicemails left after 4:00 p.m. may not be returned until the following business day.  We are closed weekends and major holidays. You have access to a nurse at all times for urgent questions. Please call the main number to the clinic [phone] and follow the prompts.  For any non-urgent questions, you may also contact your provider using MyChart. We now offer e-Visits for anyone 18 and older to request care online for non-urgent symptoms. For details visit [email or web address].   Also download the MyChart app! Go to the app store, search "MyChart", open the app, select , and log in with your MyChart username and password.  Masks are optional in the cancer centers. If you would like for your care team to wear a mask while they are taking care of you, please let them know. You may have one support person who is at least 63 years old accompany you for your appointments.  

## 2022-03-29 NOTE — Progress Notes (Signed)
Na

## 2022-03-29 NOTE — Progress Notes (Signed)
Patient tolerated Retacrit injection with no complaints voiced.  Site clean and dry with no bruising or swelling noted.  No complaints of pain.  Hemoglobin today is 8.1.  Discharged with vital signs stable and no signs or symptoms of distress noted.

## 2022-03-31 DIAGNOSIS — I10 Essential (primary) hypertension: Secondary | ICD-10-CM | POA: Diagnosis not present

## 2022-04-02 ENCOUNTER — Encounter (HOSPITAL_COMMUNITY): Payer: Self-pay | Admitting: Hematology

## 2022-04-05 ENCOUNTER — Inpatient Hospital Stay: Payer: 59

## 2022-04-05 ENCOUNTER — Inpatient Hospital Stay (HOSPITAL_BASED_OUTPATIENT_CLINIC_OR_DEPARTMENT_OTHER): Payer: 59 | Admitting: Hematology

## 2022-04-05 ENCOUNTER — Encounter (HOSPITAL_COMMUNITY): Payer: Self-pay | Admitting: Hematology

## 2022-04-05 VITALS — BP 143/73 | HR 74 | Temp 96.3°F | Resp 20

## 2022-04-05 DIAGNOSIS — D469 Myelodysplastic syndrome, unspecified: Secondary | ICD-10-CM

## 2022-04-05 DIAGNOSIS — D649 Anemia, unspecified: Secondary | ICD-10-CM

## 2022-04-05 LAB — CBC WITH DIFFERENTIAL/PLATELET
Abs Immature Granulocytes: 0.01 10*3/uL (ref 0.00–0.07)
Basophils Absolute: 0 10*3/uL (ref 0.0–0.1)
Basophils Relative: 1 %
Eosinophils Absolute: 0.2 10*3/uL (ref 0.0–0.5)
Eosinophils Relative: 4 %
HCT: 26.8 % — ABNORMAL LOW (ref 39.0–52.0)
Hemoglobin: 8.8 g/dL — ABNORMAL LOW (ref 13.0–17.0)
Immature Granulocytes: 0 %
Lymphocytes Relative: 39 %
Lymphs Abs: 1.9 10*3/uL (ref 0.7–4.0)
MCH: 31.9 pg (ref 26.0–34.0)
MCHC: 32.8 g/dL (ref 30.0–36.0)
MCV: 97.1 fL (ref 80.0–100.0)
Monocytes Absolute: 0.6 10*3/uL (ref 0.1–1.0)
Monocytes Relative: 11 %
Neutro Abs: 2.2 10*3/uL (ref 1.7–7.7)
Neutrophils Relative %: 45 %
Platelets: 511 10*3/uL — ABNORMAL HIGH (ref 150–400)
RBC: 2.76 MIL/uL — ABNORMAL LOW (ref 4.22–5.81)
RDW: 29.2 % — ABNORMAL HIGH (ref 11.5–15.5)
WBC: 4.8 10*3/uL (ref 4.0–10.5)
nRBC: 0.6 % — ABNORMAL HIGH (ref 0.0–0.2)

## 2022-04-05 LAB — SAMPLE TO BLOOD BANK

## 2022-04-05 LAB — IRON AND TIBC
Iron: 153 ug/dL (ref 45–182)
Saturation Ratios: 69 % — ABNORMAL HIGH (ref 17.9–39.5)
TIBC: 223 ug/dL — ABNORMAL LOW (ref 250–450)
UIBC: 70 ug/dL

## 2022-04-05 LAB — LACTATE DEHYDROGENASE: LDH: 174 U/L (ref 98–192)

## 2022-04-05 LAB — FERRITIN: Ferritin: 1200 ng/mL — ABNORMAL HIGH (ref 24–336)

## 2022-04-05 MED ORDER — EPOETIN ALFA-EPBX 20000 UNIT/ML IJ SOLN
20000.0000 [IU] | Freq: Once | INTRAMUSCULAR | Status: AC
  Administered 2022-04-05: 20000 [IU] via SUBCUTANEOUS
  Filled 2022-04-05: qty 1

## 2022-04-05 MED ORDER — EPOETIN ALFA-EPBX 40000 UNIT/ML IJ SOLN
40000.0000 [IU] | Freq: Once | INTRAMUSCULAR | Status: AC
  Administered 2022-04-05: 40000 [IU] via SUBCUTANEOUS
  Filled 2022-04-05: qty 1

## 2022-04-05 NOTE — Patient Instructions (Signed)
[facility]  Discharge Instructions: Thank you for choosing [facility] to provide your oncology and hematology care.  If you have a lab appointment with the [facility], please come in thru the Main Entrance and check in at the main information desk.  Wear comfortable clothing and clothing appropriate for easy access to any Portacath or PICC line.   We strive to give you quality time with your provider. You may need to reschedule your appointment if you arrive late (15 or more minutes).  Arriving late affects you and other patients whose appointments are after yours.  Also, if you miss three or more appointments without notifying the office, you may be dismissed from the clinic at the provider's discretion.      For prescription refill requests, have your pharmacy contact our office and allow 72 hours for refills to be completed.    Today you received the following chemotherapy and/or immunotherapy agents retacrit.  Epoetin Alfa Injection What is this medication? EPOETIN ALFA (e POE e tin AL fa) treats low levels of red blood cells (anemia) caused by kidney disease, chemotherapy, or HIV medications. It can also be used in people who are at risk for blood loss during surgery. It works by Building control [employer] make more red blood cells, which reduces the need for blood transfusions. This medicine may be used for other purposes; ask your health care provider or pharmacist if you have questions. COMMON BRAND NAME(S): Epogen, Procrit, Retacrit What should I tell my care team before I take this medication? They need to know if you have any of these conditions: Blood clots Cancer Heart disease High blood pressure On dialysis Seizures Stroke An unusual or allergic reaction to epoetin alfa, albumin, benzyl alcohol, other medications, foods, dyes, or preservatives Pregnant or trying to get pregnant Breast-feeding How should I use this medication? This medication  is injected into a vein or under the skin. It is usually given by your care team in a hospital or clinic setting. It may also be given at home. If you get this medication at home, you will be taught how to prepare and give it. Use exactly as directed. Take it as directed on the prescription label at the same time every day. Keep taking it unless your care team tells you to stop. It is important that you put your used needles and syringes in a special sharps container. Do not put them in a trash can. If you do not have a sharps container, call your pharmacist or care team to get one. A special MedGuide will be given to you by the pharmacist with each prescription and refill. Be sure to read this information carefully each time. Talk to your care team about the use of this medication in children. While this medication may be used in children as young as 1 month of age for selected conditions, precautions do apply. Overdosage: If you think you have taken too much of this medicine contact a poison control center or emergency room at once. NOTE: This medicine is only for you. Do not share this medicine with others. What if I miss a dose? If you miss a dose, take it as soon as you can. If it is almost time for your next dose, take only that dose. Do not take double or extra doses. What may interact with this medication? Darbepoetin alfa Methoxy polyethylene glycol-epoetin beta This list may not describe all possible interactions. Give your health care provider  a list of all the medicines, herbs, non-prescription drugs, or dietary supplements you use. Also tell them if you smoke, drink alcohol, or use illegal drugs. Some items may interact with your medicine. What should I watch for while using this medication? Visit your care team for regular checks on your progress. Check your blood pressure as directed. Know what your blood pressure should be and when to contact your care team. Your condition will be  monitored carefully while you are receiving this medication. You may need blood work while taking this medication. What side effects may I notice from receiving this medication? Side effects that you should report to your care team as soon as possible: Allergic reactions--skin rash, itching, hives, swelling of the face, lips, tongue, or throat Blood clot--pain, swelling, or warmth in the leg, shortness of breath, chest pain Heart attack--pain or tightness in the chest, shoulders, arms, or jaw, nausea, shortness of breath, cold or clammy skin, feeling faint or lightheaded Increase in blood pressure Rash, fever, and swollen lymph nodes Redness, blistering, peeling, or loosening of the skin, including inside the mouth Seizures Stroke--sudden numbness or weakness of the face, arm, or leg, trouble speaking, confusion, trouble walking, loss of balance or coordination, dizziness, severe headache, change in vision Side effects that usually do not require medical attention (report to your care team if they continue or are bothersome): Bone, joint, or muscle pain Cough Headache Nausea Pain, redness, or irritation at injection site This list may not describe all possible side effects. Call your doctor for medical advice about side effects. You may report side effects to FDA at 1-800-FDA-1088. Where should I keep my medication? Keep out of the reach of children and pets. Store in a refrigerator. Do not freeze. Do not shake. Protect from light. Keep this medication in the original container until you are ready to take it. See product for storage information. Get rid of any unused medication after the expiration date. To get rid of medications that are no longer needed or have expired: Take the medication to a medication take-back program. Check with your pharmacy or law enforcement to find a location. If you cannot return the medication, ask your pharmacist or care team how to get rid of the medication  safely. NOTE: This sheet is a summary. It may not cover all possible information. If you have questions about this medicine, talk to your doctor, pharmacist, or health care provider.  2023 Elsevier/Gold Standard (2021-11-05 00:00:00)       To help prevent nausea and vomiting after your treatment, we encourage you to take your nausea medication as directed.  BELOW ARE SYMPTOMS THAT SHOULD BE REPORTED IMMEDIATELY: *FEVER GREATER THAN 100.4 F (38 C) OR HIGHER *CHILLS OR SWEATING *NAUSEA AND VOMITING THAT IS NOT CONTROLLED WITH YOUR NAUSEA MEDICATION *UNUSUAL SHORTNESS OF BREATH *UNUSUAL BRUISING OR BLEEDING *URINARY PROBLEMS (pain or burning when urinating, or frequent urination) *BOWEL PROBLEMS (unusual diarrhea, constipation, pain near the anus) TENDERNESS IN MOUTH AND THROAT WITH OR WITHOUT PRESENCE OF ULCERS (sore throat, sores in mouth, or a toothache) UNUSUAL RASH, SWELLING OR PAIN  UNUSUAL VAGINAL DISCHARGE OR ITCHING   Items with * indicate a potential emergency and should be followed up as soon as possible or go to the Emergency Department if any problems should occur.  Please show the CHEMOTHERAPY ALERT CARD or IMMUNOTHERAPY ALERT CARD at check-in to the Emergency Department and triage nurse.  Should you have questions after your visit or need to cancel or reschedule  your appointment, please contact [facility] [phone]  and follow the prompts.  Office hours are 8:00 a.m. to 4:30 p.m. Monday - Friday. Please note that voicemails left after 4:00 p.m. may not be returned until the following business day.  We are closed weekends and major holidays. You have access to a nurse at all times for urgent questions. Please call the main number to the clinic [phone] and follow the prompts.  For any non-urgent questions, you may also contact your provider using MyChart. We now offer e-Visits for anyone 27 and older to request care online for non-urgent symptoms.  For details visit [email or web address].   Also download the MyChart app! Go to the app store, search "MyChart", open the app, select [facility], and log in with your MyChart username and password.  Masks are optional in the cancer centers. If you would like for your care team to wear a mask while they are taking care of you, please let them know. You may have one support person who is at least 63 years old accompany you for your appointments.

## 2022-04-05 NOTE — Progress Notes (Signed)
Patient presents today for Retacrit injection. Hemoglobin reviewed prior to administration. VSS. Injection tolerated without incident or complaint. See MAR for details. Patient stable during and after injection.  Patient discharged in satisfactory condition with no s/s of distress noted.    

## 2022-04-13 ENCOUNTER — Encounter: Payer: Self-pay | Admitting: Hematology

## 2022-04-13 ENCOUNTER — Other Ambulatory Visit: Payer: Self-pay | Admitting: *Deleted

## 2022-04-13 ENCOUNTER — Inpatient Hospital Stay (HOSPITAL_COMMUNITY): Payer: 59 | Attending: Hematology

## 2022-04-13 ENCOUNTER — Inpatient Hospital Stay: Payer: 59

## 2022-04-13 ENCOUNTER — Other Ambulatory Visit: Payer: Self-pay

## 2022-04-13 ENCOUNTER — Inpatient Hospital Stay: Payer: 59 | Attending: Hematology | Admitting: Hematology

## 2022-04-13 VITALS — BP 148/81 | HR 75 | Temp 97.3°F | Resp 18 | Wt 226.5 lb

## 2022-04-13 DIAGNOSIS — D461 Refractory anemia with ring sideroblasts: Secondary | ICD-10-CM | POA: Insufficient documentation

## 2022-04-13 DIAGNOSIS — Z882 Allergy status to sulfonamides status: Secondary | ICD-10-CM | POA: Insufficient documentation

## 2022-04-13 DIAGNOSIS — D469 Myelodysplastic syndrome, unspecified: Secondary | ICD-10-CM

## 2022-04-13 DIAGNOSIS — I10 Essential (primary) hypertension: Secondary | ICD-10-CM | POA: Insufficient documentation

## 2022-04-13 DIAGNOSIS — D649 Anemia, unspecified: Secondary | ICD-10-CM

## 2022-04-13 DIAGNOSIS — D75839 Thrombocytosis, unspecified: Secondary | ICD-10-CM | POA: Diagnosis not present

## 2022-04-13 DIAGNOSIS — R7989 Other specified abnormal findings of blood chemistry: Secondary | ICD-10-CM | POA: Insufficient documentation

## 2022-04-13 DIAGNOSIS — Z800 Family history of malignant neoplasm of digestive organs: Secondary | ICD-10-CM | POA: Insufficient documentation

## 2022-04-13 DIAGNOSIS — Z79899 Other long term (current) drug therapy: Secondary | ICD-10-CM | POA: Diagnosis not present

## 2022-04-13 DIAGNOSIS — C9200 Acute myeloblastic leukemia, not having achieved remission: Secondary | ICD-10-CM | POA: Insufficient documentation

## 2022-04-13 DIAGNOSIS — Z803 Family history of malignant neoplasm of breast: Secondary | ICD-10-CM | POA: Insufficient documentation

## 2022-04-13 LAB — CBC
HCT: 26.7 % — ABNORMAL LOW (ref 39.0–52.0)
Hemoglobin: 8.8 g/dL — ABNORMAL LOW (ref 13.0–17.0)
MCH: 32.2 pg (ref 26.0–34.0)
MCHC: 33 g/dL (ref 30.0–36.0)
MCV: 97.8 fL (ref 80.0–100.0)
Platelets: 537 10*3/uL — ABNORMAL HIGH (ref 150–400)
RBC: 2.73 MIL/uL — ABNORMAL LOW (ref 4.22–5.81)
RDW: 29.2 % — ABNORMAL HIGH (ref 11.5–15.5)
WBC: 4 10*3/uL (ref 4.0–10.5)
nRBC: 0.5 % — ABNORMAL HIGH (ref 0.0–0.2)

## 2022-04-13 LAB — SAMPLE TO BLOOD BANK

## 2022-04-13 MED ORDER — EPOETIN ALFA 20000 UNIT/ML IJ SOLN
20000.0000 [IU] | Freq: Once | INTRAMUSCULAR | Status: AC
  Administered 2022-04-13: 20000 [IU] via SUBCUTANEOUS
  Filled 2022-04-13: qty 1

## 2022-04-13 MED ORDER — EPOETIN ALFA 40000 UNIT/ML IJ SOLN
40000.0000 [IU] | Freq: Once | INTRAMUSCULAR | Status: AC
  Administered 2022-04-13: 40000 [IU] via SUBCUTANEOUS
  Filled 2022-04-13: qty 1

## 2022-04-13 NOTE — Progress Notes (Signed)
Patient presents today for Retacrit injection. Hemoglobin reviewed prior to administration. VSS. Injection tolerated without incident or complaint. See MAR for details. Patient stable during and after injection.  Patient discharged in satisfactory condition with no s/s of distress noted.    

## 2022-04-13 NOTE — Patient Instructions (Signed)
[facility]  Discharge Instructions: Thank you for choosing [facility] to provide your oncology and hematology care.  If you have a lab appointment with the Cancer Center, please come in thru the Main Entrance and check in at the main information desk.  Wear comfortable clothing and clothing appropriate for easy access to any Portacath or PICC line.   We strive to give you quality time with your provider. You may need to reschedule your appointment if you arrive late (15 or more minutes).  Arriving late affects you and other patients whose appointments are after yours.  Also, if you miss three or more appointments without notifying the office, you may be dismissed from the clinic at the provider's discretion.      For prescription refill requests, have your pharmacy contact our office and allow 72 hours for refills to be completed.    Epoetin Alfa Injection What is this medication? EPOETIN ALFA (e POE e tin AL fa) treats low levels of red blood cells (anemia) caused by kidney disease, chemotherapy, or HIV medications. It can also be used in people who are at risk for blood loss during surgery. It works by helping your body make more red blood cells, which reduces the need for blood transfusions. This medicine may be used for other purposes; ask your health care provider or pharmacist if you have questions. COMMON BRAND NAME(S): Epogen, Procrit, Retacrit What should I tell my care team before I take this medication? They need to know if you have any of these conditions: Blood clots Cancer Heart disease High blood pressure On dialysis Seizures Stroke An unusual or allergic reaction to epoetin alfa, albumin, benzyl alcohol, other medications, foods, dyes, or preservatives Pregnant or trying to get pregnant Breast-feeding How should I use this medication? This medication is injected into a vein or under the skin. It is usually given by your care team in a  hospital or clinic setting. It may also be given at home. If you get this medication at home, you will be taught how to prepare and give it. Use exactly as directed. Take it as directed on the prescription label at the same time every day. Keep taking it unless your care team tells you to stop. It is important that you put your used needles and syringes in a special sharps container. Do not put them in a trash can. If you do not have a sharps container, call your pharmacist or care team to get one. A special MedGuide will be given to you by the pharmacist with each prescription and refill. Be sure to read this information carefully each time. Talk to your care team about the use of this medication in children. While this medication may be used in children as young as 1 month of age for selected conditions, precautions do apply. Overdosage: If you think you have taken too much of this medicine contact a poison control center or emergency room at once. NOTE: This medicine is only for you. Do not share this medicine with others. What if I miss a dose? If you miss a dose, take it as soon as you can. If it is almost time for your next dose, take only that dose. Do not take double or extra doses. What may interact with this medication? Darbepoetin alfa Methoxy polyethylene glycol-epoetin beta This list may not describe all possible interactions. Give your health care provider a list of all the medicines, herbs, non-prescription drugs, or dietary   supplements you use. Also tell them if you smoke, drink alcohol, or use illegal drugs. Some items may interact with your medicine. What should I watch for while using this medication? Visit your care team for regular checks on your progress. Check your blood pressure as directed. Know what your blood pressure should be and when to contact your care team. Your condition will be monitored carefully while you are receiving this medication. You may need blood work while  taking this medication. What side effects may I notice from receiving this medication? Side effects that you should report to your care team as soon as possible: Allergic reactions--skin rash, itching, hives, swelling of the face, lips, tongue, or throat Blood clot--pain, swelling, or warmth in the leg, shortness of breath, chest pain Heart attack--pain or tightness in the chest, shoulders, arms, or jaw, nausea, shortness of breath, cold or clammy skin, feeling faint or lightheaded Increase in blood pressure Rash, fever, and swollen lymph nodes Redness, blistering, peeling, or loosening of the skin, including inside the mouth Seizures Stroke--sudden numbness or weakness of the face, arm, or leg, trouble speaking, confusion, trouble walking, loss of balance or coordination, dizziness, severe headache, change in vision Side effects that usually do not require medical attention (report to your care team if they continue or are bothersome): Bone, joint, or muscle pain Cough Headache Nausea Pain, redness, or irritation at injection site This list may not describe all possible side effects. Call your doctor for medical advice about side effects. You may report side effects to FDA at 1-800-FDA-1088. Where should I keep my medication? Keep out of the reach of children and pets. Store in a refrigerator. Do not freeze. Do not shake. Protect from light. Keep this medication in the original container until you are ready to take it. See product for storage information. Get rid of any unused medication after the expiration date. To get rid of medications that are no longer needed or have expired: Take the medication to a medication take-back program. Check with your pharmacy or law enforcement to find a location. If you cannot return the medication, ask your pharmacist or care team how to get rid of the medication safely. NOTE: This sheet is a summary. It may not cover all possible information. If you have  questions about this medicine, talk to your doctor, pharmacist, or health care provider.  2023 Elsevier/Gold Standard (2021-11-05 00:00:00)    To help prevent nausea and vomiting after your treatment, we encourage you to take your nausea medication as directed.  BELOW ARE SYMPTOMS THAT SHOULD BE REPORTED IMMEDIATELY: *FEVER GREATER THAN 100.4 F (38 C) OR HIGHER *CHILLS OR SWEATING *NAUSEA AND VOMITING THAT IS NOT CONTROLLED WITH YOUR NAUSEA MEDICATION *UNUSUAL SHORTNESS OF BREATH *UNUSUAL BRUISING OR BLEEDING *URINARY PROBLEMS (pain or burning when urinating, or frequent urination) *BOWEL PROBLEMS (unusual diarrhea, constipation, pain near the anus) TENDERNESS IN MOUTH AND THROAT WITH OR WITHOUT PRESENCE OF ULCERS (sore throat, sores in mouth, or a toothache) UNUSUAL RASH, SWELLING OR PAIN  UNUSUAL VAGINAL DISCHARGE OR ITCHING   Items with * indicate a potential emergency and should be followed up as soon as possible or go to the Emergency Department if any problems should occur.  Please show the CHEMOTHERAPY ALERT CARD or IMMUNOTHERAPY ALERT CARD at check-in to the Emergency Department and triage nurse.  Should you have questions after your visit or need to cancel or reschedule your appointment, please contact [facility] [phone]  and follow the  prompts.  Office hours are 8:00 a.m. to 4:30 p.m. Monday - Friday. Please note that voicemails left after 4:00 p.m. may not be returned until the following business day.  We are closed weekends and major holidays. You have access to a nurse at all times for urgent questions. Please call the main number to the clinic [phone] and follow the prompts.  For any non-urgent questions, you may also contact your provider using MyChart. We now offer e-Visits for anyone 53 and older to request care online for non-urgent symptoms. For details visit [email or web address].   Also download the MyChart app! Go to the app store,  search "MyChart", open the app, select [facility], and log in with your MyChart username and password.  Masks are optional in the cancer centers. If you would like for your care team to wear a mask while they are taking care of you, please let them know. You may have one support person who is at least 63 years old accompany you for your appointments.

## 2022-04-13 NOTE — Progress Notes (Signed)
[facility] [place], [place] [address]   CLINIC:  Medical Oncology/Hematology  PCP:  Stephen Watts, [place] [address] Dr. 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, a 61 y.o. 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  Last blood transfusion was on 03/16/2022.  Denies any bleeding per rectum or melena.  Denies any chest pains or lightheadedness.  Energy levels are low.  However he is able to do his day-to-day activities and full-time job.  REVIEW OF SYSTEMS:  Review of Systems  Constitutional:  Negative for appetite change and fatigue.  Cardiovascular:  Negative for chest pain.  Gastrointestinal:  Negative for blood in stool.  Genitourinary:  Negative for frequency and hematuria.   Neurological:  Negative for light-headedness.  Psychiatric/Behavioral:  Negative for sleep disturbance.   All other systems reviewed and are negative.   PAST MEDICAL/SURGICAL HISTORY:  History reviewed. No pertinent past medical history. History reviewed. No pertinent surgical history.  SOCIAL HISTORY:  Social History   Socioeconomic History   Marital status: Married    Spouse name: Not on file   Number of children: Not on file   Years of education: Not on file   Highest education level: Not on file  Occupational History   Not on file  Tobacco Use   Smoking status: Not on file   Smokeless tobacco: Not on file  Substance and Sexual Activity   Alcohol use: Not on file   Drug use: Not on file   Sexual activity: Not on file  Other Topics Concern   Not on file  Social History Narrative   Not on file   Social Determinants of Health   Financial Resource Strain: Not on file  Food Insecurity: Not on file  Transportation Needs: Not on file  Physical Activity: Not on file  Stress: Not on file   Social Connections: Not on file  Intimate Partner Violence: Not on file    FAMILY HISTORY:  History reviewed. No pertinent family history.  CURRENT MEDICATIONS:  Current Outpatient Medications  Medication Sig Dispense Refill   amLODipine (NORVASC) 5 MG tablet Take 5 mg by mouth daily.     lisinopril (ZESTRIL) 40 MG tablet Take 40 mg by mouth daily.     lisinopril-hydrochlorothiazide (ZESTORETIC) 20-25 MG tablet Take 1 tablet by mouth daily.     metFORMIN (GLUCOPHAGE) 1000 MG tablet Take 1,000 mg by mouth 2 (two) times daily.     metoprolol succinate (TOPROL-XL) 50 MG 24 hr tablet Take 50 mg by mouth daily.     sildenafil (VIAGRA) 50 MG tablet Take 50 mg by mouth daily as needed.     No current facility-administered medications for this visit.   Facility-Administered Medications Ordered in Other Visits  Medication Dose Route Frequency Provider Last Rate Last Admin   acetaminophen (TYLENOL) 325 MG tablet            loratadine (CLARITIN) 10 MG tablet             ALLERGIES:  Allergies  Allergen Reactions   Ibuprofen Hives   Sulfa Antibiotics Rash    PHYSICAL EXAM:  Performance status (ECOG): 0 - Asymptomatic  Vitals:   04/13/22 1115  BP: (!) 148/81  Pulse: 75  Resp: 18  Temp: (!) 97.3 F (36.3 C)  SpO2: 97%  Wt Readings from Last 3 Encounters:  04/13/22 226 lb 8 oz (102.7 kg)  03/08/22 229 lb 12.8 oz (104.2 kg)  03/01/22 229 lb 12.8 oz (104.2 kg)   Physical Exam Vitals reviewed.  Constitutional:      Appearance: Normal appearance. He is obese.  Cardiovascular:     Rate and Rhythm: Normal rate and regular rhythm.     Pulses: Normal pulses.     Heart sounds: Normal heart sounds.  Pulmonary:     Effort: Pulmonary effort is normal.     Breath sounds: Normal breath sounds.  Neurological:     General: No focal deficit present.     Mental Status: He is alert and oriented to person, place, and time.  Psychiatric:        Mood and Affect: Mood normal.         Behavior: Behavior normal.     LABORATORY DATA:  I have reviewed the labs as listed.     Latest Ref Rng & Units 04/13/2022   10:36 AM 04/05/2022    7:56 AM 03/29/2022    9:57 AM  CBC  WBC 4.0 - 10.5 K/uL 4.0  4.8  5.5   Hemoglobin 13.0 - 17.0 g/dL 8.8  8.8  8.1   Hematocrit 39.0 - 52.0 % 26.7  26.8  24.5   Platelets 150 - 400 K/uL 537  511  530       Latest Ref Rng & Units 09/11/2021   12:49 PM  CMP  Glucose 70 - 99 mg/dL 175   BUN 8 - 23 mg/dL 15   Creatinine 0.61 - 1.24 mg/dL 1.00   Sodium 135 - 145 mmol/L 137   Potassium 3.5 - 5.1 mmol/L 3.7   Chloride 98 - 111 mmol/L 103   CO2 22 - 32 mmol/L 25   Calcium 8.9 - 10.3 mg/dL 9.6   Total Protein 6.5 - 8.1 g/dL 8.2   Total Bilirubin 0.3 - 1.2 mg/dL 1.0   Alkaline Phos 38 - 126 U/L 53   AST 15 - 41 U/L 23   ALT 0 - 44 U/L 22       Component Value Date/Time   RBC 2.73 (L) 04/13/2022 1036   MCV 97.8 04/13/2022 1036   MCH 32.2 04/13/2022 1036   MCHC 33.0 04/13/2022 1036   RDW 29.2 (H) 04/13/2022 1036   LYMPHSABS 1.9 04/05/2022 0756   MONOABS 0.6 04/05/2022 0756   EOSABS 0.2 04/05/2022 0756   BASOSABS 0.0 04/05/2022 0756    DIAGNOSTIC IMAGING:  I have independently reviewed the scans and discussed with the patient. No results found.   ASSESSMENT:  MDS with ringed sideroblasts and thrombocytosis: - Patient seen for normocytic anemia with CBC on 05/08/2021 showing hemoglobin 8.4, MCV 97 and platelet count 631. - Ferritin was 1454, percent saturation was 85.  Hemochromatosis was negative. - He is taking iron tablet 2 times daily for the last 1 year. - Colonoscopy was reportedly done in [place] 3 years ago, within normal limits. - BMBX (10/08/2021): Hypercellular marrow with erythroid hyperplasia, erythroid dysplasia and ring sideroblasts.  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 - IPSS M: Score is  -0.73 (low risk).  IPSS-R score 2.5.  Median leukemia free survival was 5.9%.  Median overall survival was 6 years.  AML transformation rate was 1.7 %/year. - Ring sideroblasts were more than 15%.  JAK2 V617F  and reflex testing and BCR/ABL by FISH were negative for thrombocytosis. - Serum EPO was 107. - Retacrit 30,000 units weekly started on 11/26/2021, dose changed to 40,000 units every 2 weeks on 12/31/2021, dose increased to 60,000 units weekly on 03/01/2022.    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 - Denies any smoking history. - Paternal aunt had pancreatic cancer.  Sister had breast cancer.     PLAN:  MDS with ring sideroblasts and thrombocytosis: - Retacrit dose increased to 60,000 units on 03/01/2022 weekly. - Last PRBC was on 03/16/2022 of 1 unit. - Hemoglobin today is 8.8, same as last week.  Ferritin is 1200 and percent saturation 69. - Recommend continuing Retacrit 60,000 units weekly.  Reevaluate in 8 weeks. - If there is loss of response, consider Luspatercept.  2.  Elevated ferritin (hemochromatosis negative): - Ferritin is increased to 1200, likely from transfusion.  Percent saturation is 69. - We will closely monitor.  3.  Hypertension: - Continue lisinopril, HCTZ and Norvasc.  Blood pressure today is 148/81.  Orders placed this encounter:  No orders of the defined types were placed in this encounter.    Derek Jack, MD [facility] [phone]

## 2022-04-20 ENCOUNTER — Inpatient Hospital Stay: Payer: 59

## 2022-04-20 VITALS — BP 149/79 | HR 76 | Temp 96.4°F | Resp 18

## 2022-04-20 DIAGNOSIS — C9200 Acute myeloblastic leukemia, not having achieved remission: Secondary | ICD-10-CM | POA: Insufficient documentation

## 2022-04-20 DIAGNOSIS — D649 Anemia, unspecified: Secondary | ICD-10-CM

## 2022-04-20 DIAGNOSIS — Z803 Family history of malignant neoplasm of breast: Secondary | ICD-10-CM | POA: Diagnosis not present

## 2022-04-20 DIAGNOSIS — Z800 Family history of malignant neoplasm of digestive organs: Secondary | ICD-10-CM | POA: Insufficient documentation

## 2022-04-20 DIAGNOSIS — I10 Essential (primary) hypertension: Secondary | ICD-10-CM | POA: Diagnosis not present

## 2022-04-20 DIAGNOSIS — Z79899 Other long term (current) drug therapy: Secondary | ICD-10-CM | POA: Diagnosis not present

## 2022-04-20 DIAGNOSIS — Z882 Allergy status to sulfonamides status: Secondary | ICD-10-CM | POA: Diagnosis not present

## 2022-04-20 DIAGNOSIS — D461 Refractory anemia with ring sideroblasts: Secondary | ICD-10-CM | POA: Diagnosis not present

## 2022-04-20 DIAGNOSIS — D75839 Thrombocytosis, unspecified: Secondary | ICD-10-CM | POA: Diagnosis not present

## 2022-04-20 DIAGNOSIS — D469 Myelodysplastic syndrome, unspecified: Secondary | ICD-10-CM

## 2022-04-20 LAB — CBC
HCT: 22.7 % — ABNORMAL LOW (ref 39.0–52.0)
Hemoglobin: 7.6 g/dL — ABNORMAL LOW (ref 13.0–17.0)
MCH: 32.2 pg (ref 26.0–34.0)
MCHC: 33.5 g/dL (ref 30.0–36.0)
MCV: 96.2 fL (ref 80.0–100.0)
Platelets: 581 10*3/uL — ABNORMAL HIGH (ref 150–400)
RBC: 2.36 MIL/uL — ABNORMAL LOW (ref 4.22–5.81)
RDW: 29.4 % — ABNORMAL HIGH (ref 11.5–15.5)
WBC: 4.2 10*3/uL (ref 4.0–10.5)
nRBC: 0 % (ref 0.0–0.2)

## 2022-04-20 LAB — SAMPLE TO BLOOD BANK

## 2022-04-20 MED ORDER — EPOETIN ALFA 40000 UNIT/ML IJ SOLN
40000.0000 [IU] | Freq: Once | INTRAMUSCULAR | Status: AC
  Administered 2022-04-20: 40000 [IU] via SUBCUTANEOUS
  Filled 2022-04-20: qty 1

## 2022-04-20 MED ORDER — EPOETIN ALFA 20000 UNIT/ML IJ SOLN
20000.0000 [IU] | Freq: Once | INTRAMUSCULAR | Status: AC
  Administered 2022-04-20: 20000 [IU] via SUBCUTANEOUS
  Filled 2022-04-20: qty 1

## 2022-04-20 NOTE — Patient Instructions (Signed)
[facility]  Discharge Instructions: Thank you for choosing [facility] to provide your oncology and hematology care.  If you have a lab appointment with the [facility], please come in thru the Main Entrance and check in at the main information desk.  Wear comfortable clothing and clothing appropriate for easy access to any Portacath or PICC line.   We strive to give you quality time with your provider. You may need to reschedule your appointment if you arrive late (15 or more minutes).  Arriving late affects you and other patients whose appointments are after yours.  Also, if you miss three or more appointments without notifying the office, you may be dismissed from the clinic at the provider's discretion.      For prescription refill requests, have your pharmacy contact our office and allow 72 hours for refills to be completed.    Today you received the following chemotherapy and/or immunotherapy agents Procrit      To help prevent nausea and vomiting after your treatment, we encourage you to take your nausea medication as directed.  BELOW ARE SYMPTOMS THAT SHOULD BE REPORTED IMMEDIATELY: *FEVER GREATER THAN 100.4 F (38 C) OR HIGHER *CHILLS OR SWEATING *NAUSEA AND VOMITING THAT IS NOT CONTROLLED WITH YOUR NAUSEA MEDICATION *UNUSUAL SHORTNESS OF BREATH *UNUSUAL BRUISING OR BLEEDING *URINARY PROBLEMS (pain or burning when urinating, or frequent urination) *BOWEL PROBLEMS (unusual diarrhea, constipation, pain near the anus) TENDERNESS IN MOUTH AND THROAT WITH OR WITHOUT PRESENCE OF ULCERS (sore throat, sores in mouth, or a toothache) UNUSUAL RASH, SWELLING OR PAIN  UNUSUAL VAGINAL DISCHARGE OR ITCHING   Items with * indicate a potential emergency and should be followed up as soon as possible or go to the Emergency Department if any problems should occur.  Please show the CHEMOTHERAPY ALERT CARD or IMMUNOTHERAPY ALERT CARD at check-in to the Emergency  Department and triage nurse.  Should you have questions after your visit or need to cancel or reschedule your appointment, please contact [facility] [phone]  and follow the prompts.  Office hours are 8:00 a.m. to 4:30 p.m. Monday - Friday. Please note that voicemails left after 4:00 p.m. may not be returned until the following business day.  We are closed weekends and major holidays. You have access to a nurse at all times for urgent questions. Please call the main number to the clinic [phone] and follow the prompts.  For any non-urgent questions, you may also contact your provider using MyChart. We now offer e-Visits for anyone 63 and older to request care online for non-urgent symptoms. For details visit [email or web address].   Also download the MyChart app! Go to the app store, search "MyChart", open the app, select [facility], and log in with your MyChart username and password.  Masks are optional in the cancer centers. If you would like for your care team to wear a mask while they are taking care of you, please let them know. You may have one support person who is at least 63 years old accompany you for your appointments.

## 2022-04-20 NOTE — Progress Notes (Signed)
Patient presents today for Procrit injection per providers order.  Hgb noted to be 7.6.  Patient has no new complaints at this time. Stable during administration without incident; injection site WNL; see MAR for injection details.  Patient tolerated procedure well and without incident.  No questions or complaints noted at this time.

## 2022-04-27 ENCOUNTER — Inpatient Hospital Stay: Payer: 59

## 2022-04-27 VITALS — BP 139/66 | HR 73 | Temp 97.3°F | Resp 18

## 2022-04-27 DIAGNOSIS — D75839 Thrombocytosis, unspecified: Secondary | ICD-10-CM | POA: Diagnosis not present

## 2022-04-27 DIAGNOSIS — I10 Essential (primary) hypertension: Secondary | ICD-10-CM | POA: Diagnosis not present

## 2022-04-27 DIAGNOSIS — C9200 Acute myeloblastic leukemia, not having achieved remission: Secondary | ICD-10-CM | POA: Diagnosis not present

## 2022-04-27 DIAGNOSIS — D469 Myelodysplastic syndrome, unspecified: Secondary | ICD-10-CM

## 2022-04-27 DIAGNOSIS — Z803 Family history of malignant neoplasm of breast: Secondary | ICD-10-CM | POA: Diagnosis not present

## 2022-04-27 DIAGNOSIS — D461 Refractory anemia with ring sideroblasts: Secondary | ICD-10-CM | POA: Diagnosis not present

## 2022-04-27 DIAGNOSIS — Z882 Allergy status to sulfonamides status: Secondary | ICD-10-CM | POA: Diagnosis not present

## 2022-04-27 DIAGNOSIS — Z79899 Other long term (current) drug therapy: Secondary | ICD-10-CM | POA: Diagnosis not present

## 2022-04-27 DIAGNOSIS — D649 Anemia, unspecified: Secondary | ICD-10-CM

## 2022-04-27 DIAGNOSIS — Z800 Family history of malignant neoplasm of digestive organs: Secondary | ICD-10-CM | POA: Diagnosis not present

## 2022-04-27 LAB — CBC
HCT: 24.4 % — ABNORMAL LOW (ref 39.0–52.0)
Hemoglobin: 8.1 g/dL — ABNORMAL LOW (ref 13.0–17.0)
MCH: 32.1 pg (ref 26.0–34.0)
MCHC: 33.2 g/dL (ref 30.0–36.0)
MCV: 96.8 fL (ref 80.0–100.0)
Platelets: 558 10*3/uL — ABNORMAL HIGH (ref 150–400)
RBC: 2.52 MIL/uL — ABNORMAL LOW (ref 4.22–5.81)
RDW: 29.2 % — ABNORMAL HIGH (ref 11.5–15.5)
WBC: 5.6 10*3/uL (ref 4.0–10.5)
nRBC: 2.3 % — ABNORMAL HIGH (ref 0.0–0.2)

## 2022-04-27 LAB — SAMPLE TO BLOOD BANK

## 2022-04-27 MED ORDER — EPOETIN ALFA 20000 UNIT/ML IJ SOLN
20000.0000 [IU] | Freq: Once | INTRAMUSCULAR | Status: AC
  Administered 2022-04-27: 20000 [IU] via SUBCUTANEOUS
  Filled 2022-04-27: qty 1

## 2022-04-27 MED ORDER — EPOETIN ALFA 40000 UNIT/ML IJ SOLN
40000.0000 [IU] | Freq: Once | INTRAMUSCULAR | Status: AC
  Administered 2022-04-27: 40000 [IU] via SUBCUTANEOUS
  Filled 2022-04-27: qty 1

## 2022-04-27 NOTE — Progress Notes (Signed)
Stephen Watts presents today for injection per the provider's orders.  Epogen administration without incident; injection site WNL; see MAR for injection details.  Patient tolerated procedure well and without incident.  No questions or complaints noted at this time. No complaints at this time. Discharged from clinic ambulatory in stable condition. Alert and oriented x 3. F/U with [facility] as scheduled.

## 2022-04-27 NOTE — Patient Instructions (Signed)
[facility]  Discharge Instructions: Thank you for choosing [facility] to provide your oncology and hematology care.  If you have a lab appointment with the [facility], please come in thru the Main Entrance and check in at the main information desk.  Wear comfortable clothing and clothing appropriate for easy access to any Portacath or PICC line.   We strive to give you quality time with your provider. You may need to reschedule your appointment if you arrive late (15 or more minutes).  Arriving late affects you and other patients whose appointments are after yours.  Also, if you miss three or more appointments without notifying the office, you may be dismissed from the clinic at the provider's discretion.      For prescription refill requests, have your pharmacy contact our office and allow 72 hours for refills to be completed.    Today you received the following chemotherapy and/or immunotherapy agents Epogen injection.  Epoetin Alfa Injection What is this medication? EPOETIN ALFA (e POE e tin AL fa) treats low levels of red blood cells (anemia) caused by kidney disease, chemotherapy, or HIV medications. It can also be used in people who are at risk for blood loss during surgery. It works by Building control surveyor make more red blood cells, which reduces the need for blood transfusions. This medicine may be used for other purposes; ask your health care provider or pharmacist if you have questions. COMMON BRAND NAME(S): Epogen, Procrit, Retacrit What should I tell my care team before I take this medication? They need to know if you have any of these conditions: Blood clots Cancer Heart disease High blood pressure On dialysis Seizures Stroke An unusual or allergic reaction to epoetin alfa, albumin, benzyl alcohol, other medications, foods, dyes, or preservatives Pregnant or trying to get pregnant Breast-feeding How should I use this medication? This  medication is injected into a vein or under the skin. It is usually given by your care team in a hospital or clinic setting. It may also be given at home. If you get this medication at home, you will be taught how to prepare and give it. Use exactly as directed. Take it as directed on the prescription label at the same time every day. Keep taking it unless your care team tells you to stop. It is important that you put your used needles and syringes in a special sharps container. Do not put them in a trash can. If you do not have a sharps container, call your pharmacist or care team to get one. A special MedGuide will be given to you by the pharmacist with each prescription and refill. Be sure to read this information carefully each time. Talk to your care team about the use of this medication in children. While this medication may be used in children as young as 67 month of age for selected conditions, precautions do apply. Overdosage: If you think you have taken too much of this medicine contact a poison control center or emergency room at once. NOTE: This medicine is only for you. Do not share this medicine with others. What if I miss a dose? If you miss a dose, take it as soon as you can. If it is almost time for your next dose, take only that dose. Do not take double or extra doses. What may interact with this medication? Darbepoetin alfa Methoxy polyethylene glycol-epoetin beta This list may not describe all possible interactions. Give your health care  provider a list of all the medicines, herbs, non-prescription drugs, or dietary supplements you use. Also tell them if you smoke, drink alcohol, or use illegal drugs. Some items may interact with your medicine. What should I watch for while using this medication? Visit your care team for regular checks on your progress. Check your blood pressure as directed. Know what your blood pressure should be and when to contact your care team. Your condition  will be monitored carefully while you are receiving this medication. You may need blood work while taking this medication. What side effects may I notice from receiving this medication? Side effects that you should report to your care team as soon as possible: Allergic reactions--skin rash, itching, hives, swelling of the face, lips, tongue, or throat Blood clot--pain, swelling, or warmth in the leg, shortness of breath, chest pain Heart attack--pain or tightness in the chest, shoulders, arms, or jaw, nausea, shortness of breath, cold or clammy skin, feeling faint or lightheaded Increase in blood pressure Rash, fever, and swollen lymph nodes Redness, blistering, peeling, or loosening of the skin, including inside the mouth Seizures Stroke--sudden numbness or weakness of the face, arm, or leg, trouble speaking, confusion, trouble walking, loss of balance or coordination, dizziness, severe headache, change in vision Side effects that usually do not require medical attention (report to your care team if they continue or are bothersome): Bone, joint, or muscle pain Cough Headache Nausea Pain, redness, or irritation at injection site This list may not describe all possible side effects. Call your doctor for medical advice about side effects. You may report side effects to FDA at 1-800-FDA-1088. Where should I keep my medication? Keep out of the reach of children and pets. Store in a refrigerator. Do not freeze. Do not shake. Protect from light. Keep this medication in the original container until you are ready to take it. See product for storage information. Get rid of any unused medication after the expiration date. To get rid of medications that are no longer needed or have expired: Take the medication to a medication take-back program. Check with your pharmacy or law enforcement to find a location. If you cannot return the medication, ask your pharmacist or care team how to get rid of the  medication safely. NOTE: This sheet is a summary. It may not cover all possible information. If you have questions about this medicine, talk to your doctor, pharmacist, or health care provider.  2023 Elsevier/Gold Standard (2021-11-05 00:00:00)       To help prevent nausea and vomiting after your treatment, we encourage you to take your nausea medication as directed.  BELOW ARE SYMPTOMS THAT SHOULD BE REPORTED IMMEDIATELY: *FEVER GREATER THAN 100.4 F (38 C) OR HIGHER *CHILLS OR SWEATING *NAUSEA AND VOMITING THAT IS NOT CONTROLLED WITH YOUR NAUSEA MEDICATION *UNUSUAL SHORTNESS OF BREATH *UNUSUAL BRUISING OR BLEEDING *URINARY PROBLEMS (pain or burning when urinating, or frequent urination) *BOWEL PROBLEMS (unusual diarrhea, constipation, pain near the anus) TENDERNESS IN MOUTH AND THROAT WITH OR WITHOUT PRESENCE OF ULCERS (sore throat, sores in mouth, or a toothache) UNUSUAL RASH, SWELLING OR PAIN  UNUSUAL VAGINAL DISCHARGE OR ITCHING   Items with * indicate a potential emergency and should be followed up as soon as possible or go to the Emergency Department if any problems should occur.  Please show the CHEMOTHERAPY ALERT CARD or IMMUNOTHERAPY ALERT CARD at check-in to the Emergency Department and triage nurse.  Should you have questions after your visit or need to cancel or  reschedule your appointment, please contact [facility] [phone]  and follow the prompts.  Office hours are 8:00 a.m. to 4:30 p.m. Monday - Friday. Please note that voicemails left after 4:00 p.m. may not be returned until the following business day.  We are closed weekends and major holidays. You have access to a nurse at all times for urgent questions. Please call the main number to the clinic [phone] and follow the prompts.  For any non-urgent questions, you may also contact your provider using MyChart. We now offer e-Visits for anyone 48 and older to request care online for non-urgent  symptoms. For details visit [email or web address].   Also download the MyChart app! Go to the app store, search "MyChart", open the app, select [facility], and log in with your MyChart username and password.  Masks are optional in the cancer centers. If you would like for your care team to wear a mask while they are taking care of you, please let them know. You may have one support person who is at least 63 years old accompany you for your appointments.

## 2022-05-04 ENCOUNTER — Inpatient Hospital Stay: Payer: 59

## 2022-05-04 VITALS — BP 166/72 | HR 88 | Temp 97.6°F | Resp 18

## 2022-05-04 DIAGNOSIS — Z79899 Other long term (current) drug therapy: Secondary | ICD-10-CM | POA: Diagnosis not present

## 2022-05-04 DIAGNOSIS — C9200 Acute myeloblastic leukemia, not having achieved remission: Secondary | ICD-10-CM | POA: Diagnosis not present

## 2022-05-04 DIAGNOSIS — Z803 Family history of malignant neoplasm of breast: Secondary | ICD-10-CM | POA: Diagnosis not present

## 2022-05-04 DIAGNOSIS — Z800 Family history of malignant neoplasm of digestive organs: Secondary | ICD-10-CM | POA: Diagnosis not present

## 2022-05-04 DIAGNOSIS — D469 Myelodysplastic syndrome, unspecified: Secondary | ICD-10-CM

## 2022-05-04 DIAGNOSIS — I10 Essential (primary) hypertension: Secondary | ICD-10-CM | POA: Diagnosis not present

## 2022-05-04 DIAGNOSIS — D75839 Thrombocytosis, unspecified: Secondary | ICD-10-CM | POA: Diagnosis not present

## 2022-05-04 DIAGNOSIS — D461 Refractory anemia with ring sideroblasts: Secondary | ICD-10-CM | POA: Diagnosis not present

## 2022-05-04 DIAGNOSIS — D649 Anemia, unspecified: Secondary | ICD-10-CM

## 2022-05-04 DIAGNOSIS — Z882 Allergy status to sulfonamides status: Secondary | ICD-10-CM | POA: Diagnosis not present

## 2022-05-04 LAB — SAMPLE TO BLOOD BANK

## 2022-05-04 LAB — CBC
HCT: 28.4 % — ABNORMAL LOW (ref 39.0–52.0)
Hemoglobin: 9.4 g/dL — ABNORMAL LOW (ref 13.0–17.0)
MCH: 33.1 pg (ref 26.0–34.0)
MCHC: 33.1 g/dL (ref 30.0–36.0)
MCV: 100 fL (ref 80.0–100.0)
Platelets: 499 10*3/uL — ABNORMAL HIGH (ref 150–400)
RBC: 2.84 MIL/uL — ABNORMAL LOW (ref 4.22–5.81)
RDW: 29.8 % — ABNORMAL HIGH (ref 11.5–15.5)
WBC: 4.3 10*3/uL (ref 4.0–10.5)
nRBC: 0.7 % — ABNORMAL HIGH (ref 0.0–0.2)

## 2022-05-04 MED ORDER — EPOETIN ALFA 20000 UNIT/ML IJ SOLN
20000.0000 [IU] | Freq: Once | INTRAMUSCULAR | Status: AC
  Administered 2022-05-04: 20000 [IU] via SUBCUTANEOUS
  Filled 2022-05-04: qty 1

## 2022-05-04 MED ORDER — EPOETIN ALFA 40000 UNIT/ML IJ SOLN
40000.0000 [IU] | Freq: Once | INTRAMUSCULAR | Status: AC
  Administered 2022-05-04: 40000 [IU] via SUBCUTANEOUS
  Filled 2022-05-04: qty 1

## 2022-05-04 NOTE — Patient Instructions (Signed)
[facility]  Discharge Instructions: Thank you for choosing [facility] to provide your oncology and hematology care.  If you have a lab appointment with the Cancer Center, please come in thru the Main Entrance and check in at the main information desk.  Wear comfortable clothing and clothing appropriate for easy access to any Portacath or PICC line.   We strive to give you quality time with your provider. You may need to reschedule your appointment if you arrive late (15 or more minutes).  Arriving late affects you and other patients whose appointments are after yours.  Also, if you miss three or more appointments without notifying the office, you may be dismissed from the clinic at the provider's discretion.      For prescription refill requests, have your pharmacy contact our office and allow 72 hours for refills to be completed.    Today you received the following chemotherapy and/or immunotherapy agents Epogen      To help prevent nausea and vomiting after your treatment, we encourage you to take your nausea medication as directed.  BELOW ARE SYMPTOMS THAT SHOULD BE REPORTED IMMEDIATELY: *FEVER GREATER THAN 100.4 F (38 C) OR HIGHER *CHILLS OR SWEATING *NAUSEA AND VOMITING THAT IS NOT CONTROLLED WITH YOUR NAUSEA MEDICATION *UNUSUAL SHORTNESS OF BREATH *UNUSUAL BRUISING OR BLEEDING *URINARY PROBLEMS (pain or burning when urinating, or frequent urination) *BOWEL PROBLEMS (unusual diarrhea, constipation, pain near the anus) TENDERNESS IN MOUTH AND THROAT WITH OR WITHOUT PRESENCE OF ULCERS (sore throat, sores in mouth, or a toothache) UNUSUAL RASH, SWELLING OR PAIN  UNUSUAL VAGINAL DISCHARGE OR ITCHING   Items with * indicate a potential emergency and should be followed up as soon as possible or go to the Emergency Department if any problems should occur.  Please show the CHEMOTHERAPY ALERT CARD or IMMUNOTHERAPY ALERT CARD at check-in to the Emergency  Department and triage nurse.  Should you have questions after your visit or need to cancel or reschedule your appointment, please contact [facility] [phone]  and follow the prompts.  Office hours are 8:00 a.m. to 4:30 p.m. Monday - Friday. Please note that voicemails left after 4:00 p.m. may not be returned until the following business day.  We are closed weekends and major holidays. You have access to a nurse at all times for urgent questions. Please call the main number to the clinic [phone] and follow the prompts.  For any non-urgent questions, you may also contact your provider using MyChart. We now offer e-Visits for anyone 18 and older to request care online for non-urgent symptoms. For details visit [email or web address].   Also download the MyChart app! Go to the app store, search "MyChart", open the app, select [facility], and log in with your MyChart username and password.  Masks are optional in the cancer centers. If you would like for your care team to wear a mask while they are taking care of you, please let them know. You may have one support person who is at least 63 years old accompany you for your appointments.  

## 2022-05-04 NOTE — Progress Notes (Signed)
Patient presents today for Epogen per providers order.  Vital signs WNL.  Hgb noted to be 9.4.  Patient has no new complaints at this time.  Stable during administration without incident; injection site WNL; see MAR for injection details.  Patient tolerated procedure well and without incident.  No questions or complaints noted at this time.

## 2022-05-11 ENCOUNTER — Inpatient Hospital Stay: Payer: 59 | Attending: Hematology

## 2022-05-11 ENCOUNTER — Inpatient Hospital Stay: Payer: 59

## 2022-05-11 VITALS — BP 136/61 | HR 87 | Temp 98.2°F | Resp 18

## 2022-05-11 DIAGNOSIS — D461 Refractory anemia with ring sideroblasts: Secondary | ICD-10-CM | POA: Diagnosis present

## 2022-05-11 DIAGNOSIS — I10 Essential (primary) hypertension: Secondary | ICD-10-CM | POA: Insufficient documentation

## 2022-05-11 DIAGNOSIS — D75839 Thrombocytosis, unspecified: Secondary | ICD-10-CM | POA: Diagnosis not present

## 2022-05-11 DIAGNOSIS — D469 Myelodysplastic syndrome, unspecified: Secondary | ICD-10-CM

## 2022-05-11 DIAGNOSIS — Z79899 Other long term (current) drug therapy: Secondary | ICD-10-CM | POA: Insufficient documentation

## 2022-05-11 DIAGNOSIS — D649 Anemia, unspecified: Secondary | ICD-10-CM

## 2022-05-11 DIAGNOSIS — R7989 Other specified abnormal findings of blood chemistry: Secondary | ICD-10-CM | POA: Insufficient documentation

## 2022-05-11 LAB — CBC
HCT: 26.2 % — ABNORMAL LOW (ref 39.0–52.0)
Hemoglobin: 8.7 g/dL — ABNORMAL LOW (ref 13.0–17.0)
MCH: 32.8 pg (ref 26.0–34.0)
MCHC: 33.2 g/dL (ref 30.0–36.0)
MCV: 98.9 fL (ref 80.0–100.0)
Platelets: 517 10*3/uL — ABNORMAL HIGH (ref 150–400)
RBC: 2.65 MIL/uL — ABNORMAL LOW (ref 4.22–5.81)
RDW: 29.2 % — ABNORMAL HIGH (ref 11.5–15.5)
WBC: 5 10*3/uL (ref 4.0–10.5)
nRBC: 0 % (ref 0.0–0.2)

## 2022-05-11 LAB — SAMPLE TO BLOOD BANK

## 2022-05-11 MED ORDER — EPOETIN ALFA 40000 UNIT/ML IJ SOLN
40000.0000 [IU] | Freq: Once | INTRAMUSCULAR | Status: AC
  Administered 2022-05-11: 40000 [IU] via SUBCUTANEOUS
  Filled 2022-05-11: qty 1

## 2022-05-11 MED ORDER — EPOETIN ALFA 20000 UNIT/ML IJ SOLN
20000.0000 [IU] | Freq: Once | INTRAMUSCULAR | Status: AC
  Administered 2022-05-11: 20000 [IU] via SUBCUTANEOUS
  Filled 2022-05-11: qty 1

## 2022-05-11 NOTE — Patient Instructions (Signed)
[facility]  Discharge Instructions: Thank you for choosing [facility] to provide your oncology and hematology care.  If you have a lab appointment with the Cancer Center, please come in thru the Main Entrance and check in at the main information desk.  Wear comfortable clothing and clothing appropriate for easy access to any Portacath or PICC line.   We strive to give you quality time with your provider. You may need to reschedule your appointment if you arrive late (15 or more minutes).  Arriving late affects you and other patients whose appointments are after yours.  Also, if you miss three or more appointments without notifying the office, you may be dismissed from the clinic at the provider's discretion.      For prescription refill requests, have your pharmacy contact our office and allow 72 hours for refills to be completed.    Today you received the following chemotherapy and/or immunotherapy agents Epogen      To help prevent nausea and vomiting after your treatment, we encourage you to take your nausea medication as directed.  BELOW ARE SYMPTOMS THAT SHOULD BE REPORTED IMMEDIATELY: *FEVER GREATER THAN 100.4 F (38 C) OR HIGHER *CHILLS OR SWEATING *NAUSEA AND VOMITING THAT IS NOT CONTROLLED WITH YOUR NAUSEA MEDICATION *UNUSUAL SHORTNESS OF BREATH *UNUSUAL BRUISING OR BLEEDING *URINARY PROBLEMS (pain or burning when urinating, or frequent urination) *BOWEL PROBLEMS (unusual diarrhea, constipation, pain near the anus) TENDERNESS IN MOUTH AND THROAT WITH OR WITHOUT PRESENCE OF ULCERS (sore throat, sores in mouth, or a toothache) UNUSUAL RASH, SWELLING OR PAIN  UNUSUAL VAGINAL DISCHARGE OR ITCHING   Items with * indicate a potential emergency and should be followed up as soon as possible or go to the Emergency Department if any problems should occur.  Please show the CHEMOTHERAPY ALERT CARD or IMMUNOTHERAPY ALERT CARD at check-in to the Emergency  Department and triage nurse.  Should you have questions after your visit or need to cancel or reschedule your appointment, please contact [facility] [phone]  and follow the prompts.  Office hours are 8:00 a.m. to 4:30 p.m. Monday - Friday. Please note that voicemails left after 4:00 p.m. may not be returned until the following business day.  We are closed weekends and major holidays. You have access to a nurse at all times for urgent questions. Please call the main number to the clinic [phone] and follow the prompts.  For any non-urgent questions, you may also contact your provider using MyChart. We now offer e-Visits for anyone 18 and older to request care online for non-urgent symptoms. For details visit [email or web address].   Also download the MyChart app! Go to the app store, search "MyChart", open the app, select [facility], and log in with your MyChart username and password.  Masks are optional in the cancer centers. If you would like for your care team to wear a mask while they are taking care of you, please let them know. You may have one support person who is at least 63 years old accompany you for your appointments.  

## 2022-05-11 NOTE — Progress Notes (Signed)
Patient presents today for Epogen injection per providers order.  Vital signs WNL.  Hgb noted to be 8.7.  Patient has no new complaints at this time.  Stable during administration without incident; injection site WNL; see MAR for injection details.  Patient tolerated procedure well and without incident.  No questions or complaints noted at this time.

## 2022-05-17 DIAGNOSIS — I10 Essential (primary) hypertension: Secondary | ICD-10-CM | POA: Diagnosis not present

## 2022-05-17 DIAGNOSIS — E1165 Type 2 diabetes mellitus with hyperglycemia: Secondary | ICD-10-CM | POA: Diagnosis not present

## 2022-05-17 DIAGNOSIS — D649 Anemia, unspecified: Secondary | ICD-10-CM | POA: Diagnosis not present

## 2022-05-17 DIAGNOSIS — Z1211 Encounter for screening for malignant neoplasm of colon: Secondary | ICD-10-CM | POA: Diagnosis not present

## 2022-05-17 DIAGNOSIS — Z6835 Body mass index (BMI) 35.0-35.9, adult: Secondary | ICD-10-CM | POA: Diagnosis not present

## 2022-05-17 DIAGNOSIS — Z0000 Encounter for general adult medical examination without abnormal findings: Secondary | ICD-10-CM | POA: Diagnosis not present

## 2022-05-18 ENCOUNTER — Inpatient Hospital Stay: Payer: 59

## 2022-05-18 VITALS — BP 143/83 | HR 89 | Temp 97.5°F | Resp 18 | Wt 228.2 lb

## 2022-05-18 DIAGNOSIS — D649 Anemia, unspecified: Secondary | ICD-10-CM

## 2022-05-18 DIAGNOSIS — D461 Refractory anemia with ring sideroblasts: Secondary | ICD-10-CM | POA: Diagnosis not present

## 2022-05-18 DIAGNOSIS — D469 Myelodysplastic syndrome, unspecified: Secondary | ICD-10-CM

## 2022-05-18 LAB — CBC
HCT: 23.7 % — ABNORMAL LOW (ref 39.0–52.0)
Hemoglobin: 8 g/dL — ABNORMAL LOW (ref 13.0–17.0)
MCH: 33.1 pg (ref 26.0–34.0)
MCHC: 33.8 g/dL (ref 30.0–36.0)
MCV: 97.9 fL (ref 80.0–100.0)
Platelets: 563 10*3/uL — ABNORMAL HIGH (ref 150–400)
RBC: 2.42 MIL/uL — ABNORMAL LOW (ref 4.22–5.81)
RDW: 28.2 % — ABNORMAL HIGH (ref 11.5–15.5)
WBC: 6.2 10*3/uL (ref 4.0–10.5)
nRBC: 0.6 % — ABNORMAL HIGH (ref 0.0–0.2)

## 2022-05-18 LAB — SAMPLE TO BLOOD BANK

## 2022-05-18 MED ORDER — EPOETIN ALFA 20000 UNIT/ML IJ SOLN
20000.0000 [IU] | Freq: Once | INTRAMUSCULAR | Status: AC
  Administered 2022-05-18: 20000 [IU] via SUBCUTANEOUS
  Filled 2022-05-18: qty 1

## 2022-05-18 MED ORDER — EPOETIN ALFA 40000 UNIT/ML IJ SOLN
40000.0000 [IU] | Freq: Once | INTRAMUSCULAR | Status: AC
  Administered 2022-05-18: 40000 [IU] via SUBCUTANEOUS
  Filled 2022-05-18: qty 1

## 2022-05-18 NOTE — Progress Notes (Signed)
Patient presents today for Epoetin injection. Hemoglobin reviewed prior to administration.  Dr. Delton Coombes made aware of drop in hemoglobin, advised to keep giving patient epoetin injection until he is reevaluated on 10/31, patient made aware. VSS tolerated without incident or complaint. See MAR for details. Patient stable during and after injection. Patient discharged in satisfactory condition with no s/s of distress noted.

## 2022-05-18 NOTE — Patient Instructions (Signed)
[facility]  Discharge Instructions: Thank you for choosing [facility] to provide your oncology and hematology care.  If you have a lab appointment with the [facility], please come in thru the Main Entrance and check in at the main information desk.  Wear comfortable clothing and clothing appropriate for easy access to any Portacath or PICC line.   We strive to give you quality time with your provider. You may need to reschedule your appointment if you arrive late (15 or more minutes).  Arriving late affects you and other patients whose appointments are after yours.  Also, if you miss three or more appointments without notifying the office, you may be dismissed from the clinic at the provider's discretion.      For prescription refill requests, have your pharmacy contact our office and allow 72 hours for refills to be completed.    Today you received the following Epoetin, return as scheduled.   To help prevent nausea and vomiting after your treatment, we encourage you to take your nausea medication as directed.  BELOW ARE SYMPTOMS THAT SHOULD BE REPORTED IMMEDIATELY: *FEVER GREATER THAN 100.4 F (38 C) OR HIGHER *CHILLS OR SWEATING *NAUSEA AND VOMITING THAT IS NOT CONTROLLED WITH YOUR NAUSEA MEDICATION *UNUSUAL SHORTNESS OF BREATH *UNUSUAL BRUISING OR BLEEDING *URINARY PROBLEMS (pain or burning when urinating, or frequent urination) *BOWEL PROBLEMS (unusual diarrhea, constipation, pain near the anus) TENDERNESS IN MOUTH AND THROAT WITH OR WITHOUT PRESENCE OF ULCERS (sore throat, sores in mouth, or a toothache) UNUSUAL RASH, SWELLING OR PAIN  UNUSUAL VAGINAL DISCHARGE OR ITCHING   Items with * indicate a potential emergency and should be followed up as soon as possible or go to the Emergency Department if any problems should occur.  Please show the CHEMOTHERAPY ALERT CARD or IMMUNOTHERAPY ALERT CARD at check-in to the Emergency Department and triage  nurse.  Should you have questions after your visit or need to cancel or reschedule your appointment, please contact [facility] [phone]  and follow the prompts.  Office hours are 8:00 a.m. to 4:30 p.m. Monday - Friday. Please note that voicemails left after 4:00 p.m. may not be returned until the following business day.  We are closed weekends and major holidays. You have access to a nurse at all times for urgent questions. Please call the main number to the clinic [phone] and follow the prompts.  For any non-urgent questions, you may also contact your provider using MyChart. We now offer e-Visits for anyone 73 and older to request care online for non-urgent symptoms. For details visit [email or web address].   Also download the MyChart app! Go to the app store, search "MyChart", open the app, select [facility], and log in with your MyChart username and password.  Masks are optional in the cancer centers. If you would like for your care team to wear a mask while they are taking care of you, please let them know. You may have one support person who is at least 63 years old accompany you for your appointments.

## 2022-05-25 ENCOUNTER — Inpatient Hospital Stay: Payer: 59

## 2022-05-25 VITALS — BP 148/74 | HR 84 | Temp 97.0°F | Resp 16

## 2022-05-25 DIAGNOSIS — D469 Myelodysplastic syndrome, unspecified: Secondary | ICD-10-CM

## 2022-05-25 DIAGNOSIS — D649 Anemia, unspecified: Secondary | ICD-10-CM

## 2022-05-25 DIAGNOSIS — D461 Refractory anemia with ring sideroblasts: Secondary | ICD-10-CM | POA: Diagnosis not present

## 2022-05-25 LAB — CBC
HCT: 29.1 % — ABNORMAL LOW (ref 39.0–52.0)
Hemoglobin: 9.5 g/dL — ABNORMAL LOW (ref 13.0–17.0)
MCH: 33 pg (ref 26.0–34.0)
MCHC: 32.6 g/dL (ref 30.0–36.0)
MCV: 101 fL — ABNORMAL HIGH (ref 80.0–100.0)
Platelets: 564 10*3/uL — ABNORMAL HIGH (ref 150–400)
RBC: 2.88 MIL/uL — ABNORMAL LOW (ref 4.22–5.81)
RDW: 30.1 % — ABNORMAL HIGH (ref 11.5–15.5)
WBC: 5.7 10*3/uL (ref 4.0–10.5)
nRBC: 1.4 % — ABNORMAL HIGH (ref 0.0–0.2)

## 2022-05-25 LAB — SAMPLE TO BLOOD BANK

## 2022-05-25 MED ORDER — EPOETIN ALFA 40000 UNIT/ML IJ SOLN
40000.0000 [IU] | Freq: Once | INTRAMUSCULAR | Status: AC
  Administered 2022-05-25: 40000 [IU] via SUBCUTANEOUS
  Filled 2022-05-25: qty 1

## 2022-05-25 MED ORDER — EPOETIN ALFA 20000 UNIT/ML IJ SOLN
20000.0000 [IU] | Freq: Once | INTRAMUSCULAR | Status: AC
  Administered 2022-05-25: 20000 [IU] via SUBCUTANEOUS
  Filled 2022-05-25: qty 1

## 2022-05-25 NOTE — Patient Instructions (Signed)
[facility]  Discharge Instructions: Thank you for choosing [facility] to provide your oncology and hematology care.  If you have a lab appointment with the [facility], please come in thru the Main Entrance and check in at the main information desk.  Wear comfortable clothing and clothing appropriate for easy access to any Portacath or PICC line.   We strive to give you quality time with your provider. You may need to reschedule your appointment if you arrive late (15 or more minutes).  Arriving late affects you and other patients whose appointments are after yours.  Also, if you miss three or more appointments without notifying the office, you may be dismissed from the clinic at the provider's discretion.      For prescription refill requests, have your pharmacy contact our office and allow 72 hours for refills to be completed.    Today you received the following epoetin, return as scheduled.   To help prevent nausea and vomiting after your treatment, we encourage you to take your nausea medication as directed.  BELOW ARE SYMPTOMS THAT SHOULD BE REPORTED IMMEDIATELY: *FEVER GREATER THAN 100.4 F (38 C) OR HIGHER *CHILLS OR SWEATING *NAUSEA AND VOMITING THAT IS NOT CONTROLLED WITH YOUR NAUSEA MEDICATION *UNUSUAL SHORTNESS OF BREATH *UNUSUAL BRUISING OR BLEEDING *URINARY PROBLEMS (pain or burning when urinating, or frequent urination) *BOWEL PROBLEMS (unusual diarrhea, constipation, pain near the anus) TENDERNESS IN MOUTH AND THROAT WITH OR WITHOUT PRESENCE OF ULCERS (sore throat, sores in mouth, or a toothache) UNUSUAL RASH, SWELLING OR PAIN  UNUSUAL VAGINAL DISCHARGE OR ITCHING   Items with * indicate a potential emergency and should be followed up as soon as possible or go to the Emergency Department if any problems should occur.  Please show the CHEMOTHERAPY ALERT CARD or IMMUNOTHERAPY ALERT CARD at check-in to the Emergency Department and triage  nurse.  Should you have questions after your visit or need to cancel or reschedule your appointment, please contact [facility] [phone]  and follow the prompts.  Office hours are 8:00 a.m. to 4:30 p.m. Monday - Friday. Please note that voicemails left after 4:00 p.m. may not be returned until the following business day.  We are closed weekends and major holidays. You have access to a nurse at all times for urgent questions. Please call the main number to the clinic [phone] and follow the prompts.  For any non-urgent questions, you may also contact your provider using MyChart. We now offer e-Visits for anyone 3 and older to request care online for non-urgent symptoms. For details visit [email or web address].   Also download the MyChart app! Go to the app store, search "MyChart", open the app, select [facility], and log in with your MyChart username and password.  Masks are optional in the cancer centers. If you would like for your care team to wear a mask while they are taking care of you, please let them know. You may have one support person who is at least 63 years old accompany you for your appointments.

## 2022-05-25 NOTE — Progress Notes (Signed)
RN received verbal call from lab that patient's hemoglobin is 9.5 today, pharmacy aware. Patient presents today for injection. Hemoglobin reviewed prior to administration. VSS tolerated without incident or complaint. See MAR for details. Patient stable during and after injection. Patient discharged in satisfactory condition with no s/s of distress noted.

## 2022-05-27 ENCOUNTER — Encounter: Payer: Self-pay | Admitting: *Deleted

## 2022-06-01 ENCOUNTER — Inpatient Hospital Stay: Payer: 59

## 2022-06-01 VITALS — BP 142/63 | HR 73 | Temp 97.3°F | Resp 18

## 2022-06-01 DIAGNOSIS — D469 Myelodysplastic syndrome, unspecified: Secondary | ICD-10-CM

## 2022-06-01 DIAGNOSIS — D461 Refractory anemia with ring sideroblasts: Secondary | ICD-10-CM | POA: Diagnosis not present

## 2022-06-01 DIAGNOSIS — D649 Anemia, unspecified: Secondary | ICD-10-CM

## 2022-06-01 LAB — SAMPLE TO BLOOD BANK

## 2022-06-01 MED ORDER — EPOETIN ALFA 20000 UNIT/ML IJ SOLN
20000.0000 [IU] | Freq: Once | INTRAMUSCULAR | Status: AC
  Administered 2022-06-01: 20000 [IU] via SUBCUTANEOUS
  Filled 2022-06-01: qty 1

## 2022-06-01 MED ORDER — EPOETIN ALFA 40000 UNIT/ML IJ SOLN
40000.0000 [IU] | Freq: Once | INTRAMUSCULAR | Status: AC
  Administered 2022-06-01: 40000 [IU] via SUBCUTANEOUS
  Filled 2022-06-01: qty 1

## 2022-06-01 NOTE — Progress Notes (Signed)
Stephen Watts presents today for injection per the provider's orders.  Epoetin 60,000 units administration without incident; injection site WNL; see MAR for injection details.  Patient tolerated procedure well and without incident.  No questions or complaints noted at this time. Pt's hemoglobin noted to be 9.4 today.  Discharged from clinic ambulatory in stable condition. Alert and oriented x 3. F/U with [facility] as scheduled.

## 2022-06-01 NOTE — Patient Instructions (Signed)
[facility]  Discharge Instructions: Thank you for choosing [facility] to provide your oncology and hematology care.  If you have a lab appointment with the [facility], please come in thru the Main Entrance and check in at the main information desk.  Wear comfortable clothing and clothing appropriate for easy access to any Portacath or PICC line.   We strive to give you quality time with your provider. You may need to reschedule your appointment if you arrive late (15 or more minutes).  Arriving late affects you and other patients whose appointments are after yours.  Also, if you miss three or more appointments without notifying the office, you may be dismissed from the clinic at the provider's discretion.      For prescription refill requests, have your pharmacy contact our office and allow 72 hours for refills to be completed.    Today you received Epoetin 60,000 units.     BELOW ARE SYMPTOMS THAT SHOULD BE REPORTED IMMEDIATELY: *FEVER GREATER THAN 100.4 F (38 C) OR HIGHER *CHILLS OR SWEATING *NAUSEA AND VOMITING THAT IS NOT CONTROLLED WITH YOUR NAUSEA MEDICATION *UNUSUAL SHORTNESS OF BREATH *UNUSUAL BRUISING OR BLEEDING *URINARY PROBLEMS (pain or burning when urinating, or frequent urination) *BOWEL PROBLEMS (unusual diarrhea, constipation, pain near the anus) TENDERNESS IN MOUTH AND THROAT WITH OR WITHOUT PRESENCE OF ULCERS (sore throat, sores in mouth, or a toothache) UNUSUAL RASH, SWELLING OR PAIN  UNUSUAL VAGINAL DISCHARGE OR ITCHING   Items with * indicate a potential emergency and should be followed up as soon as possible or go to the Emergency Department if any problems should occur.  Please show the CHEMOTHERAPY ALERT CARD or IMMUNOTHERAPY ALERT CARD at check-in to the Emergency Department and triage nurse.  Should you have questions after your visit or need to cancel or reschedule your appointment, please contact [facility] [phone]  and follow the prompts.  Office hours are 8:00 a.m. to 4:30 p.m. Monday - Friday. Please note that voicemails left after 4:00 p.m. may not be returned until the following business day.  We are closed weekends and major holidays. You have access to a nurse at all times for urgent questions. Please call the main number to the clinic [phone] and follow the prompts.  For any non-urgent questions, you may also contact your provider using MyChart. We now offer e-Visits for anyone 58 and older to request care online for non-urgent symptoms. For details visit [email or web address].   Also download the MyChart app! Go to the app store, search "MyChart", open the app, select [facility], and log in with your MyChart username and password.  Masks are optional in the cancer centers. If you would like for your care team to wear a mask while they are taking care of you, please let them know. You may have one support person who is at least 63 years old accompany you for your appointments.

## 2022-06-02 LAB — CBC
HCT: 28.7 % — ABNORMAL LOW (ref 39.0–52.0)
Hemoglobin: 9.4 g/dL — ABNORMAL LOW (ref 13.0–17.0)
MCH: 32.9 pg (ref 26.0–34.0)
MCHC: 32.8 g/dL (ref 30.0–36.0)
MCV: 100.3 fL — ABNORMAL HIGH (ref 80.0–100.0)
Platelets: 492 10*3/uL — ABNORMAL HIGH (ref 150–400)
RBC: 2.86 MIL/uL — ABNORMAL LOW (ref 4.22–5.81)
RDW: 29 % — ABNORMAL HIGH (ref 11.5–15.5)
WBC: 3.9 10*3/uL — ABNORMAL LOW (ref 4.0–10.5)
nRBC: 0 % (ref 0.0–0.2)

## 2022-06-08 ENCOUNTER — Inpatient Hospital Stay (HOSPITAL_BASED_OUTPATIENT_CLINIC_OR_DEPARTMENT_OTHER): Payer: 59 | Admitting: Hematology

## 2022-06-08 ENCOUNTER — Inpatient Hospital Stay: Payer: 59

## 2022-06-08 VITALS — BP 143/69 | HR 71 | Temp 98.6°F | Resp 17 | Ht 67.0 in | Wt 235.3 lb

## 2022-06-08 DIAGNOSIS — D649 Anemia, unspecified: Secondary | ICD-10-CM

## 2022-06-08 DIAGNOSIS — D469 Myelodysplastic syndrome, unspecified: Secondary | ICD-10-CM

## 2022-06-08 DIAGNOSIS — D461 Refractory anemia with ring sideroblasts: Secondary | ICD-10-CM | POA: Diagnosis not present

## 2022-06-08 LAB — IRON AND TIBC
Iron: 207 ug/dL — ABNORMAL HIGH (ref 45–182)
Saturation Ratios: 88 % — ABNORMAL HIGH (ref 17.9–39.5)
TIBC: 236 ug/dL — ABNORMAL LOW (ref 250–450)
UIBC: 29 ug/dL

## 2022-06-08 LAB — COMPREHENSIVE METABOLIC PANEL
ALT: 37 U/L (ref 0–44)
AST: 28 U/L (ref 15–41)
Albumin: 4 g/dL (ref 3.5–5.0)
Alkaline Phosphatase: 73 U/L (ref 38–126)
Anion gap: 8 (ref 5–15)
BUN: 15 mg/dL (ref 8–23)
CO2: 26 mmol/L (ref 22–32)
Calcium: 9.3 mg/dL (ref 8.9–10.3)
Chloride: 105 mmol/L (ref 98–111)
Creatinine, Ser: 0.97 mg/dL (ref 0.61–1.24)
GFR, Estimated: >60 mL/min (ref >60)
Glucose, Bld: 181 mg/dL — ABNORMAL HIGH (ref 70–99)
Potassium: 3.5 mmol/L (ref 3.5–5.1)
Sodium: 139 mmol/L (ref 135–145)
Total Bilirubin: 1.1 mg/dL (ref 0.3–1.2)
Total Protein: 7.6 g/dL (ref 6.5–8.1)

## 2022-06-08 LAB — SAMPLE TO BLOOD BANK

## 2022-06-08 LAB — CBC WITH DIFFERENTIAL/PLATELET
Abs Immature Granulocytes: 0.01 10*3/uL (ref 0.00–0.07)
Basophils Absolute: 0 10*3/uL (ref 0.0–0.1)
Basophils Relative: 1 %
Eosinophils Absolute: 0.1 10*3/uL (ref 0.0–0.5)
Eosinophils Relative: 3 %
HCT: 25.8 % — ABNORMAL LOW (ref 39.0–52.0)
Hemoglobin: 8.6 g/dL — ABNORMAL LOW (ref 13.0–17.0)
Immature Granulocytes: 0 %
Lymphocytes Relative: 42 %
Lymphs Abs: 1.9 10*3/uL (ref 0.7–4.0)
MCH: 32.6 pg (ref 26.0–34.0)
MCHC: 33.3 g/dL (ref 30.0–36.0)
MCV: 97.7 fL (ref 80.0–100.0)
Monocytes Absolute: 0.5 10*3/uL (ref 0.1–1.0)
Monocytes Relative: 11 %
Neutro Abs: 1.9 10*3/uL (ref 1.7–7.7)
Neutrophils Relative %: 43 %
Platelets: 484 10*3/uL — ABNORMAL HIGH (ref 150–400)
RBC: 2.64 MIL/uL — ABNORMAL LOW (ref 4.22–5.81)
RDW: 28.8 % — ABNORMAL HIGH (ref 11.5–15.5)
WBC: 4.4 10*3/uL (ref 4.0–10.5)
nRBC: 0 % (ref 0.0–0.2)

## 2022-06-08 LAB — RETICULOCYTES
Immature Retic Fract: 32 % — ABNORMAL HIGH (ref 2.3–15.9)
RBC.: 2.71 MIL/uL — ABNORMAL LOW (ref 4.22–5.81)
Retic Ct Pct: <0.4 % — ABNORMAL LOW (ref 0.4–3.1)

## 2022-06-08 LAB — LACTATE DEHYDROGENASE: LDH: 142 U/L (ref 98–192)

## 2022-06-08 LAB — FERRITIN: Ferritin: 1040 ng/mL — ABNORMAL HIGH (ref 24–336)

## 2022-06-08 MED ORDER — EPOETIN ALFA 40000 UNIT/ML IJ SOLN
40000.0000 [IU] | Freq: Once | INTRAMUSCULAR | Status: AC
  Administered 2022-06-08: 40000 [IU] via SUBCUTANEOUS
  Filled 2022-06-08: qty 1

## 2022-06-08 MED ORDER — EPOETIN ALFA 20000 UNIT/ML IJ SOLN
20000.0000 [IU] | Freq: Once | INTRAMUSCULAR | Status: AC
  Administered 2022-06-08: 20000 [IU] via SUBCUTANEOUS
  Filled 2022-06-08: qty 1

## 2022-06-08 NOTE — Progress Notes (Signed)
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 

## 2022-06-08 NOTE — Patient Instructions (Signed)
[facility]  Discharge Instructions: Thank you for choosing [facility] to provide your oncology and hematology care.  If you have a lab appointment with the [facility], please come in thru the Main Entrance and check in at the main information desk.  Wear comfortable clothing and clothing appropriate for easy access to any Portacath or PICC line.   We strive to give you quality time with your provider. You may need to reschedule your appointment if you arrive late (15 or more minutes).  Arriving late affects you and other patients whose appointments are after yours.  Also, if you miss three or more appointments without notifying the office, you may be dismissed from the clinic at the provider's discretion.      For prescription refill requests, have your pharmacy contact our office and allow 72 hours for refills to be completed.    Today you received the following chemotherapy and/or immunotherapy agents Epogen injection 60,000 units.  Epoetin Alfa Injection What is this medication? EPOETIN ALFA (e POE e tin AL fa) treats low levels of red blood cells (anemia) caused by kidney disease, chemotherapy, or HIV medications. It can also be used in people who are at risk for blood loss during surgery. It works by Building control surveyor make more red blood cells, which reduces the need for blood transfusions. This medicine may be used for other purposes; ask your health care provider or pharmacist if you have questions. COMMON BRAND NAME(S): Epogen, Procrit, Retacrit What should I tell my care team before I take this medication? They need to know if you have any of these conditions: Blood clots Cancer Heart disease High blood pressure On dialysis Seizures Stroke An unusual or allergic reaction to epoetin alfa, albumin, benzyl alcohol, other medications, foods, dyes, or preservatives Pregnant or trying to get pregnant Breast-feeding How should I use this  medication? This medication is injected into a vein or under the skin. It is usually given by your care team in a hospital or clinic setting. It may also be given at home. If you get this medication at home, you will be taught how to prepare and give it. Use exactly as directed. Take it as directed on the prescription label at the same time every day. Keep taking it unless your care team tells you to stop. It is important that you put your used needles and syringes in a special sharps container. Do not put them in a trash can. If you do not have a sharps container, call your pharmacist or care team to get one. A special MedGuide will be given to you by the pharmacist with each prescription and refill. Be sure to read this information carefully each time. Talk to your care team about the use of this medication in children. While this medication may be used in children as young as 74 month of age for selected conditions, precautions do apply. Overdosage: If you think you have taken too much of this medicine contact a poison control center or emergency room at once. NOTE: This medicine is only for you. Do not share this medicine with others. What if I miss a dose? If you miss a dose, take it as soon as you can. If it is almost time for your next dose, take only that dose. Do not take double or extra doses. What may interact with this medication? Darbepoetin alfa Methoxy polyethylene glycol-epoetin beta This list may not describe all possible interactions. Give your  health care provider a list of all the medicines, herbs, non-prescription drugs, or dietary supplements you use. Also tell them if you smoke, drink alcohol, or use illegal drugs. Some items may interact with your medicine. What should I watch for while using this medication? Visit your care team for regular checks on your progress. Check your blood pressure as directed. Know what your blood pressure should be and when to contact your care team.  Your condition will be monitored carefully while you are receiving this medication. You may need blood work while taking this medication. What side effects may I notice from receiving this medication? Side effects that you should report to your care team as soon as possible: Allergic reactions--skin rash, itching, hives, swelling of the face, lips, tongue, or throat Blood clot--pain, swelling, or warmth in the leg, shortness of breath, chest pain Heart attack--pain or tightness in the chest, shoulders, arms, or jaw, nausea, shortness of breath, cold or clammy skin, feeling faint or lightheaded Increase in blood pressure Rash, fever, and swollen lymph nodes Redness, blistering, peeling, or loosening of the skin, including inside the mouth Seizures Stroke--sudden numbness or weakness of the face, arm, or leg, trouble speaking, confusion, trouble walking, loss of balance or coordination, dizziness, severe headache, change in vision Side effects that usually do not require medical attention (report to your care team if they continue or are bothersome): Bone, joint, or muscle pain Cough Headache Nausea Pain, redness, or irritation at injection site This list may not describe all possible side effects. Call your doctor for medical advice about side effects. You may report side effects to FDA at 1-800-FDA-1088. Where should I keep my medication? Keep out of the reach of children and pets. Store in a refrigerator. Do not freeze. Do not shake. Protect from light. Keep this medication in the original container until you are ready to take it. See product for storage information. Get rid of any unused medication after the expiration date. To get rid of medications that are no longer needed or have expired: Take the medication to a medication take-back program. Check with your pharmacy or law enforcement to find a location. If you cannot return the medication, ask your pharmacist or care team how to get  rid of the medication safely. NOTE: This sheet is a summary. It may not cover all possible information. If you have questions about this medicine, talk to your doctor, pharmacist, or health care provider.  2023 Elsevier/Gold Standard (2021-11-03 00:00:00)       To help prevent nausea and vomiting after your treatment, we encourage you to take your nausea medication as directed.  BELOW ARE SYMPTOMS THAT SHOULD BE REPORTED IMMEDIATELY: *FEVER GREATER THAN 100.4 F (38 C) OR HIGHER *CHILLS OR SWEATING *NAUSEA AND VOMITING THAT IS NOT CONTROLLED WITH YOUR NAUSEA MEDICATION *UNUSUAL SHORTNESS OF BREATH *UNUSUAL BRUISING OR BLEEDING *URINARY PROBLEMS (pain or burning when urinating, or frequent urination) *BOWEL PROBLEMS (unusual diarrhea, constipation, pain near the anus) TENDERNESS IN MOUTH AND THROAT WITH OR WITHOUT PRESENCE OF ULCERS (sore throat, sores in mouth, or a toothache) UNUSUAL RASH, SWELLING OR PAIN  UNUSUAL VAGINAL DISCHARGE OR ITCHING   Items with * indicate a potential emergency and should be followed up as soon as possible or go to the Emergency Department if any problems should occur.  Please show the CHEMOTHERAPY ALERT CARD or IMMUNOTHERAPY ALERT CARD at check-in to the Emergency Department and triage nurse.  Should you have questions after your visit or need to  cancel or reschedule your appointment, please contact [facility] [phone]  and follow the prompts.  Office hours are 8:00 a.m. to 4:30 p.m. Monday - Friday. Please note that voicemails left after 4:00 p.m. may not be returned until the following business day.  We are closed weekends and major holidays. You have access to a nurse at all times for urgent questions. Please call the main number to the clinic [phone] and follow the prompts.  For any non-urgent questions, you may also contact your provider using MyChart. We now offer e-Visits for anyone 80 and older to request care online for  non-urgent symptoms. For details visit [email or web address].   Also download the MyChart app! Go to the app store, search "MyChart", open the app, select [facility], and log in with your MyChart username and password.  Masks are optional in the cancer centers. If you would like for your care team to wear a mask while they are taking care of you, please let them know. You may have one support person who is at least 63 years old accompany you for your appointments.

## 2022-06-08 NOTE — Progress Notes (Signed)
Stephen Watts, [place] [address]   CLINIC:  Medical Oncology/Hematology  PCP:  Stephen Watts, [place] [address] Dr. 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, a 61 y.o. male, seen for follow-up of MDS and anemia.  Reports energy level 75%.  He is tolerating weekly Retacrit very well.  Blood pressure at home has been stable.  He has not required any blood transfusion since August.  REVIEW OF SYSTEMS:  Review of Systems  Gastrointestinal:  Negative for blood in stool.  Genitourinary:  Negative for frequency and hematuria.   Neurological:  Negative for light-headedness.  Psychiatric/Behavioral:  Negative for sleep disturbance.   All other systems reviewed and are negative.   PAST MEDICAL/SURGICAL HISTORY:  No past medical history on file. No past surgical history on file.  SOCIAL HISTORY:  Social History   Socioeconomic History   Marital status: Married    Spouse name: Not on file   Number of children: Not on file   Years of education: Not on file   Highest education level: Not on file  Occupational History   Not on file  Tobacco Use   Smoking status: Not on file   Smokeless tobacco: Not on file  Substance and Sexual Activity   Alcohol use: Not on file   Drug use: Not on file   Sexual activity: Not on file  Other Topics Concern   Not on file  Social History Narrative   Not on file   Social Determinants of Health   Financial Resource Strain: Not on file  Food Insecurity: Not on file  Transportation Needs: Not on file  Physical Activity: Not on file  Stress: Not on file  Social Connections: Not on file  Intimate Partner Violence: Not on file    FAMILY HISTORY:  No family history on file.  CURRENT MEDICATIONS:  Current Outpatient Medications  Medication Sig Dispense Refill    amLODipine (NORVASC) 5 MG tablet Take 5 mg by mouth daily.     glimepiride (AMARYL) 2 MG tablet Take 2 mg by mouth daily.     hydrochlorothiazide (HYDRODIURIL) 25 MG tablet Take 25 mg by mouth daily.     lisinopril (ZESTRIL) 40 MG tablet Take 40 mg by mouth daily.     lisinopril-hydrochlorothiazide (ZESTORETIC) 20-25 MG tablet Take 1 tablet by mouth daily.     metFORMIN (GLUCOPHAGE) 1000 MG tablet Take 1,000 mg by mouth 2 (two) times daily.     metoprolol succinate (TOPROL-XL) 50 MG 24 hr tablet Take 50 mg by mouth daily.     sildenafil (VIAGRA) 50 MG tablet Take 50 mg by mouth daily as needed.     No current facility-administered medications for this visit.   Facility-Administered Medications Ordered in Other Visits  Medication Dose Route Frequency Provider Last Rate Last Admin   acetaminophen (TYLENOL) 325 MG tablet            loratadine (CLARITIN) 10 MG tablet             ALLERGIES:  Allergies  Allergen Reactions   Ibuprofen Hives   Sulfa Antibiotics Rash    PHYSICAL EXAM:  Performance status (ECOG): 0 - Asymptomatic  Vitals:   06/08/22 1217  BP: (!) 143/69  Pulse: 71  Resp: 17  Temp: 98.6 F (37 C)  SpO2: 100%  Wt Readings from Last 3 Encounters:  06/08/22 235 lb 4.8 oz (106.7 kg)  05/18/22 228 lb 3.2 oz (103.5 kg)  04/13/22 226 lb 8 oz (102.7 kg)   Physical Exam Vitals reviewed.  Constitutional:      Appearance: Normal appearance. He is obese.  Cardiovascular:     Rate and Rhythm: Normal rate and regular rhythm.     Pulses: Normal pulses.     Heart sounds: Normal heart sounds.  Pulmonary:     Effort: Pulmonary effort is normal.     Breath sounds: Normal breath sounds.  Neurological:     General: No focal deficit present.     Mental Status: He is alert and oriented to person, place, and time.  Psychiatric:        Mood and Affect: Mood normal.        Behavior: Behavior normal.     LABORATORY DATA:  I have reviewed the labs as listed.     Latest  Ref Rng & Units 06/08/2022   11:17 AM 06/01/2022   10:07 AM 05/25/2022    9:07 AM  CBC  WBC 4.0 - 10.5 K/uL 4.4  3.9  5.7   Hemoglobin 13.0 - 17.0 g/dL 8.6  9.4  9.5   Hematocrit 39.0 - 52.0 % 25.8  28.7  29.1   Platelets 150 - 400 K/uL 484  492  564       Latest Ref Rng & Units 06/08/2022   11:17 AM 09/11/2021   12:49 PM  CMP  Glucose 70 - 99 mg/dL 181  175   BUN 8 - 23 mg/dL 15  15   Creatinine 0.61 - 1.24 mg/dL 0.97  1.00   Sodium 135 - 145 mmol/L 139  137   Potassium 3.5 - 5.1 mmol/L 3.5  3.7   Chloride 98 - 111 mmol/L 105  103   CO2 22 - 32 mmol/L 26  25   Calcium 8.9 - 10.3 mg/dL 9.3  9.6   Total Protein 6.5 - 8.1 g/dL 7.6  8.2   Total Bilirubin 0.3 - 1.2 mg/dL 1.1  1.0   Alkaline Phos 38 - 126 U/L 73  53   AST 15 - 41 U/L 28  23   ALT 0 - 44 U/L 37  22       Component Value Date/Time   RBC 2.71 (L) 06/08/2022 1118   RBC 2.64 (L) 06/08/2022 1117   MCV 97.7 06/08/2022 1117   MCH 32.6 06/08/2022 1117   MCHC 33.3 06/08/2022 1117   RDW 28.8 (H) 06/08/2022 1117   LYMPHSABS 1.9 06/08/2022 1117   MONOABS 0.5 06/08/2022 1117   EOSABS 0.1 06/08/2022 1117   BASOSABS 0.0 06/08/2022 1117    DIAGNOSTIC IMAGING:  I have independently reviewed the scans and discussed with the patient. No results found.   ASSESSMENT:  MDS with ringed sideroblasts and thrombocytosis: - Patient seen for normocytic anemia with CBC on 05/08/2021 showing hemoglobin 8.4, MCV 97 and platelet count 631. - Ferritin was 1454, percent saturation was 85.  Hemochromatosis was negative. - He is taking iron tablet 2 times daily for the last 1 year. - Colonoscopy was reportedly done in [place] 3 years ago, within normal limits. - BMBX (10/08/2021): Hypercellular marrow with erythroid hyperplasia, erythroid dysplasia and ring sideroblasts.  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 -  IPSS M: Score is -0.73 (  low risk).  IPSS-R score 2.5.  Median leukemia free survival was 5.9%.  Median overall survival was 6 years.  AML transformation rate was 1.7 %/year. - Ring sideroblasts were more than 15%.  JAK2 V617F and reflex testing and BCR/ABL by FISH were negative for thrombocytosis. - Serum EPO was 107. - Retacrit 30,000 units weekly started on 11/26/2021, dose changed to 40,000 units every 2 weeks on 12/31/2021, dose increased to 60,000 units weekly on 03/01/2022.    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 - Denies any smoking history. - Paternal aunt had pancreatic cancer.  Sister had breast cancer.     PLAN:  MDS with ring sideroblasts and thrombocytosis: -He is tolerating weekly Retacrit 60,000 units without any major difficulty. - His hemoglobin is ranging between 8-9 0.5.  He has not required any transfusions since August of this year. - Reviewed labs today which showed normal LFTs and creatinine.  Ferritin was 1060 and percent saturation is 88.  Hemoglobin is 8.6 today. - He will receive Retacrit today. - We talked about starting him on Luspatercept 1 mg/kg dose every 3 weeks and titrate up as needed.  We will obtain authorization from his insurance.  Initially for the first 1 or 2 weeks, we will overlap with Retacrit to maintain hemoglobin level.  We talked about side effects of Luspatercept in detail.  If Luspatercept is denied by his insurance, consider Aranesp 500 mcg every 2 weeks. - RTC with cycle 2 of Luspatercept.  2.  Elevated ferritin (hemochromatosis negative): - Ferritin has improved from 1200-1040. - Percent saturation is 88.  Closely monitor.  3.  Hypertension: - Continue lisinopril, HCTZ, Norvasc.  Blood pressure today is 140/69.  Orders placed this encounter:  No orders of the defined types were placed in this encounter.    Derek Jack, MD [place] [phone]

## 2022-06-08 NOTE — Progress Notes (Signed)
Stephen Watts presents today for injection per the provider's orders.  Epogen 60,000 units administration without incident; injection site WNL; see MAR for injection details.  Patient tolerated procedure well and without incident.  No questions or complaints noted at this time. Treatment given today per MD No Discharged from clinic ambulatory in stable condition. Alert and oriented x 3. F/U with [facility] as scheduled.

## 2022-06-08 NOTE — Patient Instructions (Signed)
[place] at [facility] Discharge Instructions   You were seen and examined today by Dr. Delton Coombes.  He reviewed your lab results. Your hemoglobin is 8.6. You will receive your Retacrit injection today.   We will plan to add an additional injection called Reblozyl. It is given once every 3 weeks. Once this shot starts working we can likely space out the frequency of the Retacrit injections.   Return as scheduled.    Thank you for choosing [place] at [facility] to provide your oncology and hematology care.  To afford each patient quality time with our provider, please arrive at least 15 minutes before your scheduled appointment time.   If you have a lab appointment with the [facility] please come in thru the Main Entrance and check in at the main information desk.  You need to re-schedule your appointment should you arrive 10 or more minutes late.  We strive to give you quality time with our providers, and arriving late affects you and other patients whose appointments are after yours.  Also, if you no show three or more times for appointments you may be dismissed from the clinic at the providers discretion.     Again, thank you for choosing [facility].  Our hope is that these requests will decrease the amount of time that you wait before being seen by our physicians.       _____________________________________________________________  Should you have questions after your visit to [facility], please contact our office at [phone] and follow the prompts.  Our office hours are 8:00 a.m. and 4:30 p.m. Monday - Friday.  Please note that voicemails left after 4:00 p.m. may not be returned until the following business day.  We are closed weekends and major holidays.  You do have access to a nurse 24-7, just call the main number to the clinic [phone] and do not press any options, hold on the line and a nurse  will answer the phone.    For prescription refill requests, have your pharmacy contact our office and allow 72 hours.    Due to Covid, you will need to wear a mask upon entering the hospital. If you do not have a mask, a mask will be given to you at the Main Entrance upon arrival. For doctor visits, patients may have 1 support person age 58 or older with them. For treatment visits, patients can not have anyone with them due to social distancing guidelines and our immunocompromised population.

## 2022-06-09 ENCOUNTER — Other Ambulatory Visit: Payer: Self-pay

## 2022-06-09 NOTE — Progress Notes (Signed)
Pharmacist Chemotherapy Monitoring - Initial Assessment    Anticipated start date: 06/15/22   The following has been reviewed per standard work regarding the patient's treatment regimen: The patient's diagnosis, treatment plan and drug doses, and organ/hematologic function Lab orders and baseline tests specific to treatment regimen  The treatment plan start date, drug sequencing, and pre-medications Prior authorization status  Patient's documented medication list, including drug-drug interaction screen and prescriptions for anti-emetics and supportive care specific to the treatment regimen The drug concentrations, fluid compatibility, administration routes, and timing of the medications to be used The patient's access for treatment and lifetime cumulative dose history, if applicable  The patient's medication allergies and previous infusion related reactions, if applicable   Changes made to treatment plan:  N/A  Follow up needed:  N/A   Wynona Neat, [facility], 06/09/2022  4:10 PM

## 2022-06-14 ENCOUNTER — Other Ambulatory Visit: Payer: Self-pay | Admitting: Hematology

## 2022-06-14 DIAGNOSIS — D469 Myelodysplastic syndrome, unspecified: Secondary | ICD-10-CM

## 2022-06-15 ENCOUNTER — Inpatient Hospital Stay: Payer: 59 | Attending: Hematology

## 2022-06-15 ENCOUNTER — Other Ambulatory Visit: Payer: Self-pay

## 2022-06-15 ENCOUNTER — Inpatient Hospital Stay: Payer: 59

## 2022-06-15 VITALS — BP 155/70 | HR 80 | Temp 97.4°F | Resp 18 | Wt 236.2 lb

## 2022-06-15 DIAGNOSIS — Z882 Allergy status to sulfonamides status: Secondary | ICD-10-CM | POA: Insufficient documentation

## 2022-06-15 DIAGNOSIS — I10 Essential (primary) hypertension: Secondary | ICD-10-CM | POA: Insufficient documentation

## 2022-06-15 DIAGNOSIS — D461 Refractory anemia with ring sideroblasts: Secondary | ICD-10-CM | POA: Diagnosis not present

## 2022-06-15 DIAGNOSIS — D469 Myelodysplastic syndrome, unspecified: Secondary | ICD-10-CM

## 2022-06-15 DIAGNOSIS — D75839 Thrombocytosis, unspecified: Secondary | ICD-10-CM | POA: Insufficient documentation

## 2022-06-15 DIAGNOSIS — D649 Anemia, unspecified: Secondary | ICD-10-CM

## 2022-06-15 DIAGNOSIS — Z79899 Other long term (current) drug therapy: Secondary | ICD-10-CM | POA: Diagnosis not present

## 2022-06-15 DIAGNOSIS — R7989 Other specified abnormal findings of blood chemistry: Secondary | ICD-10-CM | POA: Diagnosis not present

## 2022-06-15 DIAGNOSIS — Z886 Allergy status to analgesic agent status: Secondary | ICD-10-CM | POA: Insufficient documentation

## 2022-06-15 LAB — CBC
HCT: 24.5 % — ABNORMAL LOW (ref 39.0–52.0)
Hemoglobin: 8.2 g/dL — ABNORMAL LOW (ref 13.0–17.0)
MCH: 32.9 pg (ref 26.0–34.0)
MCHC: 33.5 g/dL (ref 30.0–36.0)
MCV: 98.4 fL (ref 80.0–100.0)
Platelets: 545 10*3/uL — ABNORMAL HIGH (ref 150–400)
RBC: 2.49 MIL/uL — ABNORMAL LOW (ref 4.22–5.81)
RDW: 28.6 % — ABNORMAL HIGH (ref 11.5–15.5)
WBC: 5.8 10*3/uL (ref 4.0–10.5)
nRBC: 0.5 % — ABNORMAL HIGH (ref 0.0–0.2)

## 2022-06-15 MED ORDER — EPOETIN ALFA 40000 UNIT/ML IJ SOLN
40000.0000 [IU] | Freq: Once | INTRAMUSCULAR | Status: AC
  Administered 2022-06-15: 40000 [IU] via SUBCUTANEOUS
  Filled 2022-06-15: qty 1

## 2022-06-15 MED ORDER — EPOETIN ALFA 20000 UNIT/ML IJ SOLN
20000.0000 [IU] | Freq: Once | INTRAMUSCULAR | Status: AC
  Administered 2022-06-15: 20000 [IU] via SUBCUTANEOUS
  Filled 2022-06-15: qty 1

## 2022-06-15 MED ORDER — LUSPATERCEPT-AAMT 75 MG ~~LOC~~ SOLR
100.0000 mg | Freq: Once | SUBCUTANEOUS | Status: AC
  Administered 2022-06-15: 100 mg via SUBCUTANEOUS
  Filled 2022-06-15: qty 1.5

## 2022-06-15 NOTE — Progress Notes (Signed)
Md Smola presents today for injection per the provider's orders.  Epogen 60,000 units and Luspatercept administration without incident; injection site WNL; see MAR for injection details.  Patient tolerated procedure well and without incident.  No questions or complaints noted at this time. Pt's hemoglobin noted to be 8.2 today.  Discharged from clinic ambulatory in stable condition. Alert and oriented x 3. F/U with [facility] as scheduled.

## 2022-06-15 NOTE — Patient Instructions (Signed)
[place]  Discharge Instructions: Thank you for choosing [facility] to provide your oncology and hematology care.  If you have a lab appointment with the [facility], please come in thru the Main Entrance and check in at the main information desk.  Wear comfortable clothing and clothing appropriate for easy access to any Portacath or PICC line.   We strive to give you quality time with your provider. You may need to reschedule your appointment if you arrive late (15 or more minutes).  Arriving late affects you and other patients whose appointments are after yours.  Also, if you miss three or more appointments without notifying the office, you may be dismissed from the clinic at the provider's discretion.      For prescription refill requests, have your pharmacy contact our office and allow 72 hours for refills to be completed.    Today you received the following chemotherapy and/or immunotherapy agents Epogen and Luspartercept   To help prevent nausea and vomiting after your treatment, we encourage you to take your nausea medication as directed.  BELOW ARE SYMPTOMS THAT SHOULD BE REPORTED IMMEDIATELY: *FEVER GREATER THAN 100.4 F (38 C) OR HIGHER *CHILLS OR SWEATING *NAUSEA AND VOMITING THAT IS NOT CONTROLLED WITH YOUR NAUSEA MEDICATION *UNUSUAL SHORTNESS OF BREATH *UNUSUAL BRUISING OR BLEEDING *URINARY PROBLEMS (pain or burning when urinating, or frequent urination) *BOWEL PROBLEMS (unusual diarrhea, constipation, pain near the anus) TENDERNESS IN MOUTH AND THROAT WITH OR WITHOUT PRESENCE OF ULCERS (sore throat, sores in mouth, or a toothache) UNUSUAL RASH, SWELLING OR PAIN  UNUSUAL VAGINAL DISCHARGE OR ITCHING   Items with * indicate a potential emergency and should be followed up as soon as possible or go to the Emergency Department if any problems should occur.  Please show the CHEMOTHERAPY ALERT CARD or IMMUNOTHERAPY ALERT CARD at check-in to  the Emergency Department and triage nurse.  Should you have questions after your visit or need to cancel or reschedule your appointment, please contact [facility] [phone]  and follow the prompts.  Office hours are 8:00 a.m. to 4:30 p.m. Monday - Friday. Please note that voicemails left after 4:00 p.m. may not be returned until the following business day.  We are closed weekends and major holidays. You have access to a nurse at all times for urgent questions. Please call the main number to the clinic [phone] and follow the prompts.  For any non-urgent questions, you may also contact your provider using MyChart. We now offer e-Visits for anyone 30 and older to request care online for non-urgent symptoms. For details visit [email or web address].   Also download the MyChart app! Go to the app store, search "MyChart", open the app, select [facility], and log in with your MyChart username and password.  Masks are optional in the cancer centers. If you would like for your care team to wear a mask while they are taking care of you, please let them know. You may have one support person who is at least 63 years old accompany you for your appointments.

## 2022-06-17 ENCOUNTER — Other Ambulatory Visit: Payer: Self-pay

## 2022-06-22 ENCOUNTER — Inpatient Hospital Stay: Payer: 59

## 2022-06-22 VITALS — BP 161/78 | HR 65 | Temp 97.7°F | Resp 18

## 2022-06-22 DIAGNOSIS — D649 Anemia, unspecified: Secondary | ICD-10-CM

## 2022-06-22 DIAGNOSIS — Z79899 Other long term (current) drug therapy: Secondary | ICD-10-CM | POA: Diagnosis not present

## 2022-06-22 DIAGNOSIS — D469 Myelodysplastic syndrome, unspecified: Secondary | ICD-10-CM

## 2022-06-22 DIAGNOSIS — D461 Refractory anemia with ring sideroblasts: Secondary | ICD-10-CM | POA: Diagnosis not present

## 2022-06-22 DIAGNOSIS — I10 Essential (primary) hypertension: Secondary | ICD-10-CM | POA: Diagnosis not present

## 2022-06-22 DIAGNOSIS — Z882 Allergy status to sulfonamides status: Secondary | ICD-10-CM | POA: Diagnosis not present

## 2022-06-22 DIAGNOSIS — D75839 Thrombocytosis, unspecified: Secondary | ICD-10-CM | POA: Diagnosis not present

## 2022-06-22 DIAGNOSIS — Z886 Allergy status to analgesic agent status: Secondary | ICD-10-CM | POA: Diagnosis not present

## 2022-06-22 DIAGNOSIS — R7989 Other specified abnormal findings of blood chemistry: Secondary | ICD-10-CM | POA: Diagnosis not present

## 2022-06-22 LAB — CBC
HCT: 30.3 % — ABNORMAL LOW (ref 39.0–52.0)
Hemoglobin: 10 g/dL — ABNORMAL LOW (ref 13.0–17.0)
MCH: 32.8 pg (ref 26.0–34.0)
MCHC: 33 g/dL (ref 30.0–36.0)
MCV: 99.3 fL (ref 80.0–100.0)
Platelets: 519 10*3/uL — ABNORMAL HIGH (ref 150–400)
RBC: 3.05 MIL/uL — ABNORMAL LOW (ref 4.22–5.81)
RDW: 31.2 % — ABNORMAL HIGH (ref 11.5–15.5)
WBC: 6.4 10*3/uL (ref 4.0–10.5)
nRBC: 3 % — ABNORMAL HIGH (ref 0.0–0.2)

## 2022-06-22 LAB — SAMPLE TO BLOOD BANK

## 2022-06-22 MED ORDER — EPOETIN ALFA 40000 UNIT/ML IJ SOLN
40000.0000 [IU] | Freq: Once | INTRAMUSCULAR | Status: AC
  Administered 2022-06-22: 40000 [IU] via SUBCUTANEOUS
  Filled 2022-06-22: qty 1

## 2022-06-22 MED ORDER — EPOETIN ALFA 20000 UNIT/ML IJ SOLN
20000.0000 [IU] | Freq: Once | INTRAMUSCULAR | Status: AC
  Administered 2022-06-22: 20000 [IU] via SUBCUTANEOUS
  Filled 2022-06-22: qty 1

## 2022-06-22 NOTE — Progress Notes (Signed)
Patient presents today for Epogen injection per providers order.  Vital signs within parameters for treatment.  Patient has no new complaints at this time.  Hgb today was 10.0.  Stable during administration without incident; injection site WNL; see MAR for injection details.  Patient tolerated procedure well and without incident.  No questions or complaints noted at this time.

## 2022-06-22 NOTE — Patient Instructions (Signed)
[facility] [facility]  Discharge Instructions: Thank you for choosing [facility] to provide your oncology and hematology care.  If you have a lab appointment with the [facility], please come in thru the Main Entrance and check in at the main information desk.  Wear comfortable clothing and clothing appropriate for easy access to any Portacath or PICC line.   We strive to give you quality time with your provider. You may need to reschedule your appointment if you arrive late (15 or more minutes).  Arriving late affects you and other patients whose appointments are after yours.  Also, if you miss three or more appointments without notifying the office, you may be dismissed from the clinic at the provider's discretion.      For prescription refill requests, have your pharmacy contact our office and allow 72 hours for refills to be completed.    Today you received the following chemotherapy and/or immunotherapy agents Epogen      To help prevent nausea and vomiting after your treatment, we encourage you to take your nausea medication as directed.  BELOW ARE SYMPTOMS THAT SHOULD BE REPORTED IMMEDIATELY: *FEVER GREATER THAN 100.4 F (38 C) OR HIGHER *CHILLS OR SWEATING *NAUSEA AND VOMITING THAT IS NOT CONTROLLED WITH YOUR NAUSEA MEDICATION *UNUSUAL SHORTNESS OF BREATH *UNUSUAL BRUISING OR BLEEDING *URINARY PROBLEMS (pain or burning when urinating, or frequent urination) *BOWEL PROBLEMS (unusual diarrhea, constipation, pain near the anus) TENDERNESS IN MOUTH AND THROAT WITH OR WITHOUT PRESENCE OF ULCERS (sore throat, sores in mouth, or a toothache) UNUSUAL RASH, SWELLING OR PAIN  UNUSUAL VAGINAL DISCHARGE OR ITCHING   Items with * indicate a potential emergency and should be followed up as soon as possible or go to the Emergency Department if any problems should occur.  Please show the CHEMOTHERAPY ALERT CARD or IMMUNOTHERAPY ALERT CARD at check-in to the Emergency  Department and triage nurse.  Should you have questions after your visit or need to cancel or reschedule your appointment, please contact [facility] [phone]  and follow the prompts.  Office hours are 8:00 a.m. to 4:30 p.m. Monday - Friday. Please note that voicemails left after 4:00 p.m. may not be returned until the following business day.  We are closed weekends and major holidays. You have access to a nurse at all times for urgent questions. Please call the main number to the clinic [phone] and follow the prompts.  For any non-urgent questions, you may also contact your provider using MyChart. We now offer e-Visits for anyone 19 and older to request care online for non-urgent symptoms. For details visit [email or web address].   Also download the MyChart app! Go to the app store, search "MyChart", open the app, select [facility], and log in with your MyChart username and password.  Masks are optional in the cancer centers. If you would like for your care team to wear a mask while they are taking care of you, please let them know. You may have one support person who is at least 63 years old accompany you for your appointments.

## 2022-06-26 ENCOUNTER — Other Ambulatory Visit: Payer: Self-pay

## 2022-06-29 ENCOUNTER — Inpatient Hospital Stay: Payer: 59

## 2022-06-29 DIAGNOSIS — Z79899 Other long term (current) drug therapy: Secondary | ICD-10-CM | POA: Diagnosis not present

## 2022-06-29 DIAGNOSIS — I10 Essential (primary) hypertension: Secondary | ICD-10-CM | POA: Diagnosis not present

## 2022-06-29 DIAGNOSIS — D75839 Thrombocytosis, unspecified: Secondary | ICD-10-CM | POA: Diagnosis not present

## 2022-06-29 DIAGNOSIS — D469 Myelodysplastic syndrome, unspecified: Secondary | ICD-10-CM

## 2022-06-29 DIAGNOSIS — Z886 Allergy status to analgesic agent status: Secondary | ICD-10-CM | POA: Diagnosis not present

## 2022-06-29 DIAGNOSIS — D461 Refractory anemia with ring sideroblasts: Secondary | ICD-10-CM | POA: Diagnosis not present

## 2022-06-29 DIAGNOSIS — Z882 Allergy status to sulfonamides status: Secondary | ICD-10-CM | POA: Diagnosis not present

## 2022-06-29 DIAGNOSIS — R7989 Other specified abnormal findings of blood chemistry: Secondary | ICD-10-CM | POA: Diagnosis not present

## 2022-06-29 LAB — CBC WITH DIFFERENTIAL/PLATELET
Abs Immature Granulocytes: 0.01 10*3/uL (ref 0.00–0.07)
Basophils Absolute: 0.1 10*3/uL (ref 0.0–0.1)
Basophils Relative: 1 %
Eosinophils Absolute: 0.2 10*3/uL (ref 0.0–0.5)
Eosinophils Relative: 3 %
HCT: 37.1 % — ABNORMAL LOW (ref 39.0–52.0)
Hemoglobin: 11.9 g/dL — ABNORMAL LOW (ref 13.0–17.0)
Immature Granulocytes: 0 %
Lymphocytes Relative: 45 %
Lymphs Abs: 2.2 10*3/uL (ref 0.7–4.0)
MCH: 32.1 pg (ref 26.0–34.0)
MCHC: 32.1 g/dL (ref 30.0–36.0)
MCV: 100 fL (ref 80.0–100.0)
Monocytes Absolute: 0.5 10*3/uL (ref 0.1–1.0)
Monocytes Relative: 11 %
Neutro Abs: 2 10*3/uL (ref 1.7–7.7)
Neutrophils Relative %: 40 %
Platelets: 433 10*3/uL — ABNORMAL HIGH (ref 150–400)
RBC: 3.71 MIL/uL — ABNORMAL LOW (ref 4.22–5.81)
RDW: 28.8 % — ABNORMAL HIGH (ref 11.5–15.5)
WBC: 4.9 10*3/uL (ref 4.0–10.5)
nRBC: 1 % — ABNORMAL HIGH (ref 0.0–0.2)

## 2022-06-29 LAB — SAMPLE TO BLOOD BANK

## 2022-06-29 NOTE — Progress Notes (Signed)
No injection needed today for Hemoglobin of 11.9, patient made aware and discharged in satisfactory condition.

## 2022-07-01 ENCOUNTER — Other Ambulatory Visit: Payer: Self-pay

## 2022-07-06 ENCOUNTER — Inpatient Hospital Stay: Payer: 59

## 2022-07-06 ENCOUNTER — Inpatient Hospital Stay (HOSPITAL_BASED_OUTPATIENT_CLINIC_OR_DEPARTMENT_OTHER): Payer: 59 | Admitting: Hematology

## 2022-07-06 VITALS — BP 127/95 | HR 92 | Temp 98.1°F | Resp 18 | Ht 67.0 in | Wt 232.1 lb

## 2022-07-06 DIAGNOSIS — Z79899 Other long term (current) drug therapy: Secondary | ICD-10-CM | POA: Diagnosis not present

## 2022-07-06 DIAGNOSIS — D469 Myelodysplastic syndrome, unspecified: Secondary | ICD-10-CM

## 2022-07-06 DIAGNOSIS — Z882 Allergy status to sulfonamides status: Secondary | ICD-10-CM | POA: Diagnosis not present

## 2022-07-06 DIAGNOSIS — D461 Refractory anemia with ring sideroblasts: Secondary | ICD-10-CM | POA: Diagnosis not present

## 2022-07-06 DIAGNOSIS — R7989 Other specified abnormal findings of blood chemistry: Secondary | ICD-10-CM | POA: Diagnosis not present

## 2022-07-06 DIAGNOSIS — I10 Essential (primary) hypertension: Secondary | ICD-10-CM | POA: Diagnosis not present

## 2022-07-06 DIAGNOSIS — Z886 Allergy status to analgesic agent status: Secondary | ICD-10-CM | POA: Diagnosis not present

## 2022-07-06 DIAGNOSIS — D75839 Thrombocytosis, unspecified: Secondary | ICD-10-CM | POA: Diagnosis not present

## 2022-07-06 LAB — CBC
HCT: 35.2 % — ABNORMAL LOW (ref 39.0–52.0)
Hemoglobin: 11.6 g/dL — ABNORMAL LOW (ref 13.0–17.0)
MCH: 32.1 pg (ref 26.0–34.0)
MCHC: 33 g/dL (ref 30.0–36.0)
MCV: 97.5 fL (ref 80.0–100.0)
Platelets: 423 10*3/uL — ABNORMAL HIGH (ref 150–400)
RBC: 3.61 MIL/uL — ABNORMAL LOW (ref 4.22–5.81)
RDW: 27.9 % — ABNORMAL HIGH (ref 11.5–15.5)
WBC: 5.1 10*3/uL (ref 4.0–10.5)
nRBC: 0 % (ref 0.0–0.2)

## 2022-07-06 LAB — SAMPLE TO BLOOD BANK

## 2022-07-06 MED ORDER — LUSPATERCEPT-AAMT 75 MG ~~LOC~~ SOLR
0.9500 mg/kg | Freq: Once | SUBCUTANEOUS | Status: AC
  Administered 2022-07-06: 100 mg via SUBCUTANEOUS
  Filled 2022-07-06: qty 1.5

## 2022-07-06 NOTE — Patient Instructions (Signed)
[facility]  Discharge Instructions: Thank you for choosing [facility] to provide your oncology and hematology care.  If you have a lab appointment with the [facility], please come in thru the Main Entrance and check in at the main information desk.  Wear comfortable clothing and clothing appropriate for easy access to any Portacath or PICC line.   We strive to give you quality time with your provider. You may need to reschedule your appointment if you arrive late (15 or more minutes).  Arriving late affects you and other patients whose appointments are after yours.  Also, if you miss three or more appointments without notifying the office, you may be dismissed from the clinic at the provider's discretion.      For prescription refill requests, have your pharmacy contact our office and allow 72 hours for refills to be completed.    Today you received the following chemotherapy and/or immunotherapy agents Reblozyl.  Luspatercept Injection What is this medication? LUSPATERCEPT (lus PAT er sept) treats low levels of red blood cells (anemia) in the body in people with beta thalassemia or myelodysplastic syndromes. It works by helping the body make more red blood cells. This medicine may be used for other purposes; ask your health care provider or pharmacist if you have questions. COMMON BRAND NAME(S): REBLOZYL What should I tell my care team before I take this medication? They need to know if you have any of these conditions: Have had your spleen removed High blood pressure History of blood clots Tobacco use An unusual or allergic reaction to luspatercept, other medications, foods, dyes or preservatives Pregnant or trying to get pregnant Breast-feeding How should I use this medication? This medication is for injection under the skin. It is given by your care team in a hospital or clinic setting. Talk to your care team about the use of the medication in  children. This medication is not approved for use in children. Overdosage: If you think you have taken too much of this medicine contact a poison control center or emergency room at once. NOTE: This medicine is only for you. Do not share this medicine with others. What if I miss a dose? Keep appointments for follow-up doses. It is important not to miss your dose. Call your care team if you are unable to keep an appointment. What may interact with this medication? Interactions are not expected. This list may not describe all possible interactions. Give your health care provider a list of all the medicines, herbs, non-prescription drugs, or dietary supplements you use. Also tell them if you smoke, drink alcohol, or use illegal drugs. Some items may interact with your medicine. What should I watch for while using this medication? Your condition will be monitored carefully while you are receiving this medication. Talk to your care team if you wish to become pregnant or think you might be pregnant. This medication can cause serious birth defects. Discuss contraceptive options with your care team. Do not breastfeed while taking this medication. You may need blood work done while you are taking this medication. What side effects may I notice from receiving this medication? Side effects that you should report to your care team as soon as possible: Allergic reactions--skin rash, itching, hives, swelling of the face, lips, tongue, or throat Blood clot--pain, swelling, or warmth in the leg, shortness of breath, chest pain Increase in blood pressure Severe back pain, numbness or weakness of the hands, arms, legs, or feet, loss  of coordination, loss of bowel or bladder control Side effects that usually do not require medical attention (report these to your care team if they continue or are bothersome): Bone pain Dizziness Fatigue Headache Joint pain Muscle pain Stomach pain This list may not describe  all possible side effects. Call your doctor for medical advice about side effects. You may report side effects to FDA at 1-800-FDA-1088. Where should I keep my medication? This medication is given in a hospital or clinic. It will not be stored at home. NOTE: This sheet is a summary. It may not cover all possible information. If you have questions about this medicine, talk to your doctor, pharmacist, or health care provider.  2023 Elsevier/Gold Standard (2021-02-20 00:00:00)        To help prevent nausea and vomiting after your treatment, we encourage you to take your nausea medication as directed.  BELOW ARE SYMPTOMS THAT SHOULD BE REPORTED IMMEDIATELY: *FEVER GREATER THAN 100.4 F (38 C) OR HIGHER *CHILLS OR SWEATING *NAUSEA AND VOMITING THAT IS NOT CONTROLLED WITH YOUR NAUSEA MEDICATION *UNUSUAL SHORTNESS OF BREATH *UNUSUAL BRUISING OR BLEEDING *URINARY PROBLEMS (pain or burning when urinating, or frequent urination) *BOWEL PROBLEMS (unusual diarrhea, constipation, pain near the anus) TENDERNESS IN MOUTH AND THROAT WITH OR WITHOUT PRESENCE OF ULCERS (sore throat, sores in mouth, or a toothache) UNUSUAL RASH, SWELLING OR PAIN  UNUSUAL VAGINAL DISCHARGE OR ITCHING   Items with * indicate a potential emergency and should be followed up as soon as possible or go to the Emergency Department if any problems should occur.  Please show the CHEMOTHERAPY ALERT CARD or IMMUNOTHERAPY ALERT CARD at check-in to the Emergency Department and triage nurse.  Should you have questions after your visit or need to cancel or reschedule your appointment, please contact [facility] [phone]  and follow the prompts.  Office hours are 8:00 a.m. to 4:30 p.m. Monday - Friday. Please note that voicemails left after 4:00 p.m. may not be returned until the following business day.  We are closed weekends and major holidays. You have access to a nurse at all times for urgent questions. Please  call the main number to the clinic [phone] and follow the prompts.  For any non-urgent questions, you may also contact your provider using MyChart. We now offer e-Visits for anyone 46 and older to request care online for non-urgent symptoms. For details visit [email or web address].   Also download the MyChart app! Go to the app store, search "MyChart", open the app, select [facility], and log in with your MyChart username and password.  Masks are optional in the cancer centers. If you would like for your care team to wear a mask while they are taking care of you, please let them know. You may have one support person who is at least 63 years old accompany you for your appointments.

## 2022-07-06 NOTE — Patient Instructions (Addendum)
Stephen Watts  Discharge Instructions  You were seen and examined today by Dr. Delton Coombes.  Dr. Delton Coombes will eliminate the weekly Epogen shots and you will get just Luspatercept every 3 weeks from here on out.  Follow-up as scheduled.  Thank you for choosing [facility] to provide your oncology and hematology care.   To afford each patient quality time with our provider, please arrive at least 15 minutes before your scheduled appointment time. You may need to reschedule your appointment if you arrive late (10 or more minutes). Arriving late affects you and other patients whose appointments are after yours.  Also, if you miss three or more appointments without notifying the office, you may be dismissed from the clinic at the provider's discretion.    Again, thank you for choosing [facility].  Our hope is that these requests will decrease the amount of time that you wait before being seen by our physicians.   If you have a lab appointment with the [facility] please come in thru the Main Entrance and check in at the main information desk.           _____________________________________________________________  Should you have questions after your visit to [facility], please contact our office at [phone] and follow the prompts.  Our office hours are 8:00 a.m. to 4:30 p.m. Monday - Thursday and 8:00 a.m. to 2:30 p.m. Friday.  Please note that voicemails left after 4:00 p.m. may not be returned until the following business day.  We are closed weekends and all major holidays.  You do have access to a nurse 24-7, just call the main number to the clinic [phone] and do not press any options, hold on the line and a nurse will answer the phone.    For prescription refill requests, have your pharmacy contact our office and allow 72 hours.    Masks are optional in the cancer centers. If you would like for  your care team to wear a mask while they are taking care of you, please let them know. You may have one support person who is at least 63 years old accompany you for your appointments.

## 2022-07-06 NOTE — Progress Notes (Signed)
[place], [place] [address]   CLINIC:  Medical Oncology/Hematology  PCP:  Danie Chandler, [place] [address] Dr. 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, a 61 y.o. male, seen for follow-up of MDS and anemia.  He received his first dose of Luspatercept on 06/15/2022.  He also received last dose of Retacrit on 06/22/2022.  Denies any bleeding per rectum or melena.  Reports energy levels are low at 40%.  REVIEW OF SYSTEMS:  Review of Systems  Gastrointestinal:  Negative for blood in stool.  Genitourinary:  Negative for frequency and hematuria.   Neurological:  Negative for light-headedness.  Psychiatric/Behavioral:  Negative for sleep disturbance.   All other systems reviewed and are negative.   PAST MEDICAL/SURGICAL HISTORY:  No past medical history on file. No past surgical history on file.  SOCIAL HISTORY:  Social History   Socioeconomic History   Marital status: Married    Spouse name: Not on file   Number of children: Not on file   Years of education: Not on file   Highest education level: Not on file  Occupational History   Not on file  Tobacco Use   Smoking status: Not on file   Smokeless tobacco: Not on file  Substance and Sexual Activity   Alcohol use: Not on file   Drug use: Not on file   Sexual activity: Not on file  Other Topics Concern   Not on file  Social History Narrative   Not on file   Social Determinants of Health   Financial Resource Strain: Not on file  Food Insecurity: Not on file  Transportation Needs: Not on file  Physical Activity: Not on file  Stress: Not on file  Social Connections: Not on file  Intimate Partner Violence: Not on file    FAMILY HISTORY:  No family history on file.  CURRENT MEDICATIONS:   Current Outpatient Medications  Medication Sig Dispense Refill   amLODipine (NORVASC) 5 MG tablet Take 5 mg by mouth daily.     glimepiride (AMARYL) 2 MG tablet Take 2 mg by mouth daily.     hydrochlorothiazide (HYDRODIURIL) 25 MG tablet Take 25 mg by mouth daily.     lisinopril (ZESTRIL) 40 MG tablet Take 40 mg by mouth daily.     lisinopril-hydrochlorothiazide (ZESTORETIC) 20-25 MG tablet Take 1 tablet by mouth daily.     metFORMIN (GLUCOPHAGE) 1000 MG tablet Take 1,000 mg by mouth 2 (two) times daily.     metoprolol succinate (TOPROL-XL) 50 MG 24 hr tablet Take 50 mg by mouth daily.     sildenafil (VIAGRA) 50 MG tablet Take 50 mg by mouth daily as needed.     No current facility-administered medications for this visit.   Facility-Administered Medications Ordered in Other Visits  Medication Dose Route Frequency Provider Last Rate Last Admin   acetaminophen (TYLENOL) 325 MG tablet            loratadine (CLARITIN) 10 MG tablet             ALLERGIES:  Allergies  Allergen Reactions   Ibuprofen Hives   Sulfa Antibiotics Rash    PHYSICAL EXAM:  Performance status (ECOG): 0 - Asymptomatic  Vitals:   07/06/22 1314  BP: (!) 127/95  Pulse: 92  Resp:  18  Temp: 98.1 F (36.7 C)  SpO2: 100%   Wt Readings from Last 3 Encounters:  07/06/22 232 lb 1.6 oz (105.3 kg)  06/15/22 236 lb 3.2 oz (107.1 kg)  06/08/22 235 lb 4.8 oz (106.7 kg)   Physical Exam Vitals reviewed.  Constitutional:      Appearance: Normal appearance. He is obese.  Cardiovascular:     Rate and Rhythm: Normal rate and regular rhythm.     Pulses: Normal pulses.     Heart sounds: Normal heart sounds.  Pulmonary:     Effort: Pulmonary effort is normal.     Breath sounds: Normal breath sounds.  Neurological:     General: No focal deficit present.     Mental Status: He is alert and oriented to person, place, and time.  Psychiatric:        Mood and Affect: Mood normal.        Behavior: Behavior normal.      LABORATORY DATA:  I have reviewed the labs as listed.     Latest Ref Rng & Units 07/06/2022   11:59 AM 06/29/2022   12:29 PM 06/22/2022    1:05 PM  CBC  WBC 4.0 - 10.5 K/uL 5.1  4.9  6.4   Hemoglobin 13.0 - 17.0 g/dL 11.6  11.9  10.0   Hematocrit 39.0 - 52.0 % 35.2  37.1  30.3   Platelets 150 - 400 K/uL 423  433  519       Latest Ref Rng & Units 06/08/2022   11:17 AM 09/11/2021   12:49 PM  CMP  Glucose 70 - 99 mg/dL 181  175   BUN 8 - 23 mg/dL 15  15   Creatinine 0.61 - 1.24 mg/dL 0.97  1.00   Sodium 135 - 145 mmol/L 139  137   Potassium 3.5 - 5.1 mmol/L 3.5  3.7   Chloride 98 - 111 mmol/L 105  103   CO2 22 - 32 mmol/L 26  25   Calcium 8.9 - 10.3 mg/dL 9.3  9.6   Total Protein 6.5 - 8.1 g/dL 7.6  8.2   Total Bilirubin 0.3 - 1.2 mg/dL 1.1  1.0   Alkaline Phos 38 - 126 U/L 73  53   AST 15 - 41 U/L 28  23   ALT 0 - 44 U/L 37  22       Component Value Date/Time   RBC 3.61 (L) 07/06/2022 1159   MCV 97.5 07/06/2022 1159   MCH 32.1 07/06/2022 1159   MCHC 33.0 07/06/2022 1159   RDW 27.9 (H) 07/06/2022 1159   LYMPHSABS 2.2 06/29/2022 1229   MONOABS 0.5 06/29/2022 1229   EOSABS 0.2 06/29/2022 1229   BASOSABS 0.1 06/29/2022 1229    DIAGNOSTIC IMAGING:  I have independently reviewed the scans and discussed with the patient. No results found.   ASSESSMENT:  MDS with ringed sideroblasts and thrombocytosis: - Patient seen for normocytic anemia with CBC on 05/08/2021 showing hemoglobin 8.4, MCV 97 and platelet count 631. - Ferritin was 1454, percent saturation was 85.  Hemochromatosis was negative. - He is taking iron tablet 2 times daily for the last 1 year. - Colonoscopy was reportedly done in [place] 3 years ago, within normal limits. - BMBX (10/08/2021): Hypercellular marrow with erythroid hyperplasia, erythroid dysplasia and ring sideroblasts.  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 -  IPSS M: Score is -0.73 (low risk).  IPSS-R score 2.5.  Median leukemia free survival was 5.9%.  Median overall survival was 6 years.  AML transformation rate was 1.7 %/year. - Ring sideroblasts were more than 15%.  JAK2 V617F and reflex testing and BCR/ABL by FISH were negative for thrombocytosis. - Serum EPO was 107. - Retacrit 30,000 units weekly started on 11/26/2021, dose changed to 40,000 units every 2 weeks on 12/31/2021, dose increased to 60,000 units weekly on 03/01/2022.  Last dose of Retacrit on 06/22/2022.  Luspatercept 1 mg/kg started on 06/15/2022.    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 - Denies any smoking history. - Paternal aunt had pancreatic cancer.  Sister had breast cancer.     PLAN:  MDS with ring sideroblasts and thrombocytosis: - Luspatercept 1 mg/kg started on 06/15/2022. - He has tolerated first dose reasonably well.  His hemoglobin went up from 8.2-11.6 today.  He did not have to receive weekly Retacrit last time on 06/29/2022. - I have recommended discontinuing Retacrit. - His hemoglobin today is 11.6.  However I will still give his Luspatercept dose today as we are completely stopping Retacrit and I do not want him to suddenly drop his hemoglobin. - He will come back in 3 weeks for his next dose of Luspatercept.  If it remains above 11, we will hold his dose at that time and resume it once it reaches below 11 at 0.8 mg/kg dose. - RTC 6 weeks for follow-up with repeat labs.  Will also check ferritin and iron panel at that time.  2.  Elevated ferritin (hemochromatosis negative): - Ferritin has improved from 1200 to 1040. - Percent saturation is 88.  Will recheck at next visit.  3.  Hypertension: - Continue lisinopril, HCTZ and Norvasc.  Blood pressure is 150/95.  It is likely to improve as we are discontinuing Retacrit.  Orders placed this encounter:  No  orders of the defined types were placed in this encounter.    Derek Jack, MD [facility] [phone]

## 2022-07-06 NOTE — Progress Notes (Signed)
Patient tolerated Reblozyl injection with no complaints voiced.  Site clean and dry with no bruising or swelling noted.  No complaints of pain.  Hold Retacrit today.  Hemoglobin today is 11.6.  Discharged with vital signs stable and no signs or symptoms of distress noted.

## 2022-07-07 ENCOUNTER — Other Ambulatory Visit: Payer: Self-pay

## 2022-07-08 ENCOUNTER — Other Ambulatory Visit: Payer: Self-pay

## 2022-07-12 ENCOUNTER — Other Ambulatory Visit: Payer: Self-pay

## 2022-07-13 ENCOUNTER — Other Ambulatory Visit: Payer: Self-pay

## 2022-07-16 ENCOUNTER — Other Ambulatory Visit: Payer: Self-pay

## 2022-07-27 ENCOUNTER — Inpatient Hospital Stay: Payer: 59

## 2022-07-27 ENCOUNTER — Inpatient Hospital Stay: Payer: 59 | Attending: Hematology

## 2022-07-27 DIAGNOSIS — Z79899 Other long term (current) drug therapy: Secondary | ICD-10-CM | POA: Insufficient documentation

## 2022-07-27 DIAGNOSIS — D461 Refractory anemia with ring sideroblasts: Secondary | ICD-10-CM | POA: Insufficient documentation

## 2022-07-27 DIAGNOSIS — D469 Myelodysplastic syndrome, unspecified: Secondary | ICD-10-CM

## 2022-07-27 LAB — CBC
HCT: 36.9 % — ABNORMAL LOW (ref 39.0–52.0)
Hemoglobin: 12 g/dL — ABNORMAL LOW (ref 13.0–17.0)
MCH: 31.7 pg (ref 26.0–34.0)
MCHC: 32.5 g/dL (ref 30.0–36.0)
MCV: 97.4 fL (ref 80.0–100.0)
Platelets: 516 10*3/uL — ABNORMAL HIGH (ref 150–400)
RBC: 3.79 MIL/uL — ABNORMAL LOW (ref 4.22–5.81)
RDW: 27.4 % — ABNORMAL HIGH (ref 11.5–15.5)
WBC: 9.4 10*3/uL (ref 4.0–10.5)
nRBC: 0 % (ref 0.0–0.2)

## 2022-07-27 LAB — SAMPLE TO BLOOD BANK

## 2022-07-27 NOTE — Progress Notes (Signed)
Hemoglobin is 12.0 today. No need for epogen today.

## 2022-07-30 ENCOUNTER — Other Ambulatory Visit: Payer: Self-pay

## 2022-08-17 ENCOUNTER — Inpatient Hospital Stay: Payer: 59 | Attending: Hematology | Admitting: Hematology

## 2022-08-17 ENCOUNTER — Inpatient Hospital Stay: Payer: 59

## 2022-08-17 ENCOUNTER — Encounter: Payer: Self-pay | Admitting: Hematology

## 2022-08-17 DIAGNOSIS — I10 Essential (primary) hypertension: Secondary | ICD-10-CM | POA: Diagnosis not present

## 2022-08-17 DIAGNOSIS — K921 Melena: Secondary | ICD-10-CM | POA: Insufficient documentation

## 2022-08-17 DIAGNOSIS — D469 Myelodysplastic syndrome, unspecified: Secondary | ICD-10-CM | POA: Diagnosis not present

## 2022-08-17 DIAGNOSIS — R7989 Other specified abnormal findings of blood chemistry: Secondary | ICD-10-CM | POA: Diagnosis not present

## 2022-08-17 DIAGNOSIS — Z882 Allergy status to sulfonamides status: Secondary | ICD-10-CM | POA: Insufficient documentation

## 2022-08-17 DIAGNOSIS — Z79899 Other long term (current) drug therapy: Secondary | ICD-10-CM | POA: Diagnosis not present

## 2022-08-17 DIAGNOSIS — D461 Refractory anemia with ring sideroblasts: Secondary | ICD-10-CM | POA: Insufficient documentation

## 2022-08-17 DIAGNOSIS — Z886 Allergy status to analgesic agent status: Secondary | ICD-10-CM | POA: Insufficient documentation

## 2022-08-17 LAB — CBC
HCT: 29.7 % — ABNORMAL LOW (ref 39.0–52.0)
Hemoglobin: 9.8 g/dL — ABNORMAL LOW (ref 13.0–17.0)
MCH: 31.4 pg (ref 26.0–34.0)
MCHC: 33 g/dL (ref 30.0–36.0)
MCV: 95.2 fL (ref 80.0–100.0)
Platelets: 556 10*3/uL — ABNORMAL HIGH (ref 150–400)
RBC: 3.12 MIL/uL — ABNORMAL LOW (ref 4.22–5.81)
RDW: 27.2 % — ABNORMAL HIGH (ref 11.5–15.5)
WBC: 14.7 10*3/uL — ABNORMAL HIGH (ref 4.0–10.5)
nRBC: 0.1 % (ref 0.0–0.2)

## 2022-08-17 LAB — SAMPLE TO BLOOD BANK

## 2022-08-17 MED ORDER — LUSPATERCEPT-AAMT 75 MG ~~LOC~~ SOLR
0.9500 mg/kg | Freq: Once | SUBCUTANEOUS | Status: AC
  Administered 2022-08-17: 100 mg via SUBCUTANEOUS
  Filled 2022-08-17: qty 0.5

## 2022-08-17 NOTE — Progress Notes (Signed)
[place], [place] [address]   CLINIC:  Medical Oncology/Hematology  PCP:  Danie Chandler, [place] [address] Dr. 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, a 62 y.o. male, seen for follow-up of MDS and anemia.  He reports dark stools from last visit but denies any bleeding per rectum.  Reports energy levels are 100%.  Last Luspatercept was on 07/06/2022.  REVIEW OF SYSTEMS:  Review of Systems  Gastrointestinal:  Negative for blood in stool.  Genitourinary:  Negative for frequency and hematuria.   Neurological:  Negative for light-headedness.  Psychiatric/Behavioral:  Negative for sleep disturbance.   All other systems reviewed and are negative.   PAST MEDICAL/SURGICAL HISTORY:  History reviewed. No pertinent past medical history. History reviewed. No pertinent surgical history.  SOCIAL HISTORY:  Social History   Socioeconomic History   Marital status: Married    Spouse name: Not on file   Number of children: Not on file   Years of education: Not on file   Highest education level: Not on file  Occupational History   Not on file  Tobacco Use   Smoking status: Not on file   Smokeless tobacco: Not on file  Substance and Sexual Activity   Alcohol use: Not on file   Drug use: Not on file   Sexual activity: Not on file  Other Topics Concern   Not on file  Social History Narrative   Not on file   Social Determinants of Health   Financial Resource Strain: Not on file  Food Insecurity: Not on file  Transportation Needs: Not on file  Physical Activity: Not on file  Stress: Not on file  Social Connections: Not on file  Intimate Partner Violence: Not on file    FAMILY HISTORY:  History reviewed. No pertinent family history.  CURRENT MEDICATIONS:   Current Outpatient Medications  Medication Sig Dispense Refill   amLODipine (NORVASC) 5 MG tablet Take 5 mg by mouth daily.     glimepiride (AMARYL) 2 MG tablet Take 2 mg by mouth daily.     hydrochlorothiazide (HYDRODIURIL) 25 MG tablet Take 25 mg by mouth daily.     lisinopril (ZESTRIL) 40 MG tablet Take 40 mg by mouth daily.     lisinopril-hydrochlorothiazide (ZESTORETIC) 20-25 MG tablet Take 1 tablet by mouth daily.     metFORMIN (GLUCOPHAGE) 1000 MG tablet Take 1,000 mg by mouth 2 (two) times daily.     metoprolol succinate (TOPROL-XL) 50 MG 24 hr tablet Take 50 mg by mouth daily.     sildenafil (VIAGRA) 50 MG tablet Take 50 mg by mouth daily as needed.     No current facility-administered medications for this visit.   Facility-Administered Medications Ordered in Other Visits  Medication Dose Route Frequency Provider Last Rate Last Admin   acetaminophen (TYLENOL) 325 MG tablet            loratadine (CLARITIN) 10 MG tablet             ALLERGIES:  Allergies  Allergen Reactions   Ibuprofen Hives   Sulfa Antibiotics Rash    PHYSICAL EXAM:  Performance status (ECOG): 0 - Asymptomatic  Vitals:   08/17/22 1348  BP: (!) 159/79  Pulse: 94  Resp: 18  Temp: 98 F (36.7 C)  SpO2: 97%   Wt Readings from Last 3 Encounters:  08/17/22 237 lb 6.4 oz (107.7 kg)  07/06/22 232 lb 1.6 oz (105.3 kg)  06/15/22 236 lb 3.2 oz (107.1 kg)   Physical Exam Vitals reviewed.  Constitutional:      Appearance: Normal appearance. He is obese.  Cardiovascular:     Rate and Rhythm: Normal rate and regular rhythm.     Pulses: Normal pulses.     Heart sounds: Normal heart sounds.  Pulmonary:     Effort: Pulmonary effort is normal.     Breath sounds: Normal breath sounds.  Neurological:     General: No focal deficit present.     Mental Status: He is alert and oriented to person, place, and time.  Psychiatric:        Mood and Affect: Mood normal.        Behavior: Behavior normal.     LABORATORY DATA:  I have reviewed the labs as listed.     Latest Ref Rng & Units 08/17/2022    1:33 PM 07/27/2022    1:17 PM 07/06/2022   11:59 AM  CBC  WBC 4.0 - 10.5 K/uL 14.7  9.4  5.1   Hemoglobin 13.0 - 17.0 g/dL 9.8  12.0  11.6   Hematocrit 39.0 - 52.0 % 29.7  36.9  35.2   Platelets 150 - 400 K/uL 556  516  423       Latest Ref Rng & Units 06/08/2022   11:17 AM 09/11/2021   12:49 PM  CMP  Glucose 70 - 99 mg/dL 181  175   BUN 8 - 23 mg/dL 15  15   Creatinine 0.61 - 1.24 mg/dL 0.97  1.00   Sodium 135 - 145 mmol/L 139  137   Potassium 3.5 - 5.1 mmol/L 3.5  3.7   Chloride 98 - 111 mmol/L 105  103   CO2 22 - 32 mmol/L 26  25   Calcium 8.9 - 10.3 mg/dL 9.3  9.6   Total Protein 6.5 - 8.1 g/dL 7.6  8.2   Total Bilirubin 0.3 - 1.2 mg/dL 1.1  1.0   Alkaline Phos 38 - 126 U/L 73  53   AST 15 - 41 U/L 28  23   ALT 0 - 44 U/L 37  22       Component Value Date/Time   RBC 3.12 (L) 08/17/2022 1333   MCV 95.2 08/17/2022 1333   MCH 31.4 08/17/2022 1333   MCHC 33.0 08/17/2022 1333   RDW 27.2 (H) 08/17/2022 1333   LYMPHSABS 2.2 06/29/2022 1229   MONOABS 0.5 06/29/2022 1229   EOSABS 0.2 06/29/2022 1229   BASOSABS 0.1 06/29/2022 1229    DIAGNOSTIC IMAGING:  I have independently reviewed the scans and discussed with the patient. No results found.   ASSESSMENT:  MDS with ringed sideroblasts and thrombocytosis: - Patient seen for normocytic anemia with CBC on 05/08/2021 showing hemoglobin 8.4, MCV 97 and platelet count 631. - Ferritin was 1454, percent saturation was 85.  Hemochromatosis was negative. - He is taking iron tablet 2 times daily for the last 1 year. - Colonoscopy was reportedly done in [place] 3 years ago, within normal limits. - BMBX (10/08/2021): Hypercellular marrow with erythroid hyperplasia, erythroid dysplasia and ring sideroblasts.  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 - Myeloid NGS  panel: TET2 and SF3B1 mutation positive. - IPSS M: Score is -0.73 (low risk).  IPSS-R score 2.5.  Median leukemia free survival was 5.9%.  Median overall survival was 6 years.  AML transformation rate was 1.7 %/year. - Ring sideroblasts were more than 15%.  JAK2 V617F and reflex testing and BCR/ABL by FISH were negative for thrombocytosis. - Serum EPO was 107. - Retacrit 30,000 units weekly started on 11/26/2021, dose changed to 40,000 units every 2 weeks on 12/31/2021, dose increased to 60,000 units weekly on 03/01/2022.  Last dose of Retacrit on 06/22/2022.  Luspatercept 1 mg/kg started on 06/15/2022.    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 - Denies any smoking history. - Paternal aunt had pancreatic cancer.  Sister had breast cancer.     PLAN:  MDS with ring sideroblasts and thrombocytosis: - Last Luspatercept was on 07/06/2022. - His hemoglobin improved to 12 on 07/27/2022 and was not given Luspatercept. - Today's hemoglobin is down to 9.8. - He will proceed with Luspatercept today and in 3 weeks.  RTC 6 weeks for follow-up. - As he is having dark stools, will check stool for occult blood.  2.  Elevated ferritin (hemochromatosis negative): - Will check his ferritin and iron panel in 6 weeks.  3.  Hypertension: - Continue lisinopril, HCTZ and Norvasc.  Blood pressure is 159/79.  Orders placed this encounter:  No orders of the defined types were placed in this encounter.    Derek Jack, MD [place] [phone]

## 2022-08-17 NOTE — Patient Instructions (Signed)
[facility]  Discharge Instructions: Thank you for choosing [facility] to provide your oncology and hematology care.  If you have a lab appointment with the [facility], please come in thru the Main Entrance and check in at the main information desk.  Wear comfortable clothing and clothing appropriate for easy access to any Portacath or PICC line.   We strive to give you quality time with your provider. You may need to reschedule your appointment if you arrive late (15 or more minutes).  Arriving late affects you and other patients whose appointments are after yours.  Also, if you miss three or more appointments without notifying the office, you may be dismissed from the clinic at the provider's discretion.      For prescription refill requests, have your pharmacy contact our office and allow 72 hours for refills to be completed.    Today you received the following chemotherapy and/or immunotherapy agents Reblozyl.  Luspatercept Injection What is this medication? LUSPATERCEPT (lus PAT er sept) treats low levels of red blood cells (anemia) in the body in people with beta thalassemia or myelodysplastic syndromes. It works by helping the body make more red blood cells. This medicine may be used for other purposes; ask your health care provider or pharmacist if you have questions. COMMON BRAND NAME(S): REBLOZYL What should I tell my care team before I take this medication? They need to know if you have any of these conditions: Have had your spleen removed High blood pressure History of blood clots Tobacco use An unusual or allergic reaction to luspatercept, other medications, foods, dyes or preservatives Pregnant or trying to get pregnant Breast-feeding How should I use this medication? This medication is for injection under the skin. It is given by your care team in a hospital or clinic setting. Talk to your care team about the use of the medication in  children. This medication is not approved for use in children. Overdosage: If you think you have taken too much of this medicine contact a poison control center or emergency room at once. NOTE: This medicine is only for you. Do not share this medicine with others. What if I miss a dose? Keep appointments for follow-up doses. It is important not to miss your dose. Call your care team if you are unable to keep an appointment. What may interact with this medication? Interactions are not expected. This list may not describe all possible interactions. Give your health care provider a list of all the medicines, herbs, non-prescription drugs, or dietary supplements you use. Also tell them if you smoke, drink alcohol, or use illegal drugs. Some items may interact with your medicine. What should I watch for while using this medication? Your condition will be monitored carefully while you are receiving this medication. Talk to your care team if you wish to become pregnant or think you might be pregnant. This medication can cause serious birth defects. Discuss contraceptive options with your care team. Do not breastfeed while taking this medication. You may need blood work done while you are taking this medication. What side effects may I notice from receiving this medication? Side effects that you should report to your care team as soon as possible: Allergic reactions--skin rash, itching, hives, swelling of the face, lips, tongue, or throat Blood clot--pain, swelling, or warmth in the leg, shortness of breath, chest pain Increase in blood pressure Severe back pain, numbness or weakness of the hands, arms, legs, or feet, loss  of coordination, loss of bowel or bladder control Side effects that usually do not require medical attention (report these to your care team if they continue or are bothersome): Bone pain Dizziness Fatigue Headache Joint pain Muscle pain Stomach pain This list may not describe  all possible side effects. Call your doctor for medical advice about side effects. You may report side effects to FDA at 1-800-FDA-1088. Where should I keep my medication? This medication is given in a hospital or clinic. It will not be stored at home. NOTE: This sheet is a summary. It may not cover all possible information. If you have questions about this medicine, talk to your doctor, pharmacist, or health care provider.  2023 Elsevier/Gold Standard (2021-02-20 00:00:00)        To help prevent nausea and vomiting after your treatment, we encourage you to take your nausea medication as directed.  BELOW ARE SYMPTOMS THAT SHOULD BE REPORTED IMMEDIATELY: *FEVER GREATER THAN 100.4 F (38 C) OR HIGHER *CHILLS OR SWEATING *NAUSEA AND VOMITING THAT IS NOT CONTROLLED WITH YOUR NAUSEA MEDICATION *UNUSUAL SHORTNESS OF BREATH *UNUSUAL BRUISING OR BLEEDING *URINARY PROBLEMS (pain or burning when urinating, or frequent urination) *BOWEL PROBLEMS (unusual diarrhea, constipation, pain near the anus) TENDERNESS IN MOUTH AND THROAT WITH OR WITHOUT PRESENCE OF ULCERS (sore throat, sores in mouth, or a toothache) UNUSUAL RASH, SWELLING OR PAIN  UNUSUAL VAGINAL DISCHARGE OR ITCHING   Items with * indicate a potential emergency and should be followed up as soon as possible or go to the Emergency Department if any problems should occur.  Please show the CHEMOTHERAPY ALERT CARD or IMMUNOTHERAPY ALERT CARD at check-in to the Emergency Department and triage nurse.  Should you have questions after your visit or need to cancel or reschedule your appointment, please contact [facility] [phone]  and follow the prompts.  Office hours are 8:00 a.m. to 4:30 p.m. Monday - Friday. Please note that voicemails left after 4:00 p.m. may not be returned until the following business day.  We are closed weekends and major holidays. You have access to a nurse at all times for urgent questions. Please  call the main number to the clinic [phone] and follow the prompts.  For any non-urgent questions, you may also contact your provider using MyChart. We now offer e-Visits for anyone 59 and older to request care online for non-urgent symptoms. For details visit [email or web address].   Also download the MyChart app! Go to the app store, search "MyChart", open the app, select [facility], and log in with your MyChart username and password.

## 2022-08-17 NOTE — Patient Instructions (Addendum)
[facility]  Discharge Instructions  You were seen and examined today by Dr. Delton Coombes.  Dr. Delton Coombes discussed your most recent lab work which revealed that your hemoglobin is 9.8 today.   Dr. Delton Coombes wants you to do stool cards to make sure that you are not passing any blood in your stools since you have been having dark stools.  Follow-up as scheduled in 6 weeks with labs.    Thank you for choosing [facility] to provide your oncology and hematology care.   To afford each patient quality time with our provider, please arrive at least 15 minutes before your scheduled appointment time. You may need to reschedule your appointment if you arrive late (10 or more minutes). Arriving late affects you and other patients whose appointments are after yours.  Also, if you miss three or more appointments without notifying the office, you may be dismissed from the clinic at the provider's discretion.    Again, thank you for choosing [facility]  Our hope is that these requests will decrease the amount of time that you wait before being seen by our physicians.   If you have a lab appointment with the [facility] please come in thru the Main Entrance and check in at the main information desk.           _____________________________________________________________  Should you have questions after your visit to [facility], please contact our office at [phone] and follow the prompts.  Our office hours are 8:00 a.m. to 4:30 p.m. Monday - Thursday and 8:00 a.m. to 2:30 p.m. Friday.  Please note that voicemails left after 4:00 p.m. may not be returned until the following business day.  We are closed weekends and all major holidays.  You do have access to a nurse 24-7, just call the main number to the clinic [phone] and do not press any options, hold on the line and a nurse will answer the phone.    For  prescription refill requests, have your pharmacy contact our office and allow 72 hours.    Masks are optional in the cancer centers. If you would like for your care team to wear a mask while they are taking care of you, please let them know. You may have one support person who is at least 64 years old accompany you for your appointments.

## 2022-08-17 NOTE — Progress Notes (Signed)
Patient tolerated Reblozyl injection with no complaints voiced.  Site clean and dry with no bruising or swelling noted.  No complaints of pain.  Discharged with vital signs stable and no signs or symptoms of distress noted.

## 2022-08-17 NOTE — Progress Notes (Signed)
Patient has been assessed, vital signs and labs have been reviewed by Dr. Katragadda. ANC, Creatinine, LFTs, and Platelets are within treatment parameters per Dr. Katragadda. The patient is good to proceed with treatment at this time. Primary RN and pharmacy aware.  

## 2022-08-18 ENCOUNTER — Other Ambulatory Visit: Payer: Self-pay

## 2022-08-19 ENCOUNTER — Other Ambulatory Visit: Payer: Self-pay

## 2022-08-19 DIAGNOSIS — E119 Type 2 diabetes mellitus without complications: Secondary | ICD-10-CM | POA: Diagnosis not present

## 2022-08-19 DIAGNOSIS — Z23 Encounter for immunization: Secondary | ICD-10-CM | POA: Diagnosis not present

## 2022-08-19 DIAGNOSIS — I10 Essential (primary) hypertension: Secondary | ICD-10-CM | POA: Diagnosis not present

## 2022-08-29 ENCOUNTER — Other Ambulatory Visit: Payer: Self-pay

## 2022-09-09 ENCOUNTER — Ambulatory Visit: Payer: 59

## 2022-09-09 ENCOUNTER — Inpatient Hospital Stay: Payer: 59 | Attending: Hematology

## 2022-09-09 ENCOUNTER — Other Ambulatory Visit: Payer: Self-pay | Admitting: Hematology

## 2022-09-09 ENCOUNTER — Inpatient Hospital Stay: Payer: 59

## 2022-09-09 ENCOUNTER — Ambulatory Visit: Payer: 59 | Admitting: Hematology

## 2022-09-09 ENCOUNTER — Other Ambulatory Visit: Payer: 59

## 2022-09-09 VITALS — BP 147/70 | HR 83 | Temp 98.3°F | Resp 18

## 2022-09-09 DIAGNOSIS — Z803 Family history of malignant neoplasm of breast: Secondary | ICD-10-CM | POA: Insufficient documentation

## 2022-09-09 DIAGNOSIS — R7989 Other specified abnormal findings of blood chemistry: Secondary | ICD-10-CM | POA: Insufficient documentation

## 2022-09-09 DIAGNOSIS — D461 Refractory anemia with ring sideroblasts: Secondary | ICD-10-CM | POA: Insufficient documentation

## 2022-09-09 DIAGNOSIS — Z800 Family history of malignant neoplasm of digestive organs: Secondary | ICD-10-CM | POA: Insufficient documentation

## 2022-09-09 DIAGNOSIS — Z79899 Other long term (current) drug therapy: Secondary | ICD-10-CM | POA: Insufficient documentation

## 2022-09-09 DIAGNOSIS — D75839 Thrombocytosis, unspecified: Secondary | ICD-10-CM | POA: Insufficient documentation

## 2022-09-09 DIAGNOSIS — I10 Essential (primary) hypertension: Secondary | ICD-10-CM | POA: Diagnosis not present

## 2022-09-09 DIAGNOSIS — Z882 Allergy status to sulfonamides status: Secondary | ICD-10-CM | POA: Insufficient documentation

## 2022-09-09 DIAGNOSIS — E669 Obesity, unspecified: Secondary | ICD-10-CM | POA: Insufficient documentation

## 2022-09-09 DIAGNOSIS — Z886 Allergy status to analgesic agent status: Secondary | ICD-10-CM | POA: Diagnosis not present

## 2022-09-09 DIAGNOSIS — D469 Myelodysplastic syndrome, unspecified: Secondary | ICD-10-CM

## 2022-09-09 LAB — OCCULT BLOOD X 1 CARD TO LAB, STOOL
Fecal Occult Bld: NEGATIVE
Fecal Occult Bld: NEGATIVE
Fecal Occult Bld: NEGATIVE

## 2022-09-09 LAB — IRON AND TIBC
Iron: 183 ug/dL — ABNORMAL HIGH (ref 45–182)
Saturation Ratios: 82 % — ABNORMAL HIGH (ref 17.9–39.5)
TIBC: 224 ug/dL — ABNORMAL LOW (ref 250–450)
UIBC: 41 ug/dL

## 2022-09-09 LAB — COMPREHENSIVE METABOLIC PANEL
ALT: 30 U/L (ref 0–44)
AST: 25 U/L (ref 15–41)
Albumin: 4 g/dL (ref 3.5–5.0)
Alkaline Phosphatase: 81 U/L (ref 38–126)
Anion gap: 8 (ref 5–15)
BUN: 17 mg/dL (ref 8–23)
CO2: 26 mmol/L (ref 22–32)
Calcium: 9 mg/dL (ref 8.9–10.3)
Chloride: 103 mmol/L (ref 98–111)
Creatinine, Ser: 0.91 mg/dL (ref 0.61–1.24)
GFR, Estimated: >60 mL/min (ref >60)
Glucose, Bld: 89 mg/dL (ref 70–99)
Potassium: 3.6 mmol/L (ref 3.5–5.1)
Sodium: 137 mmol/L (ref 135–145)
Total Bilirubin: 0.9 mg/dL (ref 0.3–1.2)
Total Protein: 7.9 g/dL (ref 6.5–8.1)

## 2022-09-09 LAB — CBC WITH DIFFERENTIAL/PLATELET
Abs Immature Granulocytes: 0 10*3/uL (ref 0.00–0.07)
Band Neutrophils: 1 %
Basophils Absolute: 0 10*3/uL (ref 0.0–0.1)
Basophils Relative: 0 %
Eosinophils Absolute: 0 10*3/uL (ref 0.0–0.5)
Eosinophils Relative: 0 %
HCT: 34.3 % — ABNORMAL LOW (ref 39.0–52.0)
Hemoglobin: 11.2 g/dL — ABNORMAL LOW (ref 13.0–17.0)
Lymphocytes Relative: 11 %
Lymphs Abs: 0.7 10*3/uL (ref 0.7–4.0)
MCH: 30.9 pg (ref 26.0–34.0)
MCHC: 32.7 g/dL (ref 30.0–36.0)
MCV: 94.5 fL (ref 80.0–100.0)
Monocytes Absolute: 0.3 10*3/uL (ref 0.1–1.0)
Monocytes Relative: 5 %
Neutro Abs: 5 10*3/uL (ref 1.7–7.7)
Neutrophils Relative %: 83 %
Platelets: 436 10*3/uL — ABNORMAL HIGH (ref 150–400)
RBC: 3.63 MIL/uL — ABNORMAL LOW (ref 4.22–5.81)
RDW: 27.5 % — ABNORMAL HIGH (ref 11.5–15.5)
WBC: 6 10*3/uL (ref 4.0–10.5)
nRBC: 0.5 % — ABNORMAL HIGH (ref 0.0–0.2)

## 2022-09-09 LAB — SAMPLE TO BLOOD BANK

## 2022-09-09 LAB — FERRITIN: Ferritin: 1304 ng/mL — ABNORMAL HIGH (ref 24–336)

## 2022-09-09 MED ORDER — LUSPATERCEPT-AAMT 75 MG ~~LOC~~ SOLR
0.9500 mg/kg | Freq: Once | SUBCUTANEOUS | Status: AC
  Administered 2022-09-09: 100 mg via SUBCUTANEOUS
  Filled 2022-09-09: qty 0.5

## 2022-09-09 NOTE — Progress Notes (Signed)
Stephen Watts presents today for Reblozyl injection per the provider's orders.  Stable during  administration without incident; injection site WNL; see MAR for injection details.  Patient tolerated procedure well and without incident.  No questions or complaints noted at this time.

## 2022-09-09 NOTE — Progress Notes (Signed)
Hemoglobin is 11.2 today, please give injection per MD.

## 2022-09-09 NOTE — Patient Instructions (Signed)
[facility]  Discharge Instructions: Thank you for choosing [facility] to provide your oncology and hematology care.  If you have a lab appointment with the [facility], please come in thru the Main Entrance and check in at the main information desk.  Wear comfortable clothing and clothing appropriate for easy access to any Portacath or PICC line.   We strive to give you quality time with your provider. You may need to reschedule your appointment if you arrive late (15 or more minutes).  Arriving late affects you and other patients whose appointments are after yours.  Also, if you miss three or more appointments without notifying the office, you may be dismissed from the clinic at the provider's discretion.      For prescription refill requests, have your pharmacy contact our office and allow 72 hours for refills to be completed.    Today you received the following chemotherapy and/or immunotherapy agents Reblozyl      To help prevent nausea and vomiting after your treatment, we encourage you to take your nausea medication as directed.  BELOW ARE SYMPTOMS THAT SHOULD BE REPORTED IMMEDIATELY: *FEVER GREATER THAN 100.4 F (38 C) OR HIGHER *CHILLS OR SWEATING *NAUSEA AND VOMITING THAT IS NOT CONTROLLED WITH YOUR NAUSEA MEDICATION *UNUSUAL SHORTNESS OF BREATH *UNUSUAL BRUISING OR BLEEDING *URINARY PROBLEMS (pain or burning when urinating, or frequent urination) *BOWEL PROBLEMS (unusual diarrhea, constipation, pain near the anus) TENDERNESS IN MOUTH AND THROAT WITH OR WITHOUT PRESENCE OF ULCERS (sore throat, sores in mouth, or a toothache) UNUSUAL RASH, SWELLING OR PAIN  UNUSUAL VAGINAL DISCHARGE OR ITCHING   Items with * indicate a potential emergency and should be followed up as soon as possible or go to the Emergency Department if any problems should occur.  Please show the CHEMOTHERAPY ALERT CARD or IMMUNOTHERAPY ALERT CARD at check-in to the  Emergency Department and triage nurse.  Should you have questions after your visit or need to cancel or reschedule your appointment, please contact [facility] [phone]  and follow the prompts.  Office hours are 8:00 a.m. to 4:30 p.m. Monday - Friday. Please note that voicemails left after 4:00 p.m. may not be returned until the following business day.  We are closed weekends and major holidays. You have access to a nurse at all times for urgent questions. Please call the main number to the clinic [phone] and follow the prompts.  For any non-urgent questions, you may also contact your provider using MyChart. We now offer e-Visits for anyone 65 and older to request care online for non-urgent symptoms. For details visit [email or web address].   Also download the MyChart app! Go to the app store, search "MyChart", open the app, select [facility], and log in with your MyChart username and password.

## 2022-10-05 ENCOUNTER — Other Ambulatory Visit: Payer: Self-pay

## 2022-10-05 ENCOUNTER — Ambulatory Visit: Payer: 59 | Admitting: Hematology

## 2022-10-05 ENCOUNTER — Other Ambulatory Visit: Payer: 59

## 2022-10-05 ENCOUNTER — Ambulatory Visit: Payer: 59

## 2022-10-05 DIAGNOSIS — D469 Myelodysplastic syndrome, unspecified: Secondary | ICD-10-CM

## 2022-10-06 ENCOUNTER — Inpatient Hospital Stay: Payer: 59

## 2022-10-06 ENCOUNTER — Inpatient Hospital Stay (HOSPITAL_BASED_OUTPATIENT_CLINIC_OR_DEPARTMENT_OTHER): Payer: 59 | Admitting: Hematology

## 2022-10-06 VITALS — BP 132/78 | HR 84 | Temp 98.6°F | Resp 16 | Wt 242.9 lb

## 2022-10-06 DIAGNOSIS — D75839 Thrombocytosis, unspecified: Secondary | ICD-10-CM

## 2022-10-06 DIAGNOSIS — D469 Myelodysplastic syndrome, unspecified: Secondary | ICD-10-CM

## 2022-10-06 DIAGNOSIS — D649 Anemia, unspecified: Secondary | ICD-10-CM

## 2022-10-06 DIAGNOSIS — D461 Refractory anemia with ring sideroblasts: Secondary | ICD-10-CM

## 2022-10-06 LAB — CBC WITH DIFFERENTIAL/PLATELET
Abs Immature Granulocytes: 0 10*3/uL (ref 0.00–0.07)
Band Neutrophils: 2 %
Basophils Absolute: 0 10*3/uL (ref 0.0–0.1)
Basophils Relative: 0 %
Eosinophils Absolute: 0.1 10*3/uL (ref 0.0–0.5)
Eosinophils Relative: 2 %
HCT: 33.6 % — ABNORMAL LOW (ref 39.0–52.0)
Hemoglobin: 11.2 g/dL — ABNORMAL LOW (ref 13.0–17.0)
Lymphocytes Relative: 55 %
Lymphs Abs: 2.4 10*3/uL (ref 0.7–4.0)
MCH: 32 pg (ref 26.0–34.0)
MCHC: 33.3 g/dL (ref 30.0–36.0)
MCV: 96 fL (ref 80.0–100.0)
Monocytes Absolute: 0.5 10*3/uL (ref 0.1–1.0)
Monocytes Relative: 11 %
Neutro Abs: 1.4 10*3/uL — ABNORMAL LOW (ref 1.7–7.7)
Neutrophils Relative %: 30 %
Platelets: 360 10*3/uL (ref 150–400)
RBC: 3.5 MIL/uL — ABNORMAL LOW (ref 4.22–5.81)
RDW: 26.4 % — ABNORMAL HIGH (ref 11.5–15.5)
WBC Morphology: REACTIVE
WBC: 4.4 10*3/uL (ref 4.0–10.5)
nRBC: 0 % (ref 0.0–0.2)

## 2022-10-06 LAB — COMPREHENSIVE METABOLIC PANEL
ALT: 43 U/L (ref 0–44)
AST: 34 U/L (ref 15–41)
Albumin: 4 g/dL (ref 3.5–5.0)
Alkaline Phosphatase: 87 U/L (ref 38–126)
Anion gap: 5 (ref 5–15)
BUN: 15 mg/dL (ref 8–23)
CO2: 26 mmol/L (ref 22–32)
Calcium: 8.7 mg/dL — ABNORMAL LOW (ref 8.9–10.3)
Chloride: 101 mmol/L (ref 98–111)
Creatinine, Ser: 0.88 mg/dL (ref 0.61–1.24)
GFR, Estimated: >60 mL/min (ref >60)
Glucose, Bld: 257 mg/dL — ABNORMAL HIGH (ref 70–99)
Potassium: 3.8 mmol/L (ref 3.5–5.1)
Sodium: 132 mmol/L — ABNORMAL LOW (ref 135–145)
Total Bilirubin: 1.3 mg/dL — ABNORMAL HIGH (ref 0.3–1.2)
Total Protein: 7.8 g/dL (ref 6.5–8.1)

## 2022-10-06 LAB — IRON AND TIBC
Iron: 186 ug/dL — ABNORMAL HIGH (ref 45–182)
Saturation Ratios: 94 % — ABNORMAL HIGH (ref 17.9–39.5)
TIBC: 197 ug/dL — ABNORMAL LOW (ref 250–450)
UIBC: 11 ug/dL

## 2022-10-06 LAB — FERRITIN: Ferritin: 1426 ng/mL — ABNORMAL HIGH (ref 24–336)

## 2022-10-06 LAB — SAMPLE TO BLOOD BANK

## 2022-10-06 MED ORDER — LUSPATERCEPT-AAMT 75 MG ~~LOC~~ SOLR
0.9500 mg/kg | Freq: Once | SUBCUTANEOUS | Status: AC
  Administered 2022-10-06: 100 mg via SUBCUTANEOUS
  Filled 2022-10-06: qty 1.6

## 2022-10-06 NOTE — Progress Notes (Signed)
[name], [place] [address]   CLINIC:  Medical Oncology/Hematology  PCP:  Danie Chandler, [place] [address] Dr. 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, a 62 y.o. male, seen for follow-up of MDS and anemia.  Denies any bleeding per rectum or melena.  He is tolerating Luspatercept very well.  REVIEW OF SYSTEMS:  Review of Systems  Gastrointestinal:  Negative for blood in stool.  Genitourinary:  Negative for frequency and hematuria.   Neurological:  Negative for light-headedness.  Psychiatric/Behavioral:  Negative for sleep disturbance.   All other systems reviewed and are negative.   PAST MEDICAL/SURGICAL HISTORY:  No past medical history on file. No past surgical history on file.  SOCIAL HISTORY:  Social History   Socioeconomic History   Marital status: Married    Spouse name: Not on file   Number of children: Not on file   Years of education: Not on file   Highest education level: Not on file  Occupational History   Not on file  Tobacco Use   Smoking status: Not on file   Smokeless tobacco: Not on file  Substance and Sexual Activity   Alcohol use: Not on file   Drug use: Not on file   Sexual activity: Not on file  Other Topics Concern   Not on file  Social History Narrative   Not on file   Social Determinants of Health   Financial Resource Strain: Not on file  Food Insecurity: Not on file  Transportation Needs: Not on file  Physical Activity: Not on file  Stress: Not on file  Social Connections: Not on file  Intimate Partner Violence: Not on file    FAMILY HISTORY:  No family history on file.  CURRENT MEDICATIONS:  Current Outpatient Medications  Medication Sig Dispense Refill   amLODipine (NORVASC) 5 MG tablet Take 5 mg by  mouth daily.     glimepiride (AMARYL) 2 MG tablet Take 2 mg by mouth daily.     hydrochlorothiazide (HYDRODIURIL) 25 MG tablet Take 25 mg by mouth daily.     lisinopril (ZESTRIL) 40 MG tablet Take 40 mg by mouth daily.     lisinopril-hydrochlorothiazide (ZESTORETIC) 20-25 MG tablet Take 1 tablet by mouth daily.     metFORMIN (GLUCOPHAGE) 1000 MG tablet Take 1,000 mg by mouth 2 (two) times daily.     metoprolol succinate (TOPROL-XL) 50 MG 24 hr tablet Take 50 mg by mouth daily.     sildenafil (VIAGRA) 50 MG tablet Take 50 mg by mouth daily as needed.     No current facility-administered medications for this visit.   Facility-Administered Medications Ordered in Other Visits  Medication Dose Route Frequency Provider Last Rate Last Admin   acetaminophen (TYLENOL) 325 MG tablet            loratadine (CLARITIN) 10 MG tablet             ALLERGIES:  Allergies  Allergen Reactions   Ibuprofen Hives   Sulfa Antibiotics Rash    PHYSICAL EXAM:  Performance status (ECOG): 0 - Asymptomatic  There were no vitals filed for this visit.  Wt Readings from Last 3 Encounters:  08/17/22 237 lb 6.4 oz (107.7 kg)  07/06/22 232 lb 1.6 oz (105.3 kg)  06/15/22 236 lb  3.2 oz (107.1 kg)   Physical Exam Vitals reviewed.  Constitutional:      Appearance: Normal appearance. He is obese.  Cardiovascular:     Rate and Rhythm: Normal rate and regular rhythm.     Pulses: Normal pulses.     Heart sounds: Normal heart sounds.  Pulmonary:     Effort: Pulmonary effort is normal.     Breath sounds: Normal breath sounds.  Neurological:     General: No focal deficit present.     Mental Status: He is alert and oriented to person, place, and time.  Psychiatric:        Mood and Affect: Mood normal.        Behavior: Behavior normal.    LABORATORY DATA:  I have reviewed the labs as listed.     Latest Ref Rng & Units 09/09/2022   11:50 AM 08/17/2022    1:33 PM 07/27/2022    1:17 PM  CBC  WBC 4.0 - 10.5 K/uL  6.0  14.7  9.4   Hemoglobin 13.0 - 17.0 g/dL 11.2  9.8  12.0   Hematocrit 39.0 - 52.0 % 34.3  29.7  36.9   Platelets 150 - 400 K/uL 436  556  516       Latest Ref Rng & Units 09/09/2022   11:50 AM 06/08/2022   11:17 AM 09/11/2021   12:49 PM  CMP  Glucose 70 - 99 mg/dL 89  181  175   BUN 8 - 23 mg/dL '17  15  15   '$ Creatinine 0.61 - 1.24 mg/dL 0.91  0.97  1.00   Sodium 135 - 145 mmol/L 137  139  137   Potassium 3.5 - 5.1 mmol/L 3.6  3.5  3.7   Chloride 98 - 111 mmol/L 103  105  103   CO2 22 - 32 mmol/L '26  26  25   '$ Calcium 8.9 - 10.3 mg/dL 9.0  9.3  9.6   Total Protein 6.5 - 8.1 g/dL 7.9  7.6  8.2   Total Bilirubin 0.3 - 1.2 mg/dL 0.9  1.1  1.0   Alkaline Phos 38 - 126 U/L 81  73  53   AST 15 - 41 U/L '25  28  23   '$ ALT 0 - 44 U/L 30  37  22       Component Value Date/Time   RBC 3.63 (L) 09/09/2022 1150   MCV 94.5 09/09/2022 1150   MCH 30.9 09/09/2022 1150   MCHC 32.7 09/09/2022 1150   RDW 27.5 (H) 09/09/2022 1150   LYMPHSABS 0.7 09/09/2022 1150   MONOABS 0.3 09/09/2022 1150   EOSABS 0.0 09/09/2022 1150   BASOSABS 0.0 09/09/2022 1150    DIAGNOSTIC IMAGING:  I have independently reviewed the scans and discussed with the patient. No results found.   ASSESSMENT:  MDS with ringed sideroblasts and thrombocytosis: - Patient seen for normocytic anemia with CBC on 05/08/2021 showing hemoglobin 8.4, MCV 97 and platelet count 631. - Ferritin was 1454, percent saturation was 85.  Hemochromatosis was negative. - He is taking iron tablet 2 times daily for the last 1 year. - Colonoscopy was reportedly done in [place] 3 years ago, within normal limits. - BMBX (10/08/2021): Hypercellular marrow with erythroid hyperplasia, erythroid dysplasia and ring sideroblasts.  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 - IPSS M: Score is -0.73 (low risk).  IPSS-R score 2.5.   Median leukemia free survival was 5.9%.  Median overall survival was 6 years.  AML transformation rate was 1.7 %/year. - Ring sideroblasts were more than 15%.  JAK2 V617F and reflex testing and BCR/ABL by FISH were negative for thrombocytosis. - Serum EPO was 107. - Retacrit 30,000 units weekly started on 11/26/2021, dose changed to 40,000 units every 2 weeks on 12/31/2021, dose increased to 60,000 units weekly on 03/01/2022.  Last dose of Retacrit on 06/22/2022.  Luspatercept 1 mg/kg started on 06/15/2022.    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 - Denies any smoking history. - Paternal aunt had pancreatic cancer.  Sister had breast cancer.     PLAN:  MDS with ring sideroblasts and thrombocytosis: - Stool for occult blood is negative on 09/09/2022. - Labs from today shows normal LFTs with mildly elevated total bilirubin.  Creatinine is normal.  Hemoglobin is 11.2.  Continue Luspatercept 1 mg/kg dose if hemoglobin is less than 11.5. - RTC 12 weeks for follow-up with repeat labs. - Will make a referral to GI for colonoscopy.  2.  Elevated ferritin (hemochromatosis negative): - Ferritin today is 1426 with percent saturation of 94.  Continue close monitoring.  3.  Hypertension: - Continue lisinopril, HCTZ, Norvasc.  Blood pressure is well-controlled today.  Orders placed this encounter:  No orders of the defined types were placed in this encounter.    Derek Jack, MD [facility] [phone]

## 2022-10-06 NOTE — Patient Instructions (Addendum)
[facility] at [facility] Discharge Instructions   You were seen and examined today by Dr. Delton Coombes.  He reviewed the results of your lab work. Your hemoglobin is 11.2 today. Your iron lab results from today are pending. We will let you know if you need iron once we have those results back. All other results (except your blood sugar which was 257) were normal/stable.   We will proceed with your Reblozyl injection today.   We will refer you to a gastroenterologist here in [place] for colonoscopy.   Return as scheduled.    Thank you for choosing [facility] at [facility] to provide your oncology and hematology care.  To afford each patient quality time with our provider, please arrive at least 15 minutes before your scheduled appointment time.   If you have a lab appointment with the [facility] please come in thru the Main Entrance and check in at the main information desk.  You need to re-schedule your appointment should you arrive 10 or more minutes late.  We strive to give you quality time with our providers, and arriving late affects you and other patients whose appointments are after yours.  Also, if you no show three or more times for appointments you may be dismissed from the clinic at the providers discretion.     Again, thank you for choosing [facility]  Our hope is that these requests will decrease the amount of time that you wait before being seen by our physicians.       _____________________________________________________________  Should you have questions after your visit to [facility], please contact our office at [phone] and follow the prompts.  Our office hours are 8:00 a.m. and 4:30 p.m. Monday - Friday.  Please note that voicemails left after 4:00 p.m. may not be returned until the following business day.  We are closed weekends and major holidays.  You do have access to a nurse 24-7,  just call the main number to the clinic [phone] and do not press any options, hold on the line and a nurse will answer the phone.    For prescription refill requests, have your pharmacy contact our office and allow 72 hours.    Due to Covid, you will need to wear a mask upon entering the hospital. If you do not have a mask, a mask will be given to you at the Main Entrance upon arrival. For doctor visits, patients may have 1 support person age 26 or older with them. For treatment visits, patients can not have anyone with them due to social distancing guidelines and our immunocompromised population.

## 2022-10-06 NOTE — Patient Instructions (Signed)
[facility]  Discharge Instructions: Thank you for choosing [facility] to provide your oncology and hematology care.  If you have a lab appointment with the [facility], please come in thru the Main Entrance and check in at the main information desk.  Wear comfortable clothing and clothing appropriate for easy access to any Portacath or PICC line.   We strive to give you quality time with your provider. You may need to reschedule your appointment if you arrive late (15 or more minutes).  Arriving late affects you and other patients whose appointments are after yours.  Also, if you miss three or more appointments without notifying the office, you may be dismissed from the clinic at the provider's discretion.      For prescription refill requests, have your pharmacy contact our office and allow 72 hours for refills to be completed.    Today you received the following Rebolozyl, return as scheduled.   To help prevent nausea and vomiting after your treatment, we encourage you to take your nausea medication as directed.  BELOW ARE SYMPTOMS THAT SHOULD BE REPORTED IMMEDIATELY: *FEVER GREATER THAN 100.4 F (38 C) OR HIGHER *CHILLS OR SWEATING *NAUSEA AND VOMITING THAT IS NOT CONTROLLED WITH YOUR NAUSEA MEDICATION *UNUSUAL SHORTNESS OF BREATH *UNUSUAL BRUISING OR BLEEDING *URINARY PROBLEMS (pain or burning when urinating, or frequent urination) *BOWEL PROBLEMS (unusual diarrhea, constipation, pain near the anus) TENDERNESS IN MOUTH AND THROAT WITH OR WITHOUT PRESENCE OF ULCERS (sore throat, sores in mouth, or a toothache) UNUSUAL RASH, SWELLING OR PAIN  UNUSUAL VAGINAL DISCHARGE OR ITCHING   Items with * indicate a potential emergency and should be followed up as soon as possible or go to the Emergency Department if any problems should occur.  Please show the CHEMOTHERAPY ALERT CARD or IMMUNOTHERAPY ALERT CARD at check-in to the Emergency Department and  triage nurse.  Should you have questions after your visit or need to cancel or reschedule your appointment, please contact [facility] [phone]  and follow the prompts.  Office hours are 8:00 a.m. to 4:30 p.m. Monday - Friday. Please note that voicemails left after 4:00 p.m. may not be returned until the following business day.  We are closed weekends and major holidays. You have access to a nurse at all times for urgent questions. Please call the main number to the clinic [phone] and follow the prompts.  For any non-urgent questions, you may also contact your provider using MyChart. We now offer e-Visits for anyone 35 and older to request care online for non-urgent symptoms. For details visit [email or web address].   Also download the MyChart app! Go to the app store, search "MyChart", open the app, select [facility], and log in with your MyChart username and password.

## 2022-10-06 NOTE — Progress Notes (Signed)
Patient presents today for Reblozyl injection. Per Dr. Delton Coombes patient will receive injection. Hemoglobin reviewed prior to administration. VSS tolerated without incident or complaint. See MAR for details. Patient stable during and after injection. Patient discharged in satisfactory condition with no s/s of distress noted.

## 2022-10-07 ENCOUNTER — Other Ambulatory Visit: Payer: Self-pay

## 2022-10-07 ENCOUNTER — Encounter: Payer: Self-pay | Admitting: Internal Medicine

## 2022-10-27 ENCOUNTER — Inpatient Hospital Stay: Payer: 59

## 2022-10-27 ENCOUNTER — Inpatient Hospital Stay: Payer: 59 | Attending: Hematology

## 2022-10-27 DIAGNOSIS — D469 Myelodysplastic syndrome, unspecified: Secondary | ICD-10-CM

## 2022-10-27 DIAGNOSIS — D649 Anemia, unspecified: Secondary | ICD-10-CM

## 2022-10-27 DIAGNOSIS — D461 Refractory anemia with ring sideroblasts: Secondary | ICD-10-CM | POA: Insufficient documentation

## 2022-10-27 LAB — CBC
HCT: 39.2 % (ref 39.0–52.0)
Hemoglobin: 12.7 g/dL — ABNORMAL LOW (ref 13.0–17.0)
MCH: 31.6 pg (ref 26.0–34.0)
MCHC: 32.4 g/dL (ref 30.0–36.0)
MCV: 97.5 fL (ref 80.0–100.0)
Platelets: 389 10*3/uL (ref 150–400)
RBC: 4.02 MIL/uL — ABNORMAL LOW (ref 4.22–5.81)
RDW: 25.4 % — ABNORMAL HIGH (ref 11.5–15.5)
WBC: 4.3 10*3/uL (ref 4.0–10.5)
nRBC: 0 % (ref 0.0–0.2)

## 2022-10-27 NOTE — Progress Notes (Signed)
Per Dr. Delton Coombes hold Reblozyl today for HGB of 12.7.

## 2022-11-03 ENCOUNTER — Encounter: Payer: Self-pay | Admitting: Internal Medicine

## 2022-11-03 ENCOUNTER — Ambulatory Visit (INDEPENDENT_AMBULATORY_CARE_PROVIDER_SITE_OTHER): Payer: 59 | Admitting: Internal Medicine

## 2022-11-03 ENCOUNTER — Telehealth (INDEPENDENT_AMBULATORY_CARE_PROVIDER_SITE_OTHER): Payer: Self-pay | Admitting: Internal Medicine

## 2022-11-03 VITALS — BP 130/73 | HR 91 | Temp 98.2°F | Ht 67.0 in | Wt 240.9 lb

## 2022-11-03 DIAGNOSIS — Z1211 Encounter for screening for malignant neoplasm of colon: Secondary | ICD-10-CM

## 2022-11-03 MED ORDER — PEG 3350-KCL-NA BICARB-NACL 420 G PO SOLR: 4000.0000 mL | Freq: Once | ORAL | 0 refills | Status: AC

## 2022-11-03 NOTE — Telephone Encounter (Signed)
PA through [employer]-no PA required via [employer].  (Tried through [employer] but it did not [employer])

## 2022-11-03 NOTE — Progress Notes (Signed)
Primary Care Physician:  Danie Chandler, PA Primary Gastroenterologist:  Dr. Abbey Chatters  Chief complaint: Colon cancer screening  HPI:   Stephen Watts is a 62 y.o. male who presents to clinic today by referral from his oncologist Dr. Delton Coombes to discuss colonoscopy.  Patient states his last colonoscopy was 9 or 10 years ago.  This was done in [place], I do not have access to report.  Denies any history of polyps.  No family history of colorectal malignancy.  No melena hematochezia.  Does occasionally have dark stools related to chronic iron therapy.  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  No abdominal pain.  No unintentional weight loss.  Recent Hemoccult testing negative.  Denies any upper GI symptoms including heartburn, reflux, dysphagia/odynophagia.  Overall feels well from a GI standpoint.  Past Medical History:  Diagnosis Date   Anemia    Diabetes ([place])    Hypertension     Past Surgical History:  Procedure Laterality Date   APPENDECTOMY     age 55 years old    Current Outpatient Medications  Medication Sig Dispense Refill   amLODipine (NORVASC) 5 MG tablet Take 5 mg by mouth daily.     ferrous sulfate 325 (65 FE) MG tablet Take 325 mg by mouth. Take one about twice a week.     glimepiride (AMARYL) 2 MG tablet Take 2 mg by mouth daily.     hydrochlorothiazide (HYDRODIURIL) 25 MG tablet Take 25 mg by mouth daily.     lisinopril (ZESTRIL) 40 MG tablet Take 40 mg by mouth daily.     lisinopril-hydrochlorothiazide (ZESTORETIC) 20-25 MG tablet Take 1 tablet by mouth daily.     metFORMIN (GLUCOPHAGE) 1000 MG tablet Take 1,000 mg by mouth 2 (two) times daily.     metoprolol succinate (TOPROL-XL) 50 MG 24 hr tablet Take 50 mg by mouth daily.     sildenafil (VIAGRA) 50 MG tablet Take 50 mg by mouth daily as needed.     sitaGLIPtin (JANUVIA) 100 MG tablet Take 1 tablet by mouth daily.     No current facility-administered medications for this visit.   Facility-Administered  Medications Ordered in Other Visits  Medication Dose Route Frequency Provider Last Rate Last Admin   acetaminophen (TYLENOL) 325 MG tablet            loratadine (CLARITIN) 10 MG tablet             Allergies as of 11/03/2022 - Review Complete 11/03/2022  Allergen Reaction Noted   Ibuprofen Hives 09/11/2021   Sulfa antibiotics Rash 09/11/2021    Family History  Problem Relation Age of Onset   Heart disease Mother    Diabetes Sister     Social History   Socioeconomic History   Marital status: Widowed    Spouse name: Not on file   Number of children: Not on file   Years of education: Not on file   Highest education level: Not on file  Occupational History   Not on file  Tobacco Use   Smoking status: Never    Passive exposure: Never   Smokeless tobacco: Never  Substance and Sexual Activity   Alcohol use: Never   Drug use: Never   Sexual activity: Not on file  Other Topics Concern   Not on file  Social History Narrative   Not on file   Social Determinants of Health   Financial Resource Strain: Not on file  Food Insecurity: Not on file  Transportation Needs:  Not on file  Physical Activity: Not on file  Stress: Not on file  Social Connections: Not on file  Intimate Partner Violence: Not on file    Subjective: Review of Systems  Constitutional:  Negative for chills and fever.  HENT:  Negative for congestion and hearing loss.   Eyes:  Negative for blurred vision and double vision.  Respiratory:  Negative for cough and shortness of breath.   Cardiovascular:  Negative for chest pain and palpitations.  Gastrointestinal:  Negative for abdominal pain, blood in stool, constipation, diarrhea, heartburn, melena and vomiting.  Genitourinary:  Negative for dysuria and urgency.  Musculoskeletal:  Negative for joint pain and myalgias.  Skin:  Negative for itching and rash.  Neurological:  Negative for dizziness and headaches.  Psychiatric/Behavioral:  Negative for  depression. The patient is not nervous/anxious.        Objective: BP 130/73 (BP Location: Left Arm, Patient Position: Sitting, Cuff Size: Large)   Pulse 91   Temp 98.2 F (36.8 C) (Oral)   Ht 5\' 7"  (1.702 m)   Wt 240 lb 14.4 oz (109.3 kg)   BMI 37.73 kg/m  Physical Exam Constitutional:      Appearance: Normal appearance.  HENT:     Head: Normocephalic and atraumatic.  Eyes:     Extraocular Movements: Extraocular movements intact.     Conjunctiva/sclera: Conjunctivae normal.  Cardiovascular:     Rate and Rhythm: Normal rate and regular rhythm.  Pulmonary:     Effort: Pulmonary effort is normal.     Breath sounds: Normal breath sounds.  Abdominal:     General: Bowel sounds are normal.     Palpations: Abdomen is soft.  Musculoskeletal:        General: Normal range of motion.     Cervical back: Normal range of motion and neck supple.  Skin:    General: Skin is warm.  Neurological:     General: No focal deficit present.     Mental Status: He is alert and oriented to person, place, and time.  Psychiatric:        Mood and Affect: Mood normal.        Behavior: Behavior normal.      Assessment: *Colon cancer screening  Plan: Will schedule for screening colonoscopy.The risks including infection, bleed, or perforation as well as benefits, limitations, alternatives and imponderables have been reviewed with the patient. Questions have been answered. All parties agreeable.  Thank you Dr. Delton Coombes for the kind referral.  11/03/2022 1:25 PM   Disclaimer: This note was dictated with voice recognition software. Similar sounding words can inadvertently be transcribed and may not be corrected upon review.

## 2022-11-03 NOTE — Progress Notes (Deleted)
Primary Care Physician:  Danie Chandler, PA Primary Gastroenterologist:  Dr. Abbey Chatters  Chief Complaint  Patient presents with   dark stools    Referred for dark stools. Taking iron twice a week. Having a lot of fatigue.  Last colonoscopy pt states about 8 or 9 years ago in [place].     HPI:   Stephen Watts is a 62 y.o. male who presents   Past Medical History:  Diagnosis Date   Anemia    Diabetes ([place])    Hypertension     Past Surgical History:  Procedure Laterality Date   APPENDECTOMY     age 37 years old    Current Outpatient Medications  Medication Sig Dispense Refill   amLODipine (NORVASC) 5 MG tablet Take 5 mg by mouth daily.     ferrous sulfate 325 (65 FE) MG tablet Take 325 mg by mouth. Take one about twice a week.     glimepiride (AMARYL) 2 MG tablet Take 2 mg by mouth daily.     hydrochlorothiazide (HYDRODIURIL) 25 MG tablet Take 25 mg by mouth daily.     lisinopril (ZESTRIL) 40 MG tablet Take 40 mg by mouth daily.     lisinopril-hydrochlorothiazide (ZESTORETIC) 20-25 MG tablet Take 1 tablet by mouth daily.     metFORMIN (GLUCOPHAGE) 1000 MG tablet Take 1,000 mg by mouth 2 (two) times daily.     metoprolol succinate (TOPROL-XL) 50 MG 24 hr tablet Take 50 mg by mouth daily.     sildenafil (VIAGRA) 50 MG tablet Take 50 mg by mouth daily as needed.     sitaGLIPtin (JANUVIA) 100 MG tablet Take 1 tablet by mouth daily.     No current facility-administered medications for this visit.   Facility-Administered Medications Ordered in Other Visits  Medication Dose Route Frequency Provider Last Rate Last Admin   acetaminophen (TYLENOL) 325 MG tablet            loratadine (CLARITIN) 10 MG tablet             Allergies as of 11/03/2022 - Review Complete 11/03/2022  Allergen Reaction Noted   Ibuprofen Hives 09/11/2021   Sulfa antibiotics Rash 09/11/2021    Family History  Problem Relation Age of Onset   Heart disease Mother    Diabetes Sister     Social  History   Socioeconomic History   Marital status: Widowed    Spouse name: Not on file   Number of children: Not on file   Years of education: Not on file   Highest education level: Not on file  Occupational History   Not on file  Tobacco Use   Smoking status: Never    Passive exposure: Never   Smokeless tobacco: Never  Substance and Sexual Activity   Alcohol use: Never   Drug use: Never   Sexual activity: Not on file  Other Topics Concern   Not on file  Social History Narrative   Not on file   Social Determinants of Health   Financial Resource Strain: Not on file  Food Insecurity: Not on file  Transportation Needs: Not on file  Physical Activity: Not on file  Stress: Not on file  Social Connections: Not on file  Intimate Partner Violence: Not on file    Subjective: ROS     Objective: BP 130/73 (BP Location: Left Arm, Patient Position: Sitting, Cuff Size: Large)   Pulse 91   Temp 98.2 F (36.8 C) (Oral)   Ht 5\' 7"  (  1.702 m)   Wt 240 lb 14.4 oz (109.3 kg)   BMI 37.73 kg/m  Physical Exam   Assessment: *  Plan:   11/03/2022 12:00 PM   Disclaimer: This note was dictated with voice recognition software. Similar sounding words can inadvertently be transcribed and may not be corrected upon review.

## 2022-11-03 NOTE — Patient Instructions (Signed)
We will schedule you for colonoscopy today for colon cancer screening purposes as well as to evaluate your anemia.   It was very nice meeting you today.  Dr. Abbey Chatters

## 2022-11-03 NOTE — Addendum Note (Signed)
Addended by: Vicente Males on: 11/03/2022 01:40 PM   Modules accepted: Orders

## 2022-11-12 ENCOUNTER — Other Ambulatory Visit: Payer: Self-pay

## 2022-11-17 ENCOUNTER — Telehealth (INDEPENDENT_AMBULATORY_CARE_PROVIDER_SITE_OTHER): Payer: Self-pay | Admitting: Internal Medicine

## 2022-11-17 ENCOUNTER — Inpatient Hospital Stay: Payer: 59 | Attending: Hematology

## 2022-11-17 ENCOUNTER — Inpatient Hospital Stay: Payer: 59

## 2022-11-17 VITALS — BP 138/71 | HR 78 | Temp 96.7°F | Resp 20

## 2022-11-17 DIAGNOSIS — D461 Refractory anemia with ring sideroblasts: Secondary | ICD-10-CM | POA: Insufficient documentation

## 2022-11-17 DIAGNOSIS — Z79899 Other long term (current) drug therapy: Secondary | ICD-10-CM | POA: Diagnosis not present

## 2022-11-17 DIAGNOSIS — D469 Myelodysplastic syndrome, unspecified: Secondary | ICD-10-CM

## 2022-11-17 LAB — CBC WITH DIFFERENTIAL/PLATELET
Abs Immature Granulocytes: 0.01 10*3/uL (ref 0.00–0.07)
Basophils Absolute: 0 10*3/uL (ref 0.0–0.1)
Basophils Relative: 1 %
Eosinophils Absolute: 0.1 10*3/uL (ref 0.0–0.5)
Eosinophils Relative: 2 %
HCT: 33.8 % — ABNORMAL LOW (ref 39.0–52.0)
Hemoglobin: 11.3 g/dL — ABNORMAL LOW (ref 13.0–17.0)
Immature Granulocytes: 0 %
Lymphocytes Relative: 38 %
Lymphs Abs: 2.3 10*3/uL (ref 0.7–4.0)
MCH: 31.6 pg (ref 26.0–34.0)
MCHC: 33.4 g/dL (ref 30.0–36.0)
MCV: 94.4 fL (ref 80.0–100.0)
Monocytes Absolute: 0.8 10*3/uL (ref 0.1–1.0)
Monocytes Relative: 13 %
Neutro Abs: 2.9 10*3/uL (ref 1.7–7.7)
Neutrophils Relative %: 46 %
Platelets: 549 10*3/uL — ABNORMAL HIGH (ref 150–400)
RBC: 3.58 MIL/uL — ABNORMAL LOW (ref 4.22–5.81)
RDW: 24.7 % — ABNORMAL HIGH (ref 11.5–15.5)
WBC: 6.2 10*3/uL (ref 4.0–10.5)
nRBC: 0.3 % — ABNORMAL HIGH (ref 0.0–0.2)

## 2022-11-17 MED ORDER — LUSPATERCEPT-AAMT 75 MG ~~LOC~~ SOLR
0.7000 mg/kg | Freq: Once | SUBCUTANEOUS | Status: AC
  Administered 2022-11-17: 75 mg via SUBCUTANEOUS
  Filled 2022-11-17: qty 1.5

## 2022-11-17 NOTE — Progress Notes (Signed)
Patient presents today for Reblozyl injection. Dose reduction today by Dr. Ellin Saba. HGB 11.3.   Stephen Watts presents today for injection per the provider's orders.  Reblozyl administration without incident; injection site WNL; see MAR for injection details.  Patient tolerated procedure well and without incident. No complaints at this time. Discharged from clinic ambulatory in stable condition. Alert and oriented x 3. F/U with [facility] as scheduled.

## 2022-11-17 NOTE — Progress Notes (Signed)
Decrease dose of Reblozyl to 0.7 mg/kg (75 mg) with Hgb 11.3 today.  V.O. Dr Carilyn Goodpasture, PharmD

## 2022-11-17 NOTE — Patient Instructions (Signed)
[facility]  Discharge Instructions: Thank you for choosing [facility] to provide your oncology and hematology care.  If you have a lab appointment with the Cancer Center - please note that after April 8th, 2024, all labs will be drawn in the cancer center.  You do not have to check in or register with the main entrance as you have in the past but will complete your check-in in the cancer center.  Wear comfortable clothing and clothing appropriate for easy access to any Portacath or PICC line.   We strive to give you quality time with your provider. You may need to reschedule your appointment if you arrive late (15 or more minutes).  Arriving late affects you and other patients whose appointments are after yours.  Also, if you miss three or more appointments without notifying the office, you may be dismissed from the clinic at the provider's discretion.      For prescription refill requests, have your pharmacy contact our office and allow 72 hours for refills to be completed.    Today you received the following chemotherapy and/or immunotherapy agents Reblozyl.  Luspatercept Injection What is this medication? LUSPATERCEPT (lus PAT er sept) treats low levels of red blood cells (anemia) in the body in people with beta thalassemia or myelodysplastic syndromes. It works by helping the body make more red blood cells. This medicine may be used for other purposes; ask your health care provider or pharmacist if you have questions. COMMON BRAND NAME(S): REBLOZYL What should I tell my care team before I take this medication? They need to know if you have any of these conditions: Have had your spleen removed High blood pressure History of blood clots Tobacco use An unusual or allergic reaction to luspatercept, other medications, foods, dyes or preservatives Pregnant or trying to get pregnant Breast-feeding How should I use this medication? This medication is for  injection under the skin. It is given by your care team in a hospital or clinic setting. Talk to your care team about the use of the medication in children. This medication is not approved for use in children. Overdosage: If you think you have taken too much of this medicine contact a poison control center or emergency room at once. NOTE: This medicine is only for you. Do not share this medicine with others. What if I miss a dose? Keep appointments for follow-up doses. It is important not to miss your dose. Call your care team if you are unable to keep an appointment. What may interact with this medication? Interactions are not expected. This list may not describe all possible interactions. Give your health care provider a list of all the medicines, herbs, non-prescription drugs, or dietary supplements you use. Also tell them if you smoke, drink alcohol, or use illegal drugs. Some items may interact with your medicine. What should I watch for while using this medication? Your condition will be monitored carefully while you are receiving this medication. Talk to your care team if you wish to become pregnant or think you might be pregnant. This medication can cause serious birth defects. Discuss contraceptive options with your care team. Do not breastfeed while taking this medication. You may need blood work done while you are taking this medication. What side effects may I notice from receiving this medication? Side effects that you should report to your care team as soon as possible: Allergic reactions--skin rash, itching, hives, swelling of the face, lips, tongue, or throat  Blood clot--pain, swelling, or warmth in the leg, shortness of breath, chest pain Increase in blood pressure Severe back pain, numbness or weakness of the hands, arms, legs, or feet, loss of coordination, loss of bowel or bladder control Side effects that usually do not require medical attention (report these to your care team  if they continue or are bothersome): Bone pain Dizziness Fatigue Headache Joint pain Muscle pain Stomach pain This list may not describe all possible side effects. Call your doctor for medical advice about side effects. You may report side effects to FDA at 1-800-FDA-1088. Where should I keep my medication? This medication is given in a hospital or clinic. It will not be stored at home. NOTE: This sheet is a summary. It may not cover all possible information. If you have questions about this medicine, talk to your doctor, pharmacist, or health care provider.  2023 Elsevier/Gold Standard (2021-02-20 00:00:00)       To help prevent nausea and vomiting after your treatment, we encourage you to take your nausea medication as directed.  BELOW ARE SYMPTOMS THAT SHOULD BE REPORTED IMMEDIATELY: *FEVER GREATER THAN 100.4 F (38 C) OR HIGHER *CHILLS OR SWEATING *NAUSEA AND VOMITING THAT IS NOT CONTROLLED WITH YOUR NAUSEA MEDICATION *UNUSUAL SHORTNESS OF BREATH *UNUSUAL BRUISING OR BLEEDING *URINARY PROBLEMS (pain or burning when urinating, or frequent urination) *BOWEL PROBLEMS (unusual diarrhea, constipation, pain near the anus) TENDERNESS IN MOUTH AND THROAT WITH OR WITHOUT PRESENCE OF ULCERS (sore throat, sores in mouth, or a toothache) UNUSUAL RASH, SWELLING OR PAIN  UNUSUAL VAGINAL DISCHARGE OR ITCHING   Items with * indicate a potential emergency and should be followed up as soon as possible or go to the Emergency Department if any problems should occur.  Please show the CHEMOTHERAPY ALERT CARD or IMMUNOTHERAPY ALERT CARD at check-in to the Emergency Department and triage nurse.  Should you have questions after your visit or need to cancel or reschedule your appointment, please contact [facility] [phone]  and follow the prompts.  Office hours are 8:00 a.m. to 4:30 p.m. Monday - Friday. Please note that voicemails left after 4:00 p.m. may not be returned until  the following business day.  We are closed weekends and major holidays. You have access to a nurse at all times for urgent questions. Please call the main number to the clinic [phone] and follow the prompts.  For any non-urgent questions, you may also contact your provider using MyChart. We now offer e-Visits for anyone 65 and older to request care online for non-urgent symptoms. For details visit [email or web address].   Also download the MyChart app! Go to the app store, search "MyChart", open the app, select [facility], and log in with your MyChart username and password.

## 2022-11-17 NOTE — Telephone Encounter (Signed)
Attempted to reach pt to make him aware of pre op appt that is scheduled for 11/30/22 at 1:45 pm in person at [facility] but voicemail is full at this time.

## 2022-11-19 DIAGNOSIS — E119 Type 2 diabetes mellitus without complications: Secondary | ICD-10-CM | POA: Diagnosis not present

## 2022-11-19 DIAGNOSIS — I10 Essential (primary) hypertension: Secondary | ICD-10-CM | POA: Diagnosis not present

## 2022-11-29 NOTE — Patient Instructions (Signed)
Stephen Watts  11/29/2022     @   Your procedure is scheduled on 12/02/2022.  Report to [facility] at 12:30 PM  Call this number if you have problems the morning of surgery:  [phone]  If you experience any cold or flu symptoms such as cough, fever, chills, shortness of breath, etc. between now and your scheduled surgery, please notify us at the above number.   Remember:    Please follow the diet and prep instructions given to you by Dr [facility]ossom office.     Take these medicines the morning of surgery with A SIP OF WATER : Norvasc and Metoprolol   No diabetic meds the morning of the procedure.     Do not wear jewelry, make-up or nail polish.  Do not wear lotions, powders, or perfumes, or deodorant.  Do not shave 48 hours prior to surgery.  Men may shave face and neck.  Do not bring valuables to the hospital.  [facility] is not responsible for any belongings or valuables.  Contacts, dentures or bridgework may not be worn into surgery.  Leave your suitcase in the car.  After surgery it may be brought to your room.  For patients admitted to the hospital, discharge time will be determined by your treatment team.  Patients discharged the day of surgery will not be allowed to drive home.   Name and phone number of your driver:   family Special instructions:  N/A  Please read over the following fact sheets that you were given. Care and Recovery After Surgery  Colonoscopy, Adult A colonoscopy is a procedure to look at the entire large intestine. This procedure is done using a long, thin, flexible tube that has a camera on the end. You may have a colonoscopy: As a part of normal colorectal screening. If you have certain symptoms, such as: A low number of red blood cells in your blood (anemia). Diarrhea that does not go away. Pain in your abdomen. Blood in your stool. A colonoscopy can help screen for and diagnose medical problems, including: An  abnormal growth of cells or tissue (tumor). Abnormal growths within the lining of your intestine (polyps). Inflammation. Areas of bleeding. Tell your health care provider about: Any allergies you have. All medicines you are taking, including vitamins, herbs, eye drops, creams, and over-the-counter medicines. Any problems you or family members have had with anesthetic medicines. Any bleeding problems you have. Any surgeries you have had. Any medical conditions you have. Any problems you have had with having bowel movements. Whether you are pregnant or may be pregnant. What are the risks? Generally, this is a safe procedure. However, problems may occur, including: Bleeding. Damage to your intestine. Allergic reactions to medicines given during the procedure. Infection. This is rare. What happens before the procedure? Eating and drinking restrictions Follow instructions from your health care provider about eating or drinking restrictions, which may include: A few days before the procedure: Follow a low-fiber diet. Avoid nuts, seeds, dried fruit, raw fruits, and vegetables. 1-3 days before the procedure: Eat only gelatin dessert or ice pops. Drink only clear liquids, such as water, clear juice, clear broth or bouillon, black coffee or tea, or clear soft drinks or sports drinks. Avoid liquids that contain red or purple dye. The day of the procedure: Do not eat solid foods. You may continue to drink clear liquids until up to 2 hours before the procedure. Do not eat or drink anything starting 2 hours before  the procedure, or within the time period that your health care provider recommends. Bowel prep If you were prescribed a bowel prep to take by mouth (orally) to clean out your colon: Take it as told by your health care provider. Starting the day before your procedure, you will need to drink a large amount of liquid medicine. The liquid will cause you to have many bowel movements of loose  stool until your stool becomes almost clear or light green. If your skin or the opening between the buttocks (anus) gets irritated from diarrhea, you may relieve the irritation using: Wipes with medicine in them, such as adult wet wipes with aloe and vitamin E. A product to soothe skin, such as petroleum jelly. If you vomit while drinking the bowel prep: Take a break for up to 60 minutes. Begin the bowel prep again. Call your health care provider if you keep vomiting or you cannot take the bowel prep without vomiting. To clean out your colon, you may also be given: Laxative medicines. These help you have a bowel movement. Instructions for enema use. An enema is liquid medicine injected into your rectum. Medicines Ask your health care provider about: Changing or stopping your regular medicines or supplements. This is especially important if you are taking iron supplements, diabetes medicines, or blood thinners. Taking medicines such as aspirin and ibuprofen. These medicines can thin your blood. Do not take these medicines unless your health care provider tells you to take them. Taking over-the-counter medicines, vitamins, herbs, and supplements. General instructions Ask your health care provider what steps will be taken to help prevent infection. These may include washing skin with a germ-killing soap. If you will be going home right after the procedure, plan to have a responsible adult: Take you home from the hospital or clinic. You will not be allowed to drive. Care for you for the time you are told. What happens during the procedure?  An IV will be inserted into one of your veins. You will be given a medicine to make you fall asleep (general anesthetic). You will lie on your side with your knees bent. A lubricant will be put on the tube. Then the tube will be: Inserted into your anus. Gently eased through all parts of your large intestine. Air will be sent into your colon to keep it  open. This may cause some pressure or cramping. Images will be taken with the camera and will appear on a screen. A small tissue sample may be removed to be looked at under a microscope (biopsy). The tissue may be sent to a lab for testing if any signs of problems are found. If small polyps are found, they may be removed and checked for cancer cells. When the procedure is finished, the tube will be removed. The procedure may vary among health care providers and hospitals. What happens after the procedure? Your blood pressure, heart rate, breathing rate, and blood oxygen level will be monitored until you leave the hospital or clinic. You may have a small amount of blood in your stool. You may pass gas and have mild cramping or bloating in your abdomen. This is caused by the air that was used to open your colon during the exam. If you were given a sedative during the procedure, it can affect you for several hours. Do not drive or operate machinery until your health care provider says that it is safe. It is up to you to get the results of your procedure. Ask  your health care provider, or the department that is doing the procedure, when your results will be ready. Summary A colonoscopy is a procedure to look at the entire large intestine. Follow instructions from your health care provider about eating and drinking before the procedure. If you were prescribed an oral bowel prep to clean out your colon, take it as told by your health care provider. During the colonoscopy, a flexible tube with a camera on its end is inserted into the anus and then passed into all parts of the large intestine. This information is not intended to replace advice given to you by your health care provider. Make sure you discuss any questions you have with your health care provider. Document Revised: 07/20/2021 Document Reviewed: 03/18/2021 Elsevier Patient Education  2023 [employer]  Monitored Anesthesia  Care Anesthesia refers to the techniques, procedures, and medicines that help a person stay safe and comfortable during surgery. Monitored anesthesia care, or sedation, is one type of anesthesia. You may have sedation if you do not need to be asleep for your procedure. Procedures that use sedation may include: Surgery to remove cataracts from your eyes. A dental procedure. A biopsy. This is when a tissue sample is removed and looked at under a microscope. You will be watched closely during your procedure. Your level of sedation or type of anesthesia may be changed to fit your needs. Tell a health care provider about: Any allergies you have. All medicines you are taking, including vitamins, herbs, eye drops, creams, and over-the-counter medicines. Any problems you or family members have had with anesthesia. Any bleeding problems you have. Any surgeries you have had. Any medical conditions or illnesses you have. This includes sleep apnea, cough, fever, or the flu. Whether you are pregnant or may be pregnant. Whether you use cigarettes, alcohol, or drugs. Any use of steroids, whether by mouth or as a cream. What are the risks? Your health care provider will talk with you about risks. These may include: Getting too much medicine (oversedation). Nausea. Allergic reactions to medicines. Trouble breathing. If this happens, a breathing tube may be used to help you breathe. It will be removed when you are awake and breathing on your own. Heart trouble. Lung trouble. Confusion that gets better with time (emergence delirium). What happens before the procedure? When to stop eating and drinking Follow instructions from your health care provider about what you may eat and drink. These may include: 8 hours before your procedure Stop eating most foods. Do not eat meat, fried foods, or fatty foods. Eat only light foods, such as toast or crackers. All liquids are okay except energy drinks and  alcohol. 6 hours before your procedure Stop eating. Drink only clear liquids, such as water, clear fruit juice, black coffee, plain tea, and sports drinks. Do not drink energy drinks or alcohol. 2 hours before your procedure Stop drinking all liquids. You may be allowed to take medicines with small sips of water. If you do not follow your health care provider's instructions, your procedure may be delayed or canceled. Medicines Ask your health care provider about: Changing or stopping your regular medicines. These include any diabetes medicines or blood thinners you take. Taking medicines such as aspirin and ibuprofen. These medicines can thin your blood. Do not take them unless your health care provider tells you to. Taking over-the-counter medicines, vitamins, herbs, and supplements. Testing You may have an exam or testing. You may have a blood or urine sample taken. General instructions  Do not use any products that contain nicotine or tobacco for at least 4 weeks before the procedure. These products include cigarettes, chewing tobacco, and vaping devices, such as e-cigarettes. If you need help quitting, ask your health care provider. If you will be going home right after the procedure, plan to have a responsible adult: Take you home from the hospital or clinic. You will not be allowed to drive. Care for you for the time you are told. What happens during the procedure?  Your blood pressure, heart rate, breathing, level of pain, and blood oxygen level will be monitored. An IV will be inserted into one of your veins. You may be given: A sedative. This helps you relax. Anesthesia. This will: Numb certain areas of your body. Make you fall asleep for surgery. You will be given medicines as needed to keep you comfortable. The more medicine you are given, the deeper your level of sedation will be. Your level of sedation may be changed to fit your needs. There are three levels of  sedation: Mild sedation. At this level, you may feel awake and relaxed. You will be able to follow directions. Moderate sedation. At this level, you will be sleepy. You may not remember the procedure. Deep sedation. At this level, you will be asleep. You will not remember the procedure. How you get the medicines will depend on your age and the procedure. They may be given as: A pill. This may be taken by mouth (orally) or inserted into the rectum. An injection. This may be into a vein or muscle. A spray through the nose. After your procedure is over, the medicine will be stopped. The procedure may vary among health care providers and hospitals. What happens after the procedure? Your blood pressure, heart rate, breathing rate, and blood oxygen level will be monitored until you leave the hospital or clinic. You may feel sleepy, clumsy, or nauseous. You may not remember what happened during or after the procedure. Sedation can affect you for several hours. Do not drive or use machinery until your health care provider says that it is safe. This information is not intended to replace advice given to you by your health care provider. Make sure you discuss any questions you have with your health care provider. Document Revised: 12/21/2021 Document Reviewed: 12/21/2021 Elsevier Patient Education  2023 [employer].

## 2022-11-30 ENCOUNTER — Encounter (HOSPITAL_COMMUNITY)
Admission: RE | Admit: 2022-11-30 | Discharge: 2022-11-30 | Disposition: A | Discharge status: Home / Self Care | Payer: 59 | Source: Ambulatory Visit | Attending: Internal Medicine | Admitting: Internal Medicine

## 2022-11-30 ENCOUNTER — Telehealth: Payer: Self-pay | Admitting: *Deleted

## 2022-11-30 DIAGNOSIS — I10 Essential (primary) hypertension: Secondary | ICD-10-CM

## 2022-11-30 NOTE — Telephone Encounter (Signed)
Received message from Fleet Contras, [place] nurse at [facility], that pt didn't show up for pre-op appointment today.  Attempted to call pt, mailbox is full

## 2022-11-30 NOTE — Progress Notes (Signed)
Pt did not show up for PAT visit today. Called GI office and left voicemail for scheduler.

## 2022-12-02 ENCOUNTER — Encounter (HOSPITAL_COMMUNITY): Admission: RE | Payer: Self-pay | Source: Home / Self Care

## 2022-12-02 ENCOUNTER — Ambulatory Visit (HOSPITAL_COMMUNITY): Admission: RE | Admit: 2022-12-02 | Payer: 59 | Source: Home / Self Care

## 2022-12-02 SURGERY — COLONOSCOPY WITH PROPOFOL
Anesthesia: Monitor Anesthesia Care

## 2022-12-02 NOTE — Telephone Encounter (Signed)
Pt showed up in endo. Per Dr. Marletta Lor unable to do today. He will have to be rescheduled.

## 2022-12-03 NOTE — Telephone Encounter (Signed)
Called pt, no answer and VM full

## 2022-12-06 NOTE — Telephone Encounter (Signed)
Called pt, no answer and VM full

## 2022-12-08 ENCOUNTER — Inpatient Hospital Stay: Payer: 59

## 2022-12-08 ENCOUNTER — Inpatient Hospital Stay: Payer: 59 | Attending: Hematology

## 2022-12-08 VITALS — BP 148/68 | HR 75 | Temp 96.5°F | Resp 16 | Wt 241.0 lb

## 2022-12-08 DIAGNOSIS — D461 Refractory anemia with ring sideroblasts: Secondary | ICD-10-CM | POA: Insufficient documentation

## 2022-12-08 DIAGNOSIS — Z79899 Other long term (current) drug therapy: Secondary | ICD-10-CM | POA: Diagnosis not present

## 2022-12-08 DIAGNOSIS — Z886 Allergy status to analgesic agent status: Secondary | ICD-10-CM | POA: Insufficient documentation

## 2022-12-08 DIAGNOSIS — Z833 Family history of diabetes mellitus: Secondary | ICD-10-CM | POA: Insufficient documentation

## 2022-12-08 DIAGNOSIS — Z803 Family history of malignant neoplasm of breast: Secondary | ICD-10-CM | POA: Diagnosis not present

## 2022-12-08 DIAGNOSIS — R059 Cough, unspecified: Secondary | ICD-10-CM | POA: Insufficient documentation

## 2022-12-08 DIAGNOSIS — Z882 Allergy status to sulfonamides status: Secondary | ICD-10-CM | POA: Insufficient documentation

## 2022-12-08 DIAGNOSIS — Z8249 Family history of ischemic heart disease and other diseases of the circulatory system: Secondary | ICD-10-CM | POA: Insufficient documentation

## 2022-12-08 DIAGNOSIS — R7989 Other specified abnormal findings of blood chemistry: Secondary | ICD-10-CM | POA: Diagnosis not present

## 2022-12-08 DIAGNOSIS — R0602 Shortness of breath: Secondary | ICD-10-CM | POA: Insufficient documentation

## 2022-12-08 DIAGNOSIS — D75839 Thrombocytosis, unspecified: Secondary | ICD-10-CM | POA: Diagnosis not present

## 2022-12-08 DIAGNOSIS — R5383 Other fatigue: Secondary | ICD-10-CM | POA: Diagnosis not present

## 2022-12-08 DIAGNOSIS — Z800 Family history of malignant neoplasm of digestive organs: Secondary | ICD-10-CM | POA: Insufficient documentation

## 2022-12-08 DIAGNOSIS — D469 Myelodysplastic syndrome, unspecified: Secondary | ICD-10-CM

## 2022-12-08 DIAGNOSIS — Z9049 Acquired absence of other specified parts of digestive tract: Secondary | ICD-10-CM | POA: Diagnosis not present

## 2022-12-08 DIAGNOSIS — I10 Essential (primary) hypertension: Secondary | ICD-10-CM | POA: Diagnosis not present

## 2022-12-08 DIAGNOSIS — D649 Anemia, unspecified: Secondary | ICD-10-CM

## 2022-12-08 LAB — CBC
HCT: 33.7 % — ABNORMAL LOW (ref 39.0–52.0)
Hemoglobin: 11 g/dL — ABNORMAL LOW (ref 13.0–17.0)
MCH: 31.3 pg (ref 26.0–34.0)
MCHC: 32.6 g/dL (ref 30.0–36.0)
MCV: 96 fL (ref 80.0–100.0)
Platelets: 441 10*3/uL — ABNORMAL HIGH (ref 150–400)
RBC: 3.51 MIL/uL — ABNORMAL LOW (ref 4.22–5.81)
RDW: 26.1 % — ABNORMAL HIGH (ref 11.5–15.5)
WBC: 7.4 10*3/uL (ref 4.0–10.5)
nRBC: 0.7 % — ABNORMAL HIGH (ref 0.0–0.2)

## 2022-12-08 MED ORDER — LUSPATERCEPT-AAMT 75 MG ~~LOC~~ SOLR
0.7000 mg/kg | Freq: Once | SUBCUTANEOUS | Status: DC
  Filled 2022-12-08: qty 1.5

## 2022-12-08 MED ORDER — LUSPATERCEPT-AAMT 75 MG ~~LOC~~ SOLR
0.7000 mg/kg | Freq: Once | SUBCUTANEOUS | Status: AC
  Administered 2022-12-08: 75 mg via SUBCUTANEOUS
  Filled 2022-12-08: qty 1.5

## 2022-12-08 NOTE — Patient Instructions (Signed)
[facility]  Discharge Instructions: Thank you for choosing [facility] to provide your oncology and hematology care.  If you have a lab appointment with the Cancer Center - please note that after April 8th, 2024, all labs will be drawn in the cancer center.  You do not have to check in or register with the main entrance as you have in the past but will complete your check-in in the cancer center.  Wear comfortable clothing and clothing appropriate for easy access to any Portacath or PICC line.   We strive to give you quality time with your provider. You may need to reschedule your appointment if you arrive late (15 or more minutes).  Arriving late affects you and other patients whose appointments are after yours.  Also, if you miss three or more appointments without notifying the office, you may be dismissed from the clinic at the provider's discretion.      For prescription refill requests, have your pharmacy contact our office and allow 72 hours for refills to be completed.    Today you received the following Reblozyl, return as scheduled.   To help prevent nausea and vomiting after your treatment, we encourage you to take your nausea medication as directed.  BELOW ARE SYMPTOMS THAT SHOULD BE REPORTED IMMEDIATELY: *FEVER GREATER THAN 100.4 F (38 C) OR HIGHER *CHILLS OR SWEATING *NAUSEA AND VOMITING THAT IS NOT CONTROLLED WITH YOUR NAUSEA MEDICATION *UNUSUAL SHORTNESS OF BREATH *UNUSUAL BRUISING OR BLEEDING *URINARY PROBLEMS (pain or burning when urinating, or frequent urination) *BOWEL PROBLEMS (unusual diarrhea, constipation, pain near the anus) TENDERNESS IN MOUTH AND THROAT WITH OR WITHOUT PRESENCE OF ULCERS (sore throat, sores in mouth, or a toothache) UNUSUAL RASH, SWELLING OR PAIN  UNUSUAL VAGINAL DISCHARGE OR ITCHING   Items with * indicate a potential emergency and should be followed up as soon as possible or go to the Emergency Department if  any problems should occur.  Please show the CHEMOTHERAPY ALERT CARD or IMMUNOTHERAPY ALERT CARD at check-in to the Emergency Department and triage nurse.  Should you have questions after your visit or need to cancel or reschedule your appointment, please contact [facility] [phone]  and follow the prompts.  Office hours are 8:00 a.m. to 4:30 p.m. Monday - Friday. Please note that voicemails left after 4:00 p.m. may not be returned until the following business day.  We are closed weekends and major holidays. You have access to a nurse at all times for urgent questions. Please call the main number to the clinic [phone] and follow the prompts.  For any non-urgent questions, you may also contact your provider using MyChart. We now offer e-Visits for anyone 76 and older to request care online for non-urgent symptoms. For details visit [email or web address].   Also download the MyChart app! Go to the app store, search "MyChart", open the app, select [place], and log in with your MyChart username and password.

## 2022-12-08 NOTE — Progress Notes (Signed)
Patient presents today for Reblozyl injection. Hemoglobin reviewed prior to administration. VSS tolerated without incident or complaint. See MAR for details. Patient stable during and after injection. Patient discharged in satisfactory condition with no s/s of distress noted.

## 2022-12-28 NOTE — Progress Notes (Signed)
[facility] [address] [place] [address]    Clinic Day:  12/29/2022  Referring physician: Elisabeth Cara, PA  Patient Care Team: Elisabeth Cara, [place] as PCP - General (Family Medicine) Doreatha Massed, MD as Medical Oncologist (Hematology)   ASSESSMENT & PLAN:   Assessment: 1. MDS with ringed sideroblasts and thrombocytosis: - Patient seen for normocytic anemia with CBC on 05/08/2021 showing hemoglobin 8.4, MCV 97 and platelet count 631. - Ferritin was 1454, percent saturation was 85.  Hemochromatosis was negative. - He is taking iron tablet 2 times daily for the last 1 year. - Colonoscopy was reportedly done in [place] 3 years ago, within normal limits. - BMBX (10/08/2021): Hypercellular marrow with erythroid hyperplasia, erythroid dysplasia and ring sideroblasts.  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 - IPSS M: Score is -0.73 (low risk).  IPSS-R score 2.5.  Median leukemia free survival was 5.9%.  Median overall survival was 6 years.  AML transformation rate was 1.7 %/year. - Ring sideroblasts were more than 15%.  JAK2 V617F and reflex testing and BCR/ABL by FISH were negative for thrombocytosis. - Serum EPO was 107. - Retacrit 30,000 units weekly started on 11/26/2021, dose changed to 40,000 units every 2 weeks on 12/31/2021, dose increased to 60,000 units weekly on 03/01/2022.  Last dose of Retacrit on 06/22/2022.  Luspatercept 1 mg/kg started on 06/15/2022.   2. 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 - Denies any smoking history. - Paternal aunt had pancreatic cancer.  Sister had breast cancer.    Plan: 1. MDS with ring sideroblasts and thrombocytosis: - Stool for occult blood was negative on 09/09/2022. - He is tolerating Luspatercept very well. - Hemoglobin  today is 10.9.  Ferritin is 1400 and percent saturation 84.  B12 is 519. - Recommend increasing Luspatercept to 1 mg/kg dose.  Continue Luspatercept every 3 weeks.  RTC 12 weeks for follow-up. - As he is complaining of severe fatigue, will check TSH also.  2.  Elevated ferritin (hemochromatosis negative): - Ferritin 1406 and percent saturation 84, slightly improved from last time.  Continue close monitoring.  3.  Hypertension: - Continue lisinopril, HCTZ and Norvasc.  Blood pressure is well-controlled today.    Orders Placed This Encounter  Procedures   Vitamin B12    Standing Status:   Future    Number of Occurrences:   1    Standing Expiration Date:   12/29/2023   CBC with Differential    Standing Status:   Future    Standing Expiration Date:   04/13/2024   CBC with Differential    Standing Status:   Future    Standing Expiration Date:   05/04/2024   CBC with Differential    Standing Status:   Future    Standing Expiration Date:   05/25/2024   TSH    Standing Status:   Future    Number of Occurrences:   1    Standing Expiration Date:   12/29/2023      I,Katie Daubenspeck,acting as a scribe for Doreatha Massed, MD.,have documented all relevant documentation on the behalf of Doreatha Massed, MD,as directed by  Doreatha Massed, MD while in the presence of Doreatha Massed, MD.   I, Doreatha Massed MD, have reviewed the above documentation for accuracy and completeness, and I  agree with the above.   Doreatha Massed, MD   5/22/20246:10 PM  CHIEF COMPLAINT:   Diagnosis: normocytic anemia from MDS    Cancer Staging  No matching staging information was found for the patient.   Prior Therapy: Retacrit 60,000 units weekly   Current Therapy:  Luspatercept every 3 weeks    HISTORY OF PRESENT ILLNESS:   Oncology History  MDS (myelodysplastic syndrome) (HCC)  11/14/2021 Initial Diagnosis   MDS (myelodysplastic syndrome) (HCC)   06/15/2022 -   Chemotherapy   Patient is on Treatment Plan : MYELODYSPLASIA Luspatercept q21d        INTERVAL HISTORY:   Stephen Watts is a 62 y.o. male presenting to clinic today for follow up of normocytic anemia from MDS. He was last seen by me on 10/06/22.  Today, he states that he is doing well overall. His appetite level is at 75%. His energy level is at 10%.  PAST MEDICAL HISTORY:   Past Medical History: Past Medical History:  Diagnosis Date   Anemia    Diabetes (HCC)    Hypertension     Surgical History: Past Surgical History:  Procedure Laterality Date   APPENDECTOMY     age 55 years old    Social History: Social History   Socioeconomic History   Marital status: Widowed    Spouse name: Not on file   Number of children: Not on file   Years of education: Not on file   Highest education level: Not on file  Occupational History   Not on file  Tobacco Use   Smoking status: Never    Passive exposure: Never   Smokeless tobacco: Never  Substance and Sexual Activity   Alcohol use: Never   Drug use: Never   Sexual activity: Not on file  Other Topics Concern   Not on file  Social History Narrative   Not on file   Social Determinants of Health   Financial Resource Strain: Not on file  Food Insecurity: Not on file  Transportation Needs: Not on file  Physical Activity: Not on file  Stress: Not on file  Social Connections: Not on file  Intimate Partner Violence: Not on file    Family History: Family History  Problem Relation Age of Onset   Heart disease Mother    Diabetes Sister     Current Medications:  Current Outpatient Medications:    amLODipine (NORVASC) 10 MG tablet, Take 10 mg by mouth daily., Disp: , Rfl:    ferrous sulfate 325 (65 FE) MG tablet, Take 325 mg by mouth 2 (two) times a week., Disp: , Rfl:    glimepiride (AMARYL) 2 MG tablet, Take 2 mg by mouth daily with breakfast., Disp: , Rfl:    hydrochlorothiazide (HYDRODIURIL) 25 MG tablet, Take 25 mg by mouth  daily., Disp: , Rfl:    levothyroxine (SYNTHROID) 125 MCG tablet, TAKE 1 TABLET BY MOUTH MONDAY THRU SATURDAY, Disp: , Rfl:    lisinopril (ZESTRIL) 40 MG tablet, Take 40 mg by mouth daily., Disp: , Rfl:    metFORMIN (GLUCOPHAGE) 1000 MG tablet, Take 1,000 mg by mouth 2 (two) times daily., Disp: , Rfl:    metoprolol succinate (TOPROL-XL) 50 MG 24 hr tablet, Take 50 mg by mouth daily., Disp: , Rfl:    polyethylene glycol-electrolytes (NULYTELY) 420 g solution, Take 4,000 mLs by mouth once., Disp: , Rfl:    sildenafil (VIAGRA) 50 MG tablet, Take 50 mg by mouth daily as needed., Disp: , Rfl:  sitaGLIPtin (JANUVIA) 100 MG tablet, Take 100 mg by mouth daily., Disp: , Rfl:  No current facility-administered medications for this visit.  Facility-Administered Medications Ordered in Other Visits:    acetaminophen (TYLENOL) 325 MG tablet, , , ,    loratadine (CLARITIN) 10 MG tablet, , , ,    Allergies: Allergies  Allergen Reactions   Ibuprofen Hives   Sulfa Antibiotics Rash    REVIEW OF SYSTEMS:   Review of Systems  Constitutional:  Positive for fatigue. Negative for chills and fever.  HENT:   Negative for lump/mass, mouth sores, nosebleeds, sore throat and trouble swallowing.   Eyes:  Negative for eye problems.  Respiratory:  Positive for cough and shortness of breath.   Cardiovascular:  Negative for chest pain, leg swelling and palpitations.  Gastrointestinal:  Negative for abdominal pain, constipation, diarrhea, nausea and vomiting.  Genitourinary:  Negative for bladder incontinence, difficulty urinating, dysuria, frequency, hematuria and nocturia.   Musculoskeletal:  Negative for arthralgias, back pain, flank pain, myalgias and neck pain.  Skin:  Negative for itching and rash.  Neurological:  Negative for dizziness, headaches and numbness.  Hematological:  Does not bruise/bleed easily.  Psychiatric/Behavioral:  Negative for depression, sleep disturbance and suicidal ideas. The patient is  not nervous/anxious.   All other systems reviewed and are negative.    VITALS:   Blood pressure (!) 145/77, pulse 72, temperature 97.6 F (36.4 C), temperature source Tympanic, resp. rate 16, weight 239 lb (108.4 kg), SpO2 100 %.  Wt Readings from Last 3 Encounters:  12/29/22 239 lb (108.4 kg)  12/08/22 241 lb (109.3 kg)  11/03/22 240 lb 14.4 oz (109.3 kg)    Body mass index is 37.43 kg/m.  Performance status (ECOG): 1 - Symptomatic but completely ambulatory  PHYSICAL EXAM:   Physical Exam Vitals and nursing note reviewed. Exam conducted with a chaperone present.  Constitutional:      Appearance: Normal appearance.  Cardiovascular:     Rate and Rhythm: Normal rate and regular rhythm.     Pulses: Normal pulses.     Heart sounds: Normal heart sounds.  Pulmonary:     Effort: Pulmonary effort is normal.     Breath sounds: Normal breath sounds.  Abdominal:     Palpations: Abdomen is soft. There is no hepatomegaly, splenomegaly or mass.     Tenderness: There is no abdominal tenderness.  Musculoskeletal:     Right lower leg: No edema.     Left lower leg: No edema.  Lymphadenopathy:     Cervical: No cervical adenopathy.     Right cervical: No superficial, deep or posterior cervical adenopathy.    Left cervical: No superficial, deep or posterior cervical adenopathy.     Upper Body:     Right upper body: No supraclavicular or axillary adenopathy.     Left upper body: No supraclavicular or axillary adenopathy.  Neurological:     General: No focal deficit present.     Mental Status: He is alert and oriented to person, place, and time.  Psychiatric:        Mood and Affect: Mood normal.        Behavior: Behavior normal.     LABS:      Latest Ref Rng & Units 12/29/2022   10:09 AM 12/08/2022    9:39 AM 11/17/2022   10:16 AM  CBC  WBC 4.0 - 10.5 K/uL 5.8  7.4  6.2   Hemoglobin 13.0 - 17.0 g/dL 20.2  54.2  70.6  Hematocrit 39.0 - 52.0 % 32.7  33.7  33.8   Platelets 150 -  400 K/uL 546  441  549       Latest Ref Rng & Units 10/06/2022   10:35 AM 09/09/2022   11:50 AM 06/08/2022   11:17 AM  CMP  Glucose 70 - 99 mg/dL 295  89  621   BUN 8 - 23 mg/dL 15  17  15    Creatinine 0.61 - 1.24 mg/dL 3.08  6.57  8.46   Sodium 135 - 145 mmol/L 132  137  139   Potassium 3.5 - 5.1 mmol/L 3.8  3.6  3.5   Chloride 98 - 111 mmol/L 101  103  105   CO2 22 - 32 mmol/L 26  26  26    Calcium 8.9 - 10.3 mg/dL 8.7  9.0  9.3   Total Protein 6.5 - 8.1 g/dL 7.8  7.9  7.6   Total Bilirubin 0.3 - 1.2 mg/dL 1.3  0.9  1.1   Alkaline Phos 38 - 126 U/L 87  81  73   AST 15 - 41 U/L 34  25  28   ALT 0 - 44 U/L 43  30  37      No results found for: "CEA1", "CEA" / No results found for: "CEA1", "CEA" No results found for: "PSA1" No results found for: "NGE952" No results found for: "CAN125"  Lab Results  Component Value Date   TOTALPROTELP 8.2 09/11/2021   ALBUMINELP 4.6 (H) 09/11/2021   A1GS 0.2 09/11/2021   A2GS 0.5 09/11/2021   BETS 1.0 09/11/2021   GAMS 1.9 (H) 09/11/2021   MSPIKE Not Observed 09/11/2021   SPEI Comment 09/11/2021   Lab Results  Component Value Date   TIBC 214 (L) 12/29/2022   TIBC 197 (L) 10/06/2022   TIBC 224 (L) 09/09/2022   FERRITIN 1,406 (H) 12/29/2022   FERRITIN 1,426 (H) 10/06/2022   FERRITIN 1,304 (H) 09/09/2022   IRONPCTSAT 84 (H) 12/29/2022   IRONPCTSAT 94 (H) 10/06/2022   IRONPCTSAT 82 (H) 09/09/2022   Lab Results  Component Value Date   LDH 142 06/08/2022   LDH 174 04/05/2022   LDH 160 09/11/2021     STUDIES:   No results found.

## 2022-12-29 ENCOUNTER — Encounter: Payer: Self-pay | Admitting: Hematology

## 2022-12-29 ENCOUNTER — Inpatient Hospital Stay: Payer: 59

## 2022-12-29 ENCOUNTER — Inpatient Hospital Stay: Payer: 59 | Admitting: Hematology

## 2022-12-29 VITALS — BP 145/77 | HR 72 | Temp 97.6°F | Resp 16 | Wt 239.0 lb

## 2022-12-29 DIAGNOSIS — Z886 Allergy status to analgesic agent status: Secondary | ICD-10-CM | POA: Diagnosis not present

## 2022-12-29 DIAGNOSIS — D469 Myelodysplastic syndrome, unspecified: Secondary | ICD-10-CM

## 2022-12-29 DIAGNOSIS — D649 Anemia, unspecified: Secondary | ICD-10-CM

## 2022-12-29 DIAGNOSIS — D461 Refractory anemia with ring sideroblasts: Secondary | ICD-10-CM | POA: Diagnosis not present

## 2022-12-29 DIAGNOSIS — Z882 Allergy status to sulfonamides status: Secondary | ICD-10-CM | POA: Diagnosis not present

## 2022-12-29 DIAGNOSIS — R5383 Other fatigue: Secondary | ICD-10-CM | POA: Diagnosis not present

## 2022-12-29 DIAGNOSIS — R059 Cough, unspecified: Secondary | ICD-10-CM | POA: Diagnosis not present

## 2022-12-29 DIAGNOSIS — R7989 Other specified abnormal findings of blood chemistry: Secondary | ICD-10-CM | POA: Diagnosis not present

## 2022-12-29 DIAGNOSIS — Z79899 Other long term (current) drug therapy: Secondary | ICD-10-CM | POA: Diagnosis not present

## 2022-12-29 DIAGNOSIS — Z8249 Family history of ischemic heart disease and other diseases of the circulatory system: Secondary | ICD-10-CM | POA: Diagnosis not present

## 2022-12-29 DIAGNOSIS — Z833 Family history of diabetes mellitus: Secondary | ICD-10-CM | POA: Diagnosis not present

## 2022-12-29 DIAGNOSIS — I10 Essential (primary) hypertension: Secondary | ICD-10-CM | POA: Diagnosis not present

## 2022-12-29 DIAGNOSIS — D75839 Thrombocytosis, unspecified: Secondary | ICD-10-CM | POA: Diagnosis not present

## 2022-12-29 DIAGNOSIS — Z803 Family history of malignant neoplasm of breast: Secondary | ICD-10-CM | POA: Diagnosis not present

## 2022-12-29 DIAGNOSIS — Z9049 Acquired absence of other specified parts of digestive tract: Secondary | ICD-10-CM | POA: Diagnosis not present

## 2022-12-29 DIAGNOSIS — Z800 Family history of malignant neoplasm of digestive organs: Secondary | ICD-10-CM | POA: Diagnosis not present

## 2022-12-29 DIAGNOSIS — R0602 Shortness of breath: Secondary | ICD-10-CM | POA: Diagnosis not present

## 2022-12-29 LAB — CBC WITH DIFFERENTIAL/PLATELET
Abs Immature Granulocytes: 0.03 10*3/uL (ref 0.00–0.07)
Basophils Absolute: 0 10*3/uL (ref 0.0–0.1)
Basophils Relative: 1 %
Eosinophils Absolute: 0.1 10*3/uL (ref 0.0–0.5)
Eosinophils Relative: 2 %
HCT: 32.7 % — ABNORMAL LOW (ref 39.0–52.0)
Hemoglobin: 10.9 g/dL — ABNORMAL LOW (ref 13.0–17.0)
Immature Granulocytes: 1 %
Lymphocytes Relative: 37 %
Lymphs Abs: 2.1 10*3/uL (ref 0.7–4.0)
MCH: 31.9 pg (ref 26.0–34.0)
MCHC: 33.3 g/dL (ref 30.0–36.0)
MCV: 95.6 fL (ref 80.0–100.0)
Monocytes Absolute: 0.8 10*3/uL (ref 0.1–1.0)
Monocytes Relative: 14 %
Neutro Abs: 2.7 10*3/uL (ref 1.7–7.7)
Neutrophils Relative %: 45 %
Platelets: 546 10*3/uL — ABNORMAL HIGH (ref 150–400)
RBC: 3.42 MIL/uL — ABNORMAL LOW (ref 4.22–5.81)
RDW: 27.5 % — ABNORMAL HIGH (ref 11.5–15.5)
WBC: 5.8 10*3/uL (ref 4.0–10.5)
nRBC: 1.4 % — ABNORMAL HIGH (ref 0.0–0.2)

## 2022-12-29 LAB — VITAMIN B12: Vitamin B-12: 519 pg/mL (ref 180–914)

## 2022-12-29 LAB — IRON AND TIBC
Iron: 181 ug/dL (ref 45–182)
Saturation Ratios: 84 % — ABNORMAL HIGH (ref 17.9–39.5)
TIBC: 214 ug/dL — ABNORMAL LOW (ref 250–450)
UIBC: 33 ug/dL

## 2022-12-29 LAB — FERRITIN: Ferritin: 1406 ng/mL — ABNORMAL HIGH (ref 24–336)

## 2022-12-29 LAB — TSH: TSH: 1.797 u[IU]/mL (ref 0.350–4.500)

## 2022-12-29 MED ORDER — LUSPATERCEPT-AAMT 75 MG ~~LOC~~ SOLR
0.9500 mg/kg | Freq: Once | SUBCUTANEOUS | Status: AC
  Administered 2022-12-29: 100 mg via SUBCUTANEOUS
  Filled 2022-12-29: qty 1.5

## 2022-12-29 NOTE — Patient Instructions (Addendum)
[facility] at [facility] Discharge Instructions   You were seen and examined today by Dr. Ellin Saba.  He reviewed the results of your lab work today. Your hemoglobin is 10.9. You will receive the Reblozyl injection today. We've increased the dose a little bit from last injection.   We will see you back in 4 months.   Return as scheduled.    Thank you for choosing [facility] at [facility] to provide your oncology and hematology care.  To afford each patient quality time with our provider, please arrive at least 15 minutes before your scheduled appointment time.   If you have a lab appointment with the Cancer Center please come in thru the Main Entrance and check in at the main information desk.  You need to re-schedule your appointment should you arrive 10 or more minutes late.  We strive to give you quality time with our providers, and arriving late affects you and other patients whose appointments are after yours.  Also, if you no show three or more times for appointments you may be dismissed from the clinic at the providers discretion.     Again, thank you for choosing [facility].  Our hope is that these requests will decrease the amount of time that you wait before being seen by our physicians.       _____________________________________________________________  Should you have questions after your visit to [facility], please contact our office at [phone] and follow the prompts.  Our office hours are 8:00 a.m. and 4:30 p.m. Monday - Friday.  Please note that voicemails left after 4:00 p.m. may not be returned until the following business day.  We are closed weekends and major holidays.  You do have access to a nurse 24-7, just call the main number to the clinic [phone] and do not press any options, hold on the line and a nurse will answer the phone.    For prescription refill requests, have your  pharmacy contact our office and allow 72 hours.    Due to Covid, you will need to wear a mask upon entering the hospital. If you do not have a mask, a mask will be given to you at the Main Entrance upon arrival. For doctor visits, patients may have 1 support person age 57 or older with them. For treatment visits, patients can not have anyone with them due to social distancing guidelines and our immunocompromised population.

## 2022-12-29 NOTE — Progress Notes (Signed)
Patient presents today for Reblozyl hemoglobin 10.9  Per Dr. Landry Corporal increase dose to 1 mg/kg, pharmacy aware. Patient tolerated injection with no complaints voiced. Site clean and dry with no bruising or swelling noted at site. See MAR for details. Band aid applied.  Patient stable during and after injection. VSS with discharge and left in satisfactory condition with no s/s of distress noted.

## 2022-12-29 NOTE — Patient Instructions (Signed)
[facility]  Discharge Instructions: Thank you for choosing [facility] to provide your oncology and hematology care.  If you have a lab appointment with the Cancer Center - please note that after April 8th, 2024, all labs will be drawn in the cancer center.  You do not have to check in or register with the main entrance as you have in the past but will complete your check-in in the cancer center.  Wear comfortable clothing and clothing appropriate for easy access to any Portacath or PICC line.   We strive to give you quality time with your provider. You may need to reschedule your appointment if you arrive late (15 or more minutes).  Arriving late affects you and other patients whose appointments are after yours.  Also, if you miss three or more appointments without notifying the office, you may be dismissed from the clinic at the provider's discretion.      For prescription refill requests, have your pharmacy contact our office and allow 72 hours for refills to be completed.    Today you received the following Reblozyl, return as scheduled.   To help prevent nausea and vomiting after your treatment, we encourage you to take your nausea medication as directed.  BELOW ARE SYMPTOMS THAT SHOULD BE REPORTED IMMEDIATELY: *FEVER GREATER THAN 100.4 F (38 C) OR HIGHER *CHILLS OR SWEATING *NAUSEA AND VOMITING THAT IS NOT CONTROLLED WITH YOUR NAUSEA MEDICATION *UNUSUAL SHORTNESS OF BREATH *UNUSUAL BRUISING OR BLEEDING *URINARY PROBLEMS (pain or burning when urinating, or frequent urination) *BOWEL PROBLEMS (unusual diarrhea, constipation, pain near the anus) TENDERNESS IN MOUTH AND THROAT WITH OR WITHOUT PRESENCE OF ULCERS (sore throat, sores in mouth, or a toothache) UNUSUAL RASH, SWELLING OR PAIN  UNUSUAL VAGINAL DISCHARGE OR ITCHING   Items with * indicate a potential emergency and should be followed up as soon as possible or go to the Emergency Department if  any problems should occur.  Please show the CHEMOTHERAPY ALERT CARD or IMMUNOTHERAPY ALERT CARD at check-in to the Emergency Department and triage nurse.  Should you have questions after your visit or need to cancel or reschedule your appointment, please contact [facility] [phone]  and follow the prompts.  Office hours are 8:00 a.m. to 4:30 p.m. Monday - Friday. Please note that voicemails left after 4:00 p.m. may not be returned until the following business day.  We are closed weekends and major holidays. You have access to a nurse at all times for urgent questions. Please call the main number to the clinic [phone] and follow the prompts.  For any non-urgent questions, you may also contact your provider using MyChart. We now offer e-Visits for anyone 18 and older to request care online for non-urgent symptoms. For details visit [email or web address].   Also download the MyChart app! Go to the app store, search "MyChart", open the app, select , and log in with your MyChart username and password.   

## 2022-12-31 ENCOUNTER — Other Ambulatory Visit: Payer: Self-pay

## 2023-01-18 NOTE — Progress Notes (Incomplete)
[facility] [address] [place] [address]    Clinic Day:  01/18/2023  Referring physician: Elisabeth Cara, PA  Patient Care Team: Elisabeth Cara, [place] as PCP - General (Family Medicine) Doreatha Massed, MD as Medical Oncologist (Hematology)   ASSESSMENT & PLAN:   Assessment: 1. MDS with ringed sideroblasts and thrombocytosis: - Patient seen for normocytic anemia with CBC on 05/08/2021 showing hemoglobin 8.4, MCV 97 and platelet count 631. - Ferritin was 1454, percent saturation was 85.  Hemochromatosis was negative. - He is taking iron tablet 2 times daily for the last 1 year. - Colonoscopy was reportedly done in [place] 3 years ago, within normal limits. - BMBX (10/08/2021): Hypercellular marrow with erythroid hyperplasia, erythroid dysplasia and ring sideroblasts.  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 - IPSS M: Score is -0.73 (low risk).  IPSS-R score 2.5.  Median leukemia free survival was 5.9%.  Median overall survival was 6 years.  AML transformation rate was 1.7 %/year. - Ring sideroblasts were more than 15%.  JAK2 V617F and reflex testing and BCR/ABL by FISH were negative for thrombocytosis. - Serum EPO was 107. - Retacrit 30,000 units weekly started on 11/26/2021, dose changed to 40,000 units every 2 weeks on 12/31/2021, dose increased to 60,000 units weekly on 03/01/2022.  Last dose of Retacrit on 06/22/2022.  Luspatercept 1 mg/kg started on 06/15/2022.   2. 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 - Denies any smoking history. - Paternal aunt had pancreatic cancer.  Sister had breast cancer.    Plan: 1. MDS with ring sideroblasts and thrombocytosis: - Stool for occult blood was negative on 09/09/2022. - He is tolerating Luspatercept very well. - Hemoglobin  today is 10.9.  Ferritin is 1400 and percent saturation 84.  B12 is 519. - Recommend increasing Luspatercept to 1 mg/kg dose.  Continue Luspatercept every 3 weeks.  RTC 12 weeks for follow-up. - As he is complaining of severe fatigue, will check TSH also.  2.  Elevated ferritin (hemochromatosis negative): - Ferritin 1406 and percent saturation 84, slightly improved from last time.  Continue close monitoring.  3.  Hypertension: - Continue lisinopril, HCTZ and Norvasc.  Blood pressure is well-controlled today.    No orders of the defined types were placed in this encounter.     I,Katie Daubenspeck,acting as a Neurosurgeon for Doreatha Massed, MD.,have documented all relevant documentation on the behalf of Doreatha Massed, MD,as directed by  Doreatha Massed, MD while in the presence of Doreatha Massed, MD.   ***  Katie Daubenspeck   6/11/202410:20 PM  CHIEF COMPLAINT:   Diagnosis: normocytic anemia from MDS   Cancer Staging  No matching staging information was found for the patient.   Prior Therapy: Retacrit 60,000 units weekly   Current Therapy:  Luspatercept every 3 weeks    HISTORY OF PRESENT ILLNESS:   Oncology History  MDS (myelodysplastic syndrome) (HCC)  11/14/2021 Initial Diagnosis   MDS (myelodysplastic syndrome) (HCC)   06/15/2022 -  Chemotherapy   Patient is on Treatment Plan : MYELODYSPLASIA Luspatercept q21d        INTERVAL HISTORY:   Stephen Watts is a 62 y.o. male presenting to clinic today for follow up of normocytic anemia from MDS. He was last seen by me on 12/29/22.  Today, he states that he is  doing well overall. His appetite level is at ***%. His energy level is at ***%.  PAST MEDICAL HISTORY:   Past Medical History: Past Medical History:  Diagnosis Date   Anemia    Diabetes (HCC)    Hypertension     Surgical History: Past Surgical History:  Procedure Laterality Date   APPENDECTOMY     age 49 years old    Social History: Social  History   Socioeconomic History   Marital status: Widowed    Spouse name: Not on file   Number of children: Not on file   Years of education: Not on file   Highest education level: Not on file  Occupational History   Not on file  Tobacco Use   Smoking status: Never    Passive exposure: Never   Smokeless tobacco: Never  Substance and Sexual Activity   Alcohol use: Never   Drug use: Never   Sexual activity: Not on file  Other Topics Concern   Not on file  Social History Narrative   Not on file   Social Determinants of Health   Financial Resource Strain: Not on file  Food Insecurity: Not on file  Transportation Needs: Not on file  Physical Activity: Not on file  Stress: Not on file  Social Connections: Not on file  Intimate Partner Violence: Not on file    Family History: Family History  Problem Relation Age of Onset   Heart disease Mother    Diabetes Sister     Current Medications:  Current Outpatient Medications:    amLODipine (NORVASC) 10 MG tablet, Take 10 mg by mouth daily., Disp: , Rfl:    ferrous sulfate 325 (65 FE) MG tablet, Take 325 mg by mouth 2 (two) times a week., Disp: , Rfl:    glimepiride (AMARYL) 2 MG tablet, Take 2 mg by mouth daily with breakfast., Disp: , Rfl:    hydrochlorothiazide (HYDRODIURIL) 25 MG tablet, Take 25 mg by mouth daily., Disp: , Rfl:    levothyroxine (SYNTHROID) 125 MCG tablet, TAKE 1 TABLET BY MOUTH MONDAY THRU SATURDAY, Disp: , Rfl:    lisinopril (ZESTRIL) 40 MG tablet, Take 40 mg by mouth daily., Disp: , Rfl:    metFORMIN (GLUCOPHAGE) 1000 MG tablet, Take 1,000 mg by mouth 2 (two) times daily., Disp: , Rfl:    metoprolol succinate (TOPROL-XL) 50 MG 24 hr tablet, Take 50 mg by mouth daily., Disp: , Rfl:    polyethylene glycol-electrolytes (NULYTELY) 420 g solution, Take 4,000 mLs by mouth once., Disp: , Rfl:    sildenafil (VIAGRA) 50 MG tablet, Take 50 mg by mouth daily as needed., Disp: , Rfl:    sitaGLIPtin (JANUVIA) 100 MG  tablet, Take 100 mg by mouth daily., Disp: , Rfl:  No current facility-administered medications for this visit.  Facility-Administered Medications Ordered in Other Visits:    acetaminophen (TYLENOL) 325 MG tablet, , , ,    loratadine (CLARITIN) 10 MG tablet, , , ,    Allergies: Allergies  Allergen Reactions   Ibuprofen Hives   Sulfa Antibiotics Rash    REVIEW OF SYSTEMS:   Review of Systems  Constitutional:  Negative for chills, fatigue and fever.  HENT:   Negative for lump/mass, mouth sores, nosebleeds, sore throat and trouble swallowing.   Eyes:  Negative for eye problems.  Respiratory:  Negative for cough and shortness of breath.   Cardiovascular:  Negative for chest pain, leg swelling and palpitations.  Gastrointestinal:  Negative for abdominal pain, constipation, diarrhea,  nausea and vomiting.  Genitourinary:  Negative for bladder incontinence, difficulty urinating, dysuria, frequency, hematuria and nocturia.   Musculoskeletal:  Negative for arthralgias, back pain, flank pain, myalgias and neck pain.  Skin:  Negative for itching and rash.  Neurological:  Negative for dizziness, headaches and numbness.  Hematological:  Does not bruise/bleed easily.  Psychiatric/Behavioral:  Negative for depression, sleep disturbance and suicidal ideas. The patient is not nervous/anxious.   All other systems reviewed and are negative.    VITALS:   There were no vitals taken for this visit.  Wt Readings from Last 3 Encounters:  12/29/22 239 lb (108.4 kg)  12/08/22 241 lb (109.3 kg)  11/03/22 240 lb 14.4 oz (109.3 kg)    There is no height or weight on file to calculate BMI.  Performance status (ECOG): {CHL ONC [id number]  PHYSICAL EXAM:   Physical Exam Vitals and nursing note reviewed. Exam conducted with a chaperone present.  Constitutional:      Appearance: Normal appearance.  Cardiovascular:     Rate and Rhythm: Normal rate and regular rhythm.     Pulses: Normal pulses.      Heart sounds: Normal heart sounds.  Pulmonary:     Effort: Pulmonary effort is normal.     Breath sounds: Normal breath sounds.  Abdominal:     Palpations: Abdomen is soft. There is no hepatomegaly, splenomegaly or mass.     Tenderness: There is no abdominal tenderness.  Musculoskeletal:     Right lower leg: No edema.     Left lower leg: No edema.  Lymphadenopathy:     Cervical: No cervical adenopathy.     Right cervical: No superficial, deep or posterior cervical adenopathy.    Left cervical: No superficial, deep or posterior cervical adenopathy.     Upper Body:     Right upper body: No supraclavicular or axillary adenopathy.     Left upper body: No supraclavicular or axillary adenopathy.  Neurological:     General: No focal deficit present.     Mental Status: He is alert and oriented to person, place, and time.  Psychiatric:        Mood and Affect: Mood normal.        Behavior: Behavior normal.     LABS:      Latest Ref Rng & Units 12/29/2022   10:09 AM 12/08/2022    9:39 AM 11/17/2022   10:16 AM  CBC  WBC 4.0 - 10.5 K/uL 5.8  7.4  6.2   Hemoglobin 13.0 - 17.0 g/dL 16.1  09.6  04.5   Hematocrit 39.0 - 52.0 % 32.7  33.7  33.8   Platelets 150 - 400 K/uL 546  441  549       Latest Ref Rng & Units 10/06/2022   10:35 AM 09/09/2022   11:50 AM 06/08/2022   11:17 AM  CMP  Glucose 70 - 99 mg/dL 409  89  811   BUN 8 - 23 mg/dL 15  17  15    Creatinine 0.61 - 1.24 mg/dL 9.14  7.82  9.56   Sodium 135 - 145 mmol/L 132  137  139   Potassium 3.5 - 5.1 mmol/L 3.8  3.6  3.5   Chloride 98 - 111 mmol/L 101  103  105   CO2 22 - 32 mmol/L 26  26  26    Calcium 8.9 - 10.3 mg/dL 8.7  9.0  9.3   Total Protein 6.5 - 8.1 g/dL 7.8  7.9  7.6   Total Bilirubin 0.3 - 1.2 mg/dL 1.3  0.9  1.1   Alkaline Phos 38 - 126 U/L 87  81  73   AST 15 - 41 U/L 34  25  28   ALT 0 - 44 U/L 43  30  37      No results found for: "CEA1", "CEA" / No results found for: "CEA1", "CEA" No results found for:  "PSA1" No results found for: "ZOX096" No results found for: "CAN125"  Lab Results  Component Value Date   TOTALPROTELP 8.2 09/11/2021   ALBUMINELP 4.6 (H) 09/11/2021   A1GS 0.2 09/11/2021   A2GS 0.5 09/11/2021   BETS 1.0 09/11/2021   GAMS 1.9 (H) 09/11/2021   MSPIKE Not Observed 09/11/2021   SPEI Comment 09/11/2021   Lab Results  Component Value Date   TIBC 214 (L) 12/29/2022   TIBC 197 (L) 10/06/2022   TIBC 224 (L) 09/09/2022   FERRITIN 1,406 (H) 12/29/2022   FERRITIN 1,426 (H) 10/06/2022   FERRITIN 1,304 (H) 09/09/2022   IRONPCTSAT 84 (H) 12/29/2022   IRONPCTSAT 94 (H) 10/06/2022   IRONPCTSAT 82 (H) 09/09/2022   Lab Results  Component Value Date   LDH 142 06/08/2022   LDH 174 04/05/2022   LDH 160 09/11/2021     STUDIES:   No results found.

## 2023-01-19 ENCOUNTER — Inpatient Hospital Stay: Payer: 59 | Admitting: Hematology

## 2023-01-19 ENCOUNTER — Inpatient Hospital Stay: Payer: 59

## 2023-01-19 ENCOUNTER — Inpatient Hospital Stay: Payer: 59 | Attending: Hematology

## 2023-01-19 VITALS — BP 136/86 | HR 78 | Temp 97.6°F | Resp 20

## 2023-01-19 DIAGNOSIS — D75839 Thrombocytosis, unspecified: Secondary | ICD-10-CM | POA: Insufficient documentation

## 2023-01-19 DIAGNOSIS — D469 Myelodysplastic syndrome, unspecified: Secondary | ICD-10-CM

## 2023-01-19 DIAGNOSIS — D461 Refractory anemia with ring sideroblasts: Secondary | ICD-10-CM | POA: Diagnosis not present

## 2023-01-19 DIAGNOSIS — Z79899 Other long term (current) drug therapy: Secondary | ICD-10-CM | POA: Insufficient documentation

## 2023-01-19 DIAGNOSIS — D649 Anemia, unspecified: Secondary | ICD-10-CM

## 2023-01-19 LAB — CBC
HCT: 32.8 % — ABNORMAL LOW (ref 39.0–52.0)
Hemoglobin: 10.9 g/dL — ABNORMAL LOW (ref 13.0–17.0)
MCH: 32.1 pg (ref 26.0–34.0)
MCHC: 33.2 g/dL (ref 30.0–36.0)
MCV: 96.5 fL (ref 80.0–100.0)
Platelets: 501 10*3/uL — ABNORMAL HIGH (ref 150–400)
RBC: 3.4 MIL/uL — ABNORMAL LOW (ref 4.22–5.81)
RDW: 29.6 % — ABNORMAL HIGH (ref 11.5–15.5)
WBC: 8.1 10*3/uL (ref 4.0–10.5)
nRBC: 2.8 % — ABNORMAL HIGH (ref 0.0–0.2)

## 2023-01-19 LAB — SAMPLE TO BLOOD BANK

## 2023-01-19 MED ORDER — LUSPATERCEPT-AAMT 75 MG ~~LOC~~ SOLR
0.9600 mg/kg | Freq: Once | SUBCUTANEOUS | Status: AC
  Administered 2023-01-19: 100 mg via SUBCUTANEOUS
  Filled 2023-01-19: qty 1.5

## 2023-01-19 NOTE — Progress Notes (Signed)
Patient presents today for labs and Reblozyl injection. HGB 10.9 today.   Message received from Dr. Ellin Saba to give Reblozyl 1mg /kg today. Blood pressure 136/86.   Stephen Watts presents today for injection per the provider's orders.  Reblozyl  administration without incident; injection site WNL; see MAR for injection details.  Patient tolerated procedure well and without incident.  No questions or complaints noted at this time.

## 2023-01-19 NOTE — Patient Instructions (Signed)
[facility]  Discharge Instructions: Thank you for choosing [facility] to provide your oncology and hematology care.  If you have a lab appointment with the Cancer Center - please note that after April 8th, 2024, all labs will be drawn in the cancer center.  You do not have to check in or register with the main entrance as you have in the past but will complete your check-in in the cancer center.  Wear comfortable clothing and clothing appropriate for easy access to any Portacath or PICC line.   We strive to give you quality time with your provider. You may need to reschedule your appointment if you arrive late (15 or more minutes).  Arriving late affects you and other patients whose appointments are after yours.  Also, if you miss three or more appointments without notifying the office, you may be dismissed from the clinic at the provider's discretion.      For prescription refill requests, have your pharmacy contact our office and allow 72 hours for refills to be completed.    Today you received the following chemotherapy and/or immunotherapy agents ReblozylLuspatercept Injection What is this medication? LUSPATERCEPT (lus PAT er sept) treats low levels of red blood cells (anemia) in the body in people with beta thalassemia or myelodysplastic syndromes. It works by helping the body make more red blood cells. This medicine may be used for other purposes; ask your health care provider or pharmacist if you have questions. COMMON BRAND NAME(S): REBLOZYL What should I tell my care team before I take this medication? They need to know if you have any of these conditions: Have had your spleen removed High blood pressure History of blood clots Tobacco use An unusual or allergic reaction to luspatercept, other medications, foods, dyes or preservatives Pregnant or trying to get pregnant Breast-feeding How should I use this medication? This medication is for  injection under the skin. It is given by your care team in a hospital or clinic setting. Talk to your care team about the use of the medication in children. This medication is not approved for use in children. Overdosage: If you think you have taken too much of this medicine contact a poison control center or emergency room at once. NOTE: This medicine is only for you. Do not share this medicine with others. What if I miss a dose? Keep appointments for follow-up doses. It is important not to miss your dose. Call your care team if you are unable to keep an appointment. What may interact with this medication? Interactions are not expected. This list may not describe all possible interactions. Give your health care provider a list of all the medicines, herbs, non-prescription drugs, or dietary supplements you use. Also tell them if you smoke, drink alcohol, or use illegal drugs. Some items may interact with your medicine. What should I watch for while using this medication? Your condition will be monitored carefully while you are receiving this medication. Talk to your care team if you wish to become pregnant or think you might be pregnant. This medication can cause serious birth defects. Discuss contraceptive options with your care team. Do not breastfeed while taking this medication. You may need blood work done while you are taking this medication. What side effects may I notice from receiving this medication? Side effects that you should report to your care team as soon as possible: Allergic reactions--skin rash, itching, hives, swelling of the face, lips, tongue, or throat Blood clot--pain,  swelling, or warmth in the leg, shortness of breath, chest pain Increase in blood pressure Severe back pain, numbness or weakness of the hands, arms, legs, or feet, loss of coordination, loss of bowel or bladder control Side effects that usually do not require medical attention (report these to your care team  if they continue or are bothersome): Bone pain Dizziness Fatigue Headache Joint pain Muscle pain Stomach pain This list may not describe all possible side effects. Call your doctor for medical advice about side effects. You may report side effects to FDA at 1-800-FDA-1088. Where should I keep my medication? This medication is given in a hospital or clinic. It will not be stored at home. NOTE: This sheet is a summary. It may not cover all possible information. If you have questions about this medicine, talk to your doctor, pharmacist, or health care provider.  2024 Elsevier/Gold Standard (2021-02-20 00:00:00)       To help prevent nausea and vomiting after your treatment, we encourage you to take your nausea medication as directed.  BELOW ARE SYMPTOMS THAT SHOULD BE REPORTED IMMEDIATELY: *FEVER GREATER THAN 100.4 F (38 C) OR HIGHER *CHILLS OR SWEATING *NAUSEA AND VOMITING THAT IS NOT CONTROLLED WITH YOUR NAUSEA MEDICATION *UNUSUAL SHORTNESS OF BREATH *UNUSUAL BRUISING OR BLEEDING *URINARY PROBLEMS (pain or burning when urinating, or frequent urination) *BOWEL PROBLEMS (unusual diarrhea, constipation, pain near the anus) TENDERNESS IN MOUTH AND THROAT WITH OR WITHOUT PRESENCE OF ULCERS (sore throat, sores in mouth, or a toothache) UNUSUAL RASH, SWELLING OR PAIN  UNUSUAL VAGINAL DISCHARGE OR ITCHING   Items with * indicate a potential emergency and should be followed up as soon as possible or go to the Emergency Department if any problems should occur.  Please show the CHEMOTHERAPY ALERT CARD or IMMUNOTHERAPY ALERT CARD at check-in to the Emergency Department and triage nurse.  Should you have questions after your visit or need to cancel or reschedule your appointment, please contact [facility] [phone]  and follow the prompts.  Office hours are 8:00 a.m. to 4:30 p.m. Monday - Friday. Please note that voicemails left after 4:00 p.m. may not be returned until  the following business day.  We are closed weekends and major holidays. You have access to a nurse at all times for urgent questions. Please call the main number to the clinic [phone] and follow the prompts.  For any non-urgent questions, you may also contact your provider using MyChart. We now offer e-Visits for anyone 28 and older to request care online for non-urgent symptoms. For details visit [email or web address].   Also download the MyChart app! Go to the app store, search "MyChart", open the app, select [place], and log in with your MyChart username and password.

## 2023-02-07 ENCOUNTER — Other Ambulatory Visit: Payer: Self-pay

## 2023-02-07 DIAGNOSIS — D469 Myelodysplastic syndrome, unspecified: Secondary | ICD-10-CM

## 2023-02-09 ENCOUNTER — Inpatient Hospital Stay: Payer: 59

## 2023-02-09 ENCOUNTER — Inpatient Hospital Stay: Payer: 59 | Attending: Hematology

## 2023-02-09 VITALS — BP 140/72 | HR 77 | Temp 97.6°F | Resp 18

## 2023-02-09 DIAGNOSIS — D469 Myelodysplastic syndrome, unspecified: Secondary | ICD-10-CM

## 2023-02-09 DIAGNOSIS — D461 Refractory anemia with ring sideroblasts: Secondary | ICD-10-CM | POA: Diagnosis not present

## 2023-02-09 DIAGNOSIS — D75839 Thrombocytosis, unspecified: Secondary | ICD-10-CM | POA: Insufficient documentation

## 2023-02-09 DIAGNOSIS — Z79899 Other long term (current) drug therapy: Secondary | ICD-10-CM | POA: Insufficient documentation

## 2023-02-09 LAB — CBC WITH DIFFERENTIAL/PLATELET
Abs Immature Granulocytes: 0.1 10*3/uL — ABNORMAL HIGH (ref 0.00–0.07)
Basophils Absolute: 0.1 10*3/uL (ref 0.0–0.1)
Basophils Relative: 1 %
Eosinophils Absolute: 0.2 10*3/uL (ref 0.0–0.5)
Eosinophils Relative: 3 %
HCT: 33.3 % — ABNORMAL LOW (ref 39.0–52.0)
Hemoglobin: 11.3 g/dL — ABNORMAL LOW (ref 13.0–17.0)
Immature Granulocytes: 1 %
Lymphocytes Relative: 34 %
Lymphs Abs: 3.1 10*3/uL (ref 0.7–4.0)
MCH: 32.4 pg (ref 26.0–34.0)
MCHC: 33.9 g/dL (ref 30.0–36.0)
MCV: 95.4 fL (ref 80.0–100.0)
Monocytes Absolute: 1 10*3/uL (ref 0.1–1.0)
Monocytes Relative: 12 %
Neutro Abs: 4.4 10*3/uL (ref 1.7–7.7)
Neutrophils Relative %: 49 %
Platelets: 552 10*3/uL — ABNORMAL HIGH (ref 150–400)
RBC: 3.49 MIL/uL — ABNORMAL LOW (ref 4.22–5.81)
RDW: 27.9 % — ABNORMAL HIGH (ref 11.5–15.5)
WBC: 9 10*3/uL (ref 4.0–10.5)
nRBC: 1.8 % — ABNORMAL HIGH (ref 0.0–0.2)

## 2023-02-09 LAB — IRON AND TIBC
Iron: 107 ug/dL (ref 45–182)
Saturation Ratios: 56 % — ABNORMAL HIGH (ref 17.9–39.5)
TIBC: 190 ug/dL — ABNORMAL LOW (ref 250–450)
UIBC: 83 ug/dL

## 2023-02-09 LAB — FERRITIN: Ferritin: 1253 ng/mL — ABNORMAL HIGH (ref 24–336)

## 2023-02-09 MED ORDER — LUSPATERCEPT-AAMT 75 MG ~~LOC~~ SOLR
0.9500 mg/kg | Freq: Once | SUBCUTANEOUS | Status: AC
  Administered 2023-02-09: 100 mg via SUBCUTANEOUS
  Filled 2023-02-09: qty 1.5

## 2023-02-09 NOTE — Patient Instructions (Signed)
[facility] [place]  Discharge Instructions: Thank you for choosing [facility] to provide your oncology and hematology care.  If you have a lab appointment with the Cancer Center - please note that after April 8th, 2024, all labs will be drawn in the cancer center.  You do not have to check in or register with the main entrance as you have in the past but will complete your check-in in the cancer center.  Wear comfortable clothing and clothing appropriate for easy access to any Portacath or PICC line.   We strive to give you quality time with your provider. You may need to reschedule your appointment if you arrive late (15 or more minutes).  Arriving late affects you and other patients whose appointments are after yours.  Also, if you miss three or more appointments without notifying the office, you may be dismissed from the clinic at the provider's discretion.      For prescription refill requests, have your pharmacy contact our office and allow 72 hours for refills to be completed.  To help prevent nausea and vomiting after your treatment, we encourage you to take your nausea medication as directed.  BELOW ARE SYMPTOMS THAT SHOULD BE REPORTED IMMEDIATELY: *FEVER GREATER THAN 100.4 F (38 C) OR HIGHER *CHILLS OR SWEATING *NAUSEA AND VOMITING THAT IS NOT CONTROLLED WITH YOUR NAUSEA MEDICATION *UNUSUAL SHORTNESS OF BREATH *UNUSUAL BRUISING OR BLEEDING *URINARY PROBLEMS (pain or burning when urinating, or frequent urination) *BOWEL PROBLEMS (unusual diarrhea, constipation, pain near the anus) TENDERNESS IN MOUTH AND THROAT WITH OR WITHOUT PRESENCE OF ULCERS (sore throat, sores in mouth, or a toothache) UNUSUAL RASH, SWELLING OR PAIN  UNUSUAL VAGINAL DISCHARGE OR ITCHING   Items with * indicate a potential emergency and should be followed up as soon as possible or go to the Emergency Department if any problems should occur.  Please show the CHEMOTHERAPY ALERT CARD or  IMMUNOTHERAPY ALERT CARD at check-in to the Emergency Department and triage nurse.  Should you have questions after your visit or need to cancel or reschedule your appointment, please contact [facility] [place] [phone]  and follow the prompts.  Office hours are 8:00 a.m. to 4:30 p.m. Monday - Friday. Please note that voicemails left after 4:00 p.m. may not be returned until the following business day.  We are closed weekends and major holidays. You have access to a nurse at all times for urgent questions. Please call the main number to the clinic [phone] and follow the prompts.  For any non-urgent questions, you may also contact your provider using MyChart. We now offer e-Visits for anyone 18 and older to request care online for non-urgent symptoms. For details visit [email or web address].   Also download the MyChart app! Go to the app store, search "MyChart", open the app, select [place], and log in with your MyChart username and password.   

## 2023-02-09 NOTE — Progress Notes (Signed)
Patient tolerated injection with no complaints voiced.  Site clean and dry with no bruising or swelling noted at site.  See MAR for details.  Band aid applied.  Patient stable during and after injection.  Vss with discharge and left in satisfactory condition with no s/s of distress noted.  

## 2023-03-01 ENCOUNTER — Other Ambulatory Visit: Payer: Self-pay

## 2023-03-01 DIAGNOSIS — D469 Myelodysplastic syndrome, unspecified: Secondary | ICD-10-CM

## 2023-03-02 ENCOUNTER — Other Ambulatory Visit: Payer: Self-pay

## 2023-03-02 ENCOUNTER — Inpatient Hospital Stay: Payer: 59

## 2023-03-02 VITALS — BP 140/75 | HR 68 | Temp 97.3°F | Resp 18 | Ht 67.0 in | Wt 240.6 lb

## 2023-03-02 DIAGNOSIS — D649 Anemia, unspecified: Secondary | ICD-10-CM

## 2023-03-02 DIAGNOSIS — D469 Myelodysplastic syndrome, unspecified: Secondary | ICD-10-CM

## 2023-03-02 DIAGNOSIS — Z79899 Other long term (current) drug therapy: Secondary | ICD-10-CM | POA: Diagnosis not present

## 2023-03-02 DIAGNOSIS — D75839 Thrombocytosis, unspecified: Secondary | ICD-10-CM | POA: Diagnosis not present

## 2023-03-02 DIAGNOSIS — D461 Refractory anemia with ring sideroblasts: Secondary | ICD-10-CM | POA: Diagnosis not present

## 2023-03-02 LAB — FERRITIN: Ferritin: 1489 ng/mL — ABNORMAL HIGH (ref 24–336)

## 2023-03-02 LAB — IRON AND TIBC
Iron: 93 ug/dL (ref 45–182)
Saturation Ratios: 42 % — ABNORMAL HIGH (ref 17.9–39.5)
TIBC: 221 ug/dL — ABNORMAL LOW (ref 250–450)
UIBC: 128 ug/dL

## 2023-03-02 LAB — CBC
HCT: 31.7 % — ABNORMAL LOW (ref 39.0–52.0)
Hemoglobin: 10.7 g/dL — ABNORMAL LOW (ref 13.0–17.0)
MCH: 32.4 pg (ref 26.0–34.0)
MCHC: 33.8 g/dL (ref 30.0–36.0)
MCV: 96.1 fL (ref 80.0–100.0)
Platelets: 542 10*3/uL — ABNORMAL HIGH (ref 150–400)
RBC: 3.3 MIL/uL — ABNORMAL LOW (ref 4.22–5.81)
RDW: 27.2 % — ABNORMAL HIGH (ref 11.5–15.5)
WBC: 6.6 10*3/uL (ref 4.0–10.5)
nRBC: 0.5 % — ABNORMAL HIGH (ref 0.0–0.2)

## 2023-03-02 MED ORDER — LUSPATERCEPT-AAMT 75 MG ~~LOC~~ SOLR
0.9500 mg/kg | Freq: Once | SUBCUTANEOUS | Status: AC
  Administered 2023-03-02: 100 mg via SUBCUTANEOUS
  Filled 2023-03-02: qty 2

## 2023-03-02 NOTE — Patient Instructions (Signed)
[facility]  Discharge Instructions: Thank you for choosing [facility] to provide your oncology and hematology care.  If you have a lab appointment with the Cancer Center - please note that after April 8th, 2024, all labs will be drawn in the cancer center.  You do not have to check in or register with the main entrance as you have in the past but will complete your check-in in the cancer center.  Wear comfortable clothing and clothing appropriate for easy access to any Portacath or PICC line.   We strive to give you quality time with your provider. You may need to reschedule your appointment if you arrive late (15 or more minutes).  Arriving late affects you and other patients whose appointments are after yours.  Also, if you miss three or more appointments without notifying the office, you may be dismissed from the clinic at the provider's discretion.      For prescription refill requests, have your pharmacy contact our office and allow 72 hours for refills to be completed.    Today you received the following chemotherapy and/or immunotherapy agents Reblozyl    To help prevent nausea and vomiting after your treatment, we encourage you to take your nausea medication as directed.  BELOW ARE SYMPTOMS THAT SHOULD BE REPORTED IMMEDIATELY: *FEVER GREATER THAN 100.4 F (38 C) OR HIGHER *CHILLS OR SWEATING *NAUSEA AND VOMITING THAT IS NOT CONTROLLED WITH YOUR NAUSEA MEDICATION *UNUSUAL SHORTNESS OF BREATH *UNUSUAL BRUISING OR BLEEDING *URINARY PROBLEMS (pain or burning when urinating, or frequent urination) *BOWEL PROBLEMS (unusual diarrhea, constipation, pain near the anus) TENDERNESS IN MOUTH AND THROAT WITH OR WITHOUT PRESENCE OF ULCERS (sore throat, sores in mouth, or a toothache) UNUSUAL RASH, SWELLING OR PAIN  UNUSUAL VAGINAL DISCHARGE OR ITCHING   Items with * indicate a potential emergency and should be followed up as soon as possible or go to the  Emergency Department if any problems should occur.  Please show the CHEMOTHERAPY ALERT CARD or IMMUNOTHERAPY ALERT CARD at check-in to the Emergency Department and triage nurse.  Should you have questions after your visit or need to cancel or reschedule your appointment, please contact [facility] AT [facility] [phone]  and follow the prompts.  Office hours are 8:00 a.m. to 4:30 p.m. Monday - Friday. Please note that voicemails left after 4:00 p.m. may not be returned until the following business day.  We are closed weekends and major holidays. You have access to a nurse at all times for urgent questions. Please call the main number to the clinic [phone] and follow the prompts.  For any non-urgent questions, you may also contact your provider using MyChart. We now offer e-Visits for anyone 75 and older to request care online for non-urgent symptoms. For details visit [email or web address].   Also download the MyChart app! Go to the app store, search "MyChart", open the app, select [place], and log in with your MyChart username and password.

## 2023-03-02 NOTE — Progress Notes (Signed)
Patient presents today for Reblozyl injection per providers order.  Vital signs WNL.  Hbg 10.7.  Patient has no new complaints at this time.

## 2023-03-19 ENCOUNTER — Other Ambulatory Visit: Payer: Self-pay

## 2023-03-22 ENCOUNTER — Other Ambulatory Visit: Payer: Self-pay

## 2023-03-22 DIAGNOSIS — D469 Myelodysplastic syndrome, unspecified: Secondary | ICD-10-CM

## 2023-03-22 NOTE — Progress Notes (Signed)
[facility] [address], [place] [address]    Clinic Day:  03/22/2023  Referring physician: Elisabeth Cara, PA  Patient Care Team: Elisabeth Cara, [place] as PCP - General (Family Medicine) Doreatha Massed, MD as Medical Oncologist (Hematology)   ASSESSMENT & PLAN:   Assessment: 1. MDS with ringed sideroblasts and thrombocytosis: - Patient seen for normocytic anemia with CBC on 05/08/2021 showing hemoglobin 8.4, MCV 97 and platelet count 631. - Ferritin was 1454, percent saturation was 85.  Hemochromatosis was negative. - He is taking iron tablet 2 times daily for the last 1 year. - Colonoscopy was reportedly done in [place] 3 years ago, within normal limits. - BMBX (10/08/2021): Hypercellular marrow with erythroid hyperplasia, erythroid dysplasia and ring sideroblasts.  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 - IPSS M: Score is -0.73 (low risk).  IPSS-R score 2.5.  Median leukemia free survival was 5.9%.  Median overall survival was 6 years.  AML transformation rate was 1.7 %/year. - Ring sideroblasts were more than 15%.  JAK2 V617F and reflex testing and BCR/ABL by FISH were negative for thrombocytosis. - Serum EPO was 107. - Retacrit 30,000 units weekly started on 11/26/2021, dose changed to 40,000 units every 2 weeks on 12/31/2021, dose increased to 60,000 units weekly on 03/01/2022.  Last dose of Retacrit on 06/22/2022.  Luspatercept 1 mg/kg started on 06/15/2022.   2. 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 - Denies any smoking history. - Paternal aunt had pancreatic cancer.  Sister had breast cancer.    Plan: 1. MDS with ring sideroblasts and thrombocytosis: - Stool for occult blood was negative on 09/09/2022. - He is tolerating Luspatercept very well. - Hemoglobin  today is 10.9.  Ferritin is 1400 and percent saturation 84.  B12 is 519. - Recommend increasing Luspatercept to 1 mg/kg dose.  Continue Luspatercept every 3 weeks.  RTC 12 weeks for follow-up. - As he is complaining of severe fatigue, will check TSH also.  2.  Elevated ferritin (hemochromatosis negative): - Ferritin 1406 and percent saturation 84, slightly improved from last time.  Continue close monitoring.  3.  Hypertension: - Continue lisinopril, HCTZ and Norvasc.  Blood pressure is well-controlled today.    No orders of the defined types were placed in this encounter.      Alben Deeds Teague,acting as a Neurosurgeon for Doreatha Massed, MD.,have documented all relevant documentation on the behalf of Doreatha Massed, MD,as directed by  Doreatha Massed, MD while in the presence of Doreatha Massed, MD.  ***   Strathmore R Teague   8/13/202410:19 PM  CHIEF COMPLAINT:   Diagnosis: normocytic anemia from MDS    Cancer Staging  No matching staging information was found for the patient.    Prior Therapy: Retacrit 60,000 units weekly   Current Therapy:  Luspatercept every 3 weeks    HISTORY OF PRESENT ILLNESS:   Oncology History  MDS (myelodysplastic syndrome) (HCC)  11/14/2021 Initial Diagnosis   MDS (myelodysplastic syndrome) (HCC)   06/15/2022 -  Chemotherapy   Patient is on Treatment Plan : MYELODYSPLASIA Luspatercept q21d        INTERVAL HISTORY:   Stephen Watts is a 62 y.o. male presenting to clinic today for follow up of normocytic anemia from MDS. He was last seen by me on 01/19/23.  Today,  he states that he is doing well overall. His appetite level is at ***%. His energy level is at ***%.  PAST MEDICAL HISTORY:   Past Medical History: Past Medical History:  Diagnosis Date   Anemia    Diabetes (HCC)    Hypertension     Surgical History: Past Surgical History:  Procedure Laterality Date   APPENDECTOMY     age 53 years old    Social History: Social  History   Socioeconomic History   Marital status: Widowed    Spouse name: Not on file   Number of children: Not on file   Years of education: Not on file   Highest education level: Not on file  Occupational History   Not on file  Tobacco Use   Smoking status: Never    Passive exposure: Never   Smokeless tobacco: Never  Substance and Sexual Activity   Alcohol use: Never   Drug use: Never   Sexual activity: Not on file  Other Topics Concern   Not on file  Social History Narrative   Not on file   Social Determinants of Health   Financial Resource Strain: Not on file  Food Insecurity: Not on file  Transportation Needs: Not on file  Physical Activity: Not on file  Stress: Not on file  Social Connections: Not on file  Intimate Partner Violence: Not on file    Family History: Family History  Problem Relation Age of Onset   Heart disease Mother    Diabetes Sister     Current Medications:  Current Outpatient Medications:    amLODipine (NORVASC) 10 MG tablet, Take 10 mg by mouth daily., Disp: , Rfl:    ferrous sulfate 325 (65 FE) MG tablet, Take 325 mg by mouth 2 (two) times a week., Disp: , Rfl:    glimepiride (AMARYL) 2 MG tablet, Take 2 mg by mouth daily with breakfast., Disp: , Rfl:    hydrochlorothiazide (HYDRODIURIL) 25 MG tablet, Take 25 mg by mouth daily., Disp: , Rfl:    levothyroxine (SYNTHROID) 125 MCG tablet, TAKE 1 TABLET BY MOUTH MONDAY THRU SATURDAY, Disp: , Rfl:    lisinopril (ZESTRIL) 40 MG tablet, Take 40 mg by mouth daily., Disp: , Rfl:    metFORMIN (GLUCOPHAGE) 1000 MG tablet, Take 1,000 mg by mouth 2 (two) times daily., Disp: , Rfl:    metoprolol succinate (TOPROL-XL) 50 MG 24 hr tablet, Take 50 mg by mouth daily., Disp: , Rfl:    polyethylene glycol-electrolytes (NULYTELY) 420 g solution, Take 4,000 mLs by mouth once., Disp: , Rfl:    sildenafil (VIAGRA) 50 MG tablet, Take 50 mg by mouth daily as needed., Disp: , Rfl:    sitaGLIPtin (JANUVIA) 100 MG  tablet, Take 100 mg by mouth daily., Disp: , Rfl:  No current facility-administered medications for this visit.  Facility-Administered Medications Ordered in Other Visits:    acetaminophen (TYLENOL) 325 MG tablet, , , ,    loratadine (CLARITIN) 10 MG tablet, , , ,    Allergies: Allergies  Allergen Reactions   Ibuprofen Hives   Sulfa Antibiotics Rash    REVIEW OF SYSTEMS:   Review of Systems  Constitutional:  Negative for chills, fatigue and fever.  HENT:   Negative for lump/mass, mouth sores, nosebleeds, sore throat and trouble swallowing.   Eyes:  Negative for eye problems.  Respiratory:  Negative for cough and shortness of breath.   Cardiovascular:  Negative for chest pain, leg swelling and palpitations.  Gastrointestinal:  Negative  for abdominal pain, constipation, diarrhea, nausea and vomiting.  Genitourinary:  Negative for bladder incontinence, difficulty urinating, dysuria, frequency, hematuria and nocturia.   Musculoskeletal:  Negative for arthralgias, back pain, flank pain, myalgias and neck pain.  Skin:  Negative for itching and rash.  Neurological:  Negative for dizziness, headaches and numbness.  Hematological:  Does not bruise/bleed easily.  Psychiatric/Behavioral:  Negative for depression, sleep disturbance and suicidal ideas. The patient is not nervous/anxious.   All other systems reviewed and are negative.    VITALS:   There were no vitals taken for this visit.  Wt Readings from Last 3 Encounters:  03/02/23 240 lb 9.6 oz (109.1 kg)  01/19/23 242 lb 9.6 oz (110 kg)  12/29/22 239 lb (108.4 kg)    There is no height or weight on file to calculate BMI.  Performance status (ECOG): 1 - Symptomatic but completely ambulatory  PHYSICAL EXAM:   Physical Exam Vitals and nursing note reviewed. Exam conducted with a chaperone present.  Constitutional:      Appearance: Normal appearance.  Cardiovascular:     Rate and Rhythm: Normal rate and regular rhythm.      Pulses: Normal pulses.     Heart sounds: Normal heart sounds.  Pulmonary:     Effort: Pulmonary effort is normal.     Breath sounds: Normal breath sounds.  Abdominal:     Palpations: Abdomen is soft. There is no hepatomegaly, splenomegaly or mass.     Tenderness: There is no abdominal tenderness.  Musculoskeletal:     Right lower leg: No edema.     Left lower leg: No edema.  Lymphadenopathy:     Cervical: No cervical adenopathy.     Right cervical: No superficial, deep or posterior cervical adenopathy.    Left cervical: No superficial, deep or posterior cervical adenopathy.     Upper Body:     Right upper body: No supraclavicular or axillary adenopathy.     Left upper body: No supraclavicular or axillary adenopathy.  Neurological:     General: No focal deficit present.     Mental Status: He is alert and oriented to person, place, and time.  Psychiatric:        Mood and Affect: Mood normal.        Behavior: Behavior normal.     LABS:      Latest Ref Rng & Units 03/02/2023   10:06 AM 02/09/2023   10:45 AM 01/19/2023   10:34 AM  CBC  WBC 4.0 - 10.5 K/uL 6.6  9.0  8.1   Hemoglobin 13.0 - 17.0 g/dL 29.5  62.1  30.8   Hematocrit 39.0 - 52.0 % 31.7  33.3  32.8   Platelets 150 - 400 K/uL 542  552  501       Latest Ref Rng & Units 10/06/2022   10:35 AM 09/09/2022   11:50 AM 06/08/2022   11:17 AM  CMP  Glucose 70 - 99 mg/dL 657  89  846   BUN 8 - 23 mg/dL 15  17  15    Creatinine 0.61 - 1.24 mg/dL 9.62  9.52  8.41   Sodium 135 - 145 mmol/L 132  137  139   Potassium 3.5 - 5.1 mmol/L 3.8  3.6  3.5   Chloride 98 - 111 mmol/L 101  103  105   CO2 22 - 32 mmol/L 26  26  26    Calcium 8.9 - 10.3 mg/dL 8.7  9.0  9.3   Total  Protein 6.5 - 8.1 g/dL 7.8  7.9  7.6   Total Bilirubin 0.3 - 1.2 mg/dL 1.3  0.9  1.1   Alkaline Phos 38 - 126 U/L 87  81  73   AST 15 - 41 U/L 34  25  28   ALT 0 - 44 U/L 43  30  37      No results found for: "CEA1", "CEA" / No results found for: "CEA1",  "CEA" No results found for: "PSA1" No results found for: "XLK440" No results found for: "CAN125"  Lab Results  Component Value Date   TOTALPROTELP 8.2 09/11/2021   ALBUMINELP 4.6 (H) 09/11/2021   A1GS 0.2 09/11/2021   A2GS 0.5 09/11/2021   BETS 1.0 09/11/2021   GAMS 1.9 (H) 09/11/2021   MSPIKE Not Observed 09/11/2021   SPEI Comment 09/11/2021   Lab Results  Component Value Date   TIBC 221 (L) 03/02/2023   TIBC 190 (L) 02/09/2023   TIBC 214 (L) 12/29/2022   FERRITIN 1,489 (H) 03/02/2023   FERRITIN 1,253 (H) 02/09/2023   FERRITIN 1,406 (H) 12/29/2022   IRONPCTSAT 42 (H) 03/02/2023   IRONPCTSAT 56 (H) 02/09/2023   IRONPCTSAT 84 (H) 12/29/2022   Lab Results  Component Value Date   LDH 142 06/08/2022   LDH 174 04/05/2022   LDH 160 09/11/2021     STUDIES:   No results found.

## 2023-03-23 ENCOUNTER — Inpatient Hospital Stay: Payer: 59 | Attending: Hematology | Admitting: Hematology

## 2023-03-23 ENCOUNTER — Inpatient Hospital Stay: Payer: 59

## 2023-03-23 DIAGNOSIS — D469 Myelodysplastic syndrome, unspecified: Secondary | ICD-10-CM

## 2023-03-23 DIAGNOSIS — Z882 Allergy status to sulfonamides status: Secondary | ICD-10-CM | POA: Insufficient documentation

## 2023-03-23 DIAGNOSIS — D75839 Thrombocytosis, unspecified: Secondary | ICD-10-CM | POA: Diagnosis not present

## 2023-03-23 DIAGNOSIS — Z8249 Family history of ischemic heart disease and other diseases of the circulatory system: Secondary | ICD-10-CM | POA: Insufficient documentation

## 2023-03-23 DIAGNOSIS — Z79899 Other long term (current) drug therapy: Secondary | ICD-10-CM | POA: Insufficient documentation

## 2023-03-23 DIAGNOSIS — Z803 Family history of malignant neoplasm of breast: Secondary | ICD-10-CM | POA: Diagnosis not present

## 2023-03-23 DIAGNOSIS — R7989 Other specified abnormal findings of blood chemistry: Secondary | ICD-10-CM | POA: Diagnosis not present

## 2023-03-23 DIAGNOSIS — I10 Essential (primary) hypertension: Secondary | ICD-10-CM | POA: Diagnosis not present

## 2023-03-23 DIAGNOSIS — D649 Anemia, unspecified: Secondary | ICD-10-CM

## 2023-03-23 DIAGNOSIS — Z833 Family history of diabetes mellitus: Secondary | ICD-10-CM | POA: Insufficient documentation

## 2023-03-23 DIAGNOSIS — Z800 Family history of malignant neoplasm of digestive organs: Secondary | ICD-10-CM | POA: Insufficient documentation

## 2023-03-23 DIAGNOSIS — R531 Weakness: Secondary | ICD-10-CM | POA: Insufficient documentation

## 2023-03-23 DIAGNOSIS — D461 Refractory anemia with ring sideroblasts: Secondary | ICD-10-CM | POA: Insufficient documentation

## 2023-03-23 DIAGNOSIS — Z886 Allergy status to analgesic agent status: Secondary | ICD-10-CM | POA: Insufficient documentation

## 2023-03-23 DIAGNOSIS — Z9049 Acquired absence of other specified parts of digestive tract: Secondary | ICD-10-CM | POA: Insufficient documentation

## 2023-03-23 LAB — IRON AND TIBC
Iron: 167 ug/dL (ref 45–182)
Saturation Ratios: 85 % — ABNORMAL HIGH (ref 17.9–39.5)
TIBC: 196 ug/dL — ABNORMAL LOW (ref 250–450)
UIBC: 29 ug/dL

## 2023-03-23 LAB — CBC
HCT: 33.5 % — ABNORMAL LOW (ref 39.0–52.0)
Hemoglobin: 11.1 g/dL — ABNORMAL LOW (ref 13.0–17.0)
MCH: 31.6 pg (ref 26.0–34.0)
MCHC: 33.1 g/dL (ref 30.0–36.0)
MCV: 95.4 fL (ref 80.0–100.0)
Platelets: 479 10*3/uL — ABNORMAL HIGH (ref 150–400)
RBC: 3.51 MIL/uL — ABNORMAL LOW (ref 4.22–5.81)
RDW: 26.6 % — ABNORMAL HIGH (ref 11.5–15.5)
WBC: 6.7 10*3/uL (ref 4.0–10.5)
nRBC: 0.3 % — ABNORMAL HIGH (ref 0.0–0.2)

## 2023-03-23 LAB — FERRITIN: Ferritin: 1486 ng/mL — ABNORMAL HIGH (ref 24–336)

## 2023-03-23 MED ORDER — LUSPATERCEPT-AAMT 75 MG ~~LOC~~ SOLR
0.9500 mg/kg | Freq: Once | SUBCUTANEOUS | Status: AC
  Administered 2023-03-23: 100 mg via SUBCUTANEOUS
  Filled 2023-03-23: qty 1.5

## 2023-03-23 NOTE — Patient Instructions (Signed)
[facility] at [facility] Discharge Instructions   You were seen and examined today by Dr. Ellin Saba.  He reviewed the results of your lab work which are normal/stable.   We will proceed with your Reblozyl injection today.   Return as scheduled.    Thank you for choosing [facility] at [facility] to provide your oncology and hematology care.  To afford each patient quality time with our provider, please arrive at least 15 minutes before your scheduled appointment time.   If you have a lab appointment with the Cancer Center please come in thru the Main Entrance and check in at the main information desk.  You need to re-schedule your appointment should you arrive 10 or more minutes late.  We strive to give you quality time with our providers, and arriving late affects you and other patients whose appointments are after yours.  Also, if you no show three or more times for appointments you may be dismissed from the clinic at the providers discretion.     Again, thank you for choosing [facility]  Our hope is that these requests will decrease the amount of time that you wait before being seen by our physicians.       _____________________________________________________________  Should you have questions after your visit to [facility], please contact our office at [phone] and follow the prompts.  Our office hours are 8:00 a.m. and 4:30 p.m. Monday - Friday.  Please note that voicemails left after 4:00 p.m. may not be returned until the following business day.  We are closed weekends and major holidays.  You do have access to a nurse 24-7, just call the main number to the clinic [phone] and do not press any options, hold on the line and a nurse will answer the phone.    For prescription refill requests, have your pharmacy contact our office and allow 72 hours.    Due to Covid, you will need to wear a mask upon  entering the hospital. If you do not have a mask, a mask will be given to you at the Main Entrance upon arrival. For doctor visits, patients may have 1 support person age 65 or older with them. For treatment visits, patients can not have anyone with them due to social distancing guidelines and our immunocompromised population.

## 2023-03-23 NOTE — Progress Notes (Signed)
Patient tolerated injection with no complaints voiced.  Site clean and dry with no bruising or swelling noted at site.  See MAR for details.  Band aid applied.  Patient stable during and after injection.  Vss with discharge and left in satisfactory condition with no s/s of distress noted.  

## 2023-03-23 NOTE — Patient Instructions (Signed)

## 2023-03-24 ENCOUNTER — Other Ambulatory Visit: Payer: Self-pay

## 2023-04-05 ENCOUNTER — Other Ambulatory Visit: Payer: Self-pay

## 2023-04-13 ENCOUNTER — Inpatient Hospital Stay: Payer: 59

## 2023-04-13 ENCOUNTER — Inpatient Hospital Stay: Payer: 59 | Admitting: Hematology

## 2023-04-13 ENCOUNTER — Inpatient Hospital Stay: Payer: 59 | Attending: Hematology

## 2023-04-13 DIAGNOSIS — D461 Refractory anemia with ring sideroblasts: Secondary | ICD-10-CM | POA: Insufficient documentation

## 2023-04-13 DIAGNOSIS — D75839 Thrombocytosis, unspecified: Secondary | ICD-10-CM | POA: Insufficient documentation

## 2023-04-13 DIAGNOSIS — Z79899 Other long term (current) drug therapy: Secondary | ICD-10-CM | POA: Insufficient documentation

## 2023-04-13 DIAGNOSIS — D469 Myelodysplastic syndrome, unspecified: Secondary | ICD-10-CM

## 2023-04-13 LAB — CBC WITH DIFFERENTIAL/PLATELET
Abs Immature Granulocytes: 0.02 10*3/uL (ref 0.00–0.07)
Basophils Absolute: 0.1 10*3/uL (ref 0.0–0.1)
Basophils Relative: 1 %
Eosinophils Absolute: 0.1 10*3/uL (ref 0.0–0.5)
Eosinophils Relative: 1 %
HCT: 34.5 % — ABNORMAL LOW (ref 39.0–52.0)
Hemoglobin: 11.7 g/dL — ABNORMAL LOW (ref 13.0–17.0)
Immature Granulocytes: 0 %
Lymphocytes Relative: 44 %
Lymphs Abs: 3.2 10*3/uL (ref 0.7–4.0)
MCH: 32.5 pg (ref 26.0–34.0)
MCHC: 33.9 g/dL (ref 30.0–36.0)
MCV: 95.8 fL (ref 80.0–100.0)
Monocytes Absolute: 0.9 10*3/uL (ref 0.1–1.0)
Monocytes Relative: 12 %
Neutro Abs: 3.1 10*3/uL (ref 1.7–7.7)
Neutrophils Relative %: 42 %
Platelets: 406 10*3/uL — ABNORMAL HIGH (ref 150–400)
RBC: 3.6 MIL/uL — ABNORMAL LOW (ref 4.22–5.81)
RDW: 27.3 % — ABNORMAL HIGH (ref 11.5–15.5)
WBC: 7.3 10*3/uL (ref 4.0–10.5)
nRBC: 0.3 % — ABNORMAL HIGH (ref 0.0–0.2)

## 2023-04-13 NOTE — Progress Notes (Signed)
No Reblozyl per order parameters for Hgb of 11.7.

## 2023-05-04 ENCOUNTER — Inpatient Hospital Stay: Payer: 59

## 2023-05-04 VITALS — BP 133/72 | HR 80 | Temp 97.5°F | Resp 20

## 2023-05-04 DIAGNOSIS — D469 Myelodysplastic syndrome, unspecified: Secondary | ICD-10-CM

## 2023-05-04 DIAGNOSIS — Z79899 Other long term (current) drug therapy: Secondary | ICD-10-CM | POA: Diagnosis not present

## 2023-05-04 DIAGNOSIS — D461 Refractory anemia with ring sideroblasts: Secondary | ICD-10-CM | POA: Diagnosis not present

## 2023-05-04 DIAGNOSIS — D75839 Thrombocytosis, unspecified: Secondary | ICD-10-CM | POA: Diagnosis not present

## 2023-05-04 LAB — CBC WITH DIFFERENTIAL/PLATELET
Abs Immature Granulocytes: 0.01 10*3/uL (ref 0.00–0.07)
Basophils Absolute: 0.1 10*3/uL (ref 0.0–0.1)
Basophils Relative: 1 %
Eosinophils Absolute: 0.1 10*3/uL (ref 0.0–0.5)
Eosinophils Relative: 2 %
HCT: 33 % — ABNORMAL LOW (ref 39.0–52.0)
Hemoglobin: 10.6 g/dL — ABNORMAL LOW (ref 13.0–17.0)
Immature Granulocytes: 0 %
Lymphocytes Relative: 39 %
Lymphs Abs: 2.3 10*3/uL (ref 0.7–4.0)
MCH: 31.3 pg (ref 26.0–34.0)
MCHC: 32.1 g/dL (ref 30.0–36.0)
MCV: 97.3 fL (ref 80.0–100.0)
Monocytes Absolute: 0.9 10*3/uL (ref 0.1–1.0)
Monocytes Relative: 15 %
Neutro Abs: 2.4 10*3/uL (ref 1.7–7.7)
Neutrophils Relative %: 43 %
Platelets: 425 10*3/uL — ABNORMAL HIGH (ref 150–400)
RBC: 3.39 MIL/uL — ABNORMAL LOW (ref 4.22–5.81)
RDW: 27.7 % — ABNORMAL HIGH (ref 11.5–15.5)
WBC: 5.8 10*3/uL (ref 4.0–10.5)
nRBC: 0.7 % — ABNORMAL HIGH (ref 0.0–0.2)

## 2023-05-04 MED ORDER — LUSPATERCEPT-AAMT 75 MG ~~LOC~~ SOLR
0.9500 mg/kg | Freq: Once | SUBCUTANEOUS | Status: AC
  Administered 2023-05-04: 100 mg via SUBCUTANEOUS
  Filled 2023-05-04: qty 1.5

## 2023-05-04 NOTE — Progress Notes (Signed)
Patient in clinic today for Reblozyl injection. Hemoglobin 10.6 and injection given. See MAR for injection information.Patient remained stable throughout injection. Patient discharged from clinic ambulatory and in stable condition.

## 2023-05-05 ENCOUNTER — Encounter: Payer: Self-pay | Admitting: Hematology

## 2023-05-05 NOTE — Progress Notes (Signed)
Orders for labs entered.

## 2023-05-16 IMAGING — CT CT BIOPSY
1 of 3 series · 15 of 28 positions shown, 19 images · non-contrast
Comparison: none

INDICATION: Evaluate for MDS. Please perform CT-guided bone marrow biopsy for
tissue diagnostic purposes.

[Series 2: i-spiral 5.0 br40 · axial · 0.98mm/px · z∈[+1298,+1365]mm · 15 of 23 slices shown, 19 images]
[im 2/23  mediastinal]
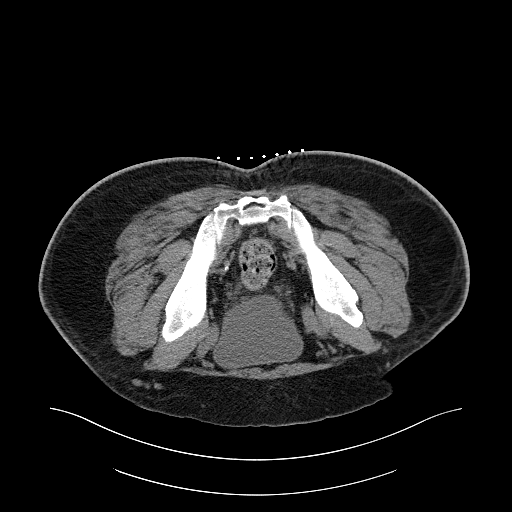
[im 2/23  lung]
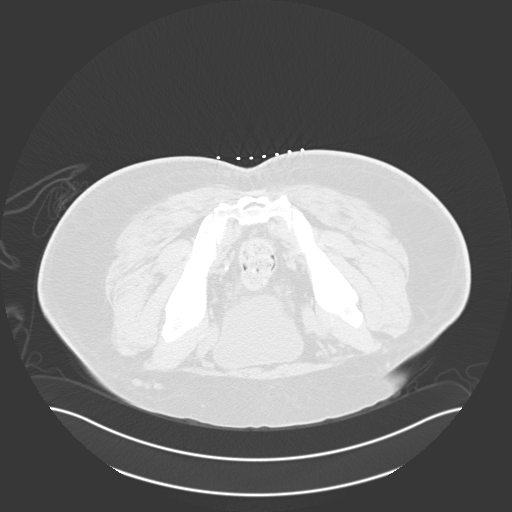
[im 4/23  lung]
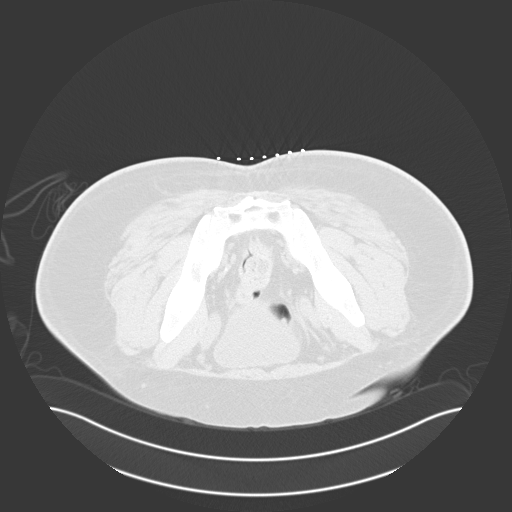
[im 5/23  lung]
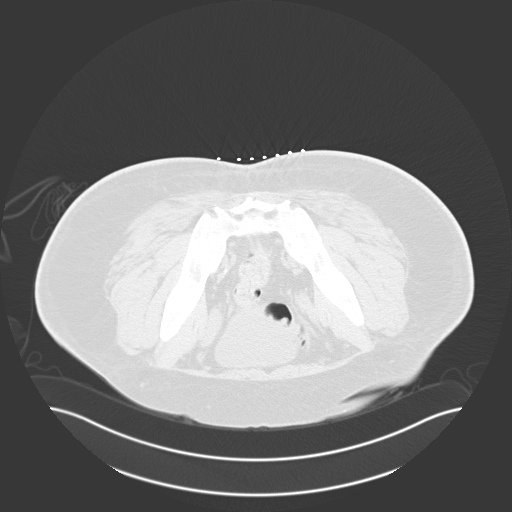
[im 6/23  lung]
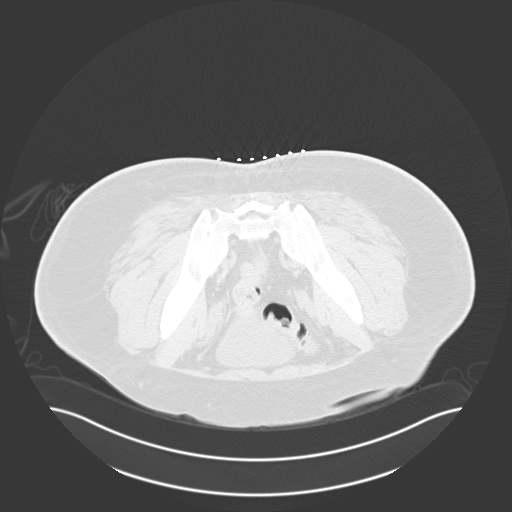
[im 8/23  mediastinal]
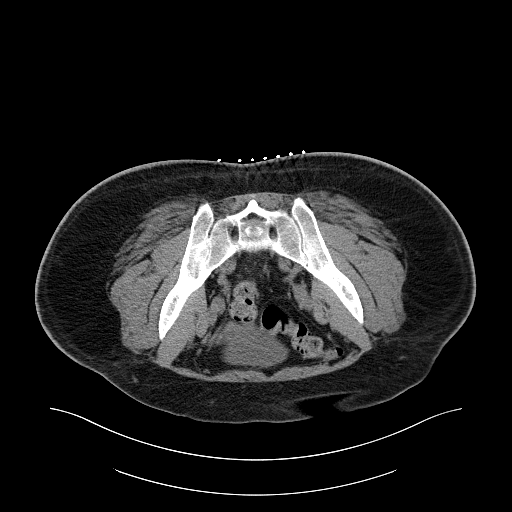
[im 8/23  lung]
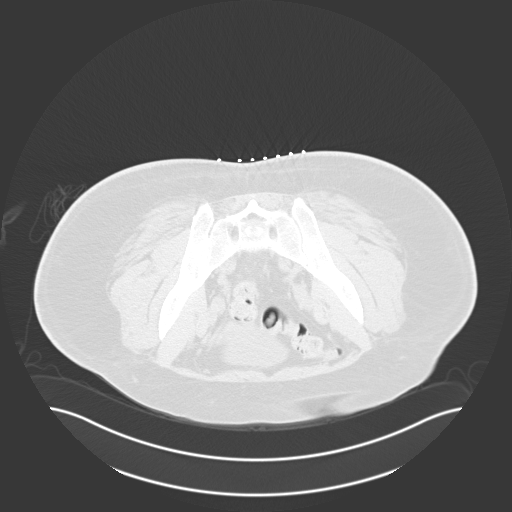
[im 9/23  lung]
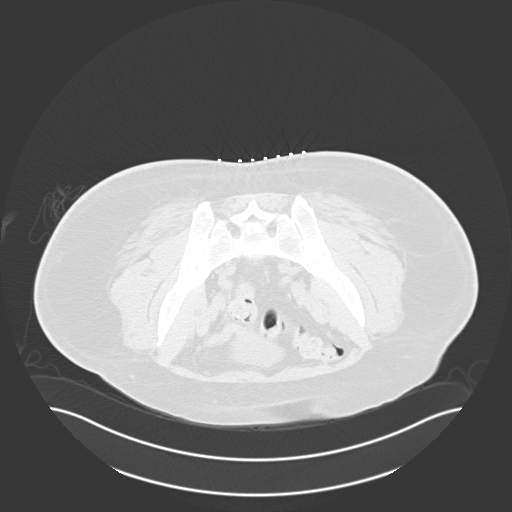
[im 10/23  lung]
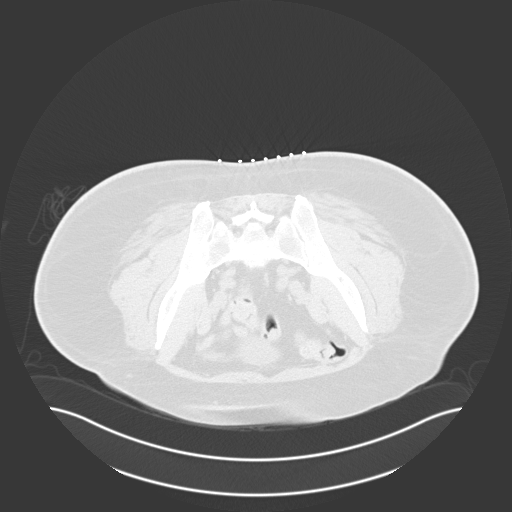
[im 12/23  lung]
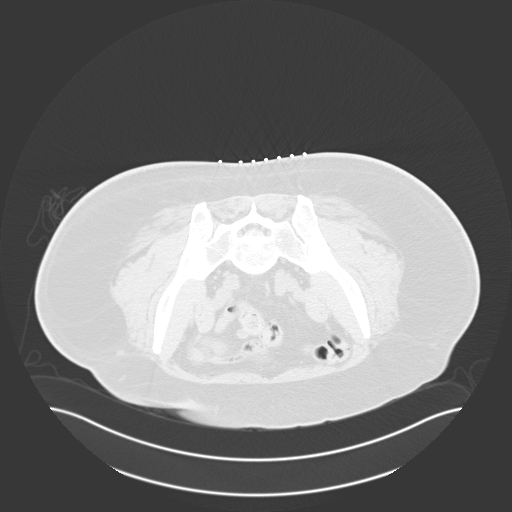
[im 13/23  mediastinal]
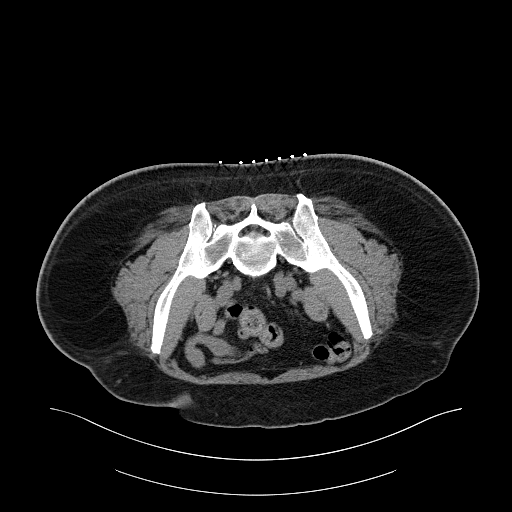
[im 13/23  lung]
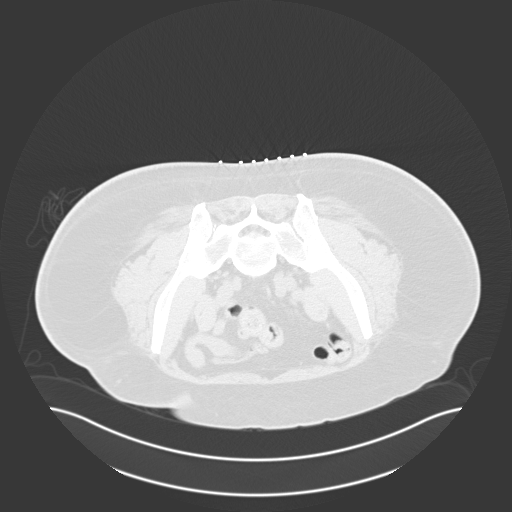
[im 14/23  lung]
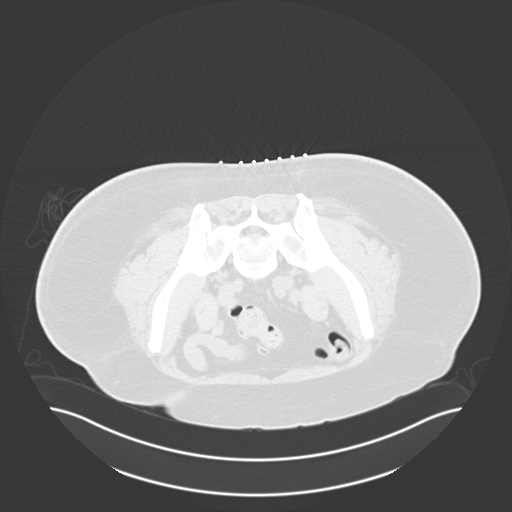
[im 16/23  lung]
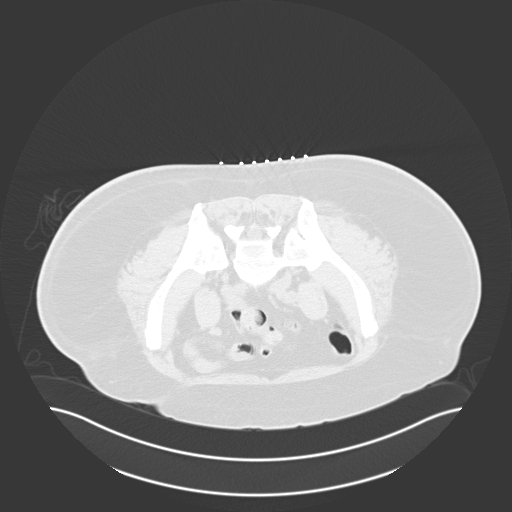
[im 17/23  lung]
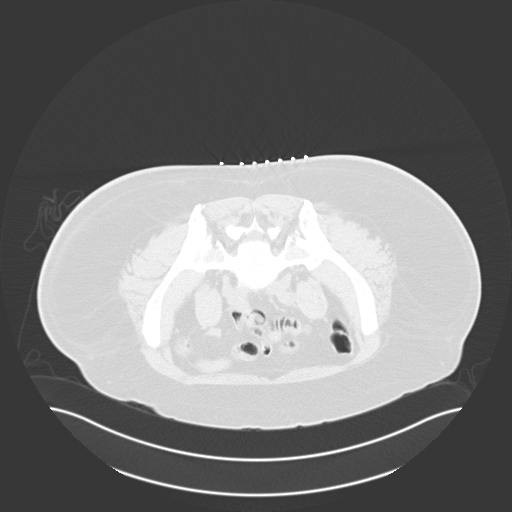
[im 18/23  mediastinal]
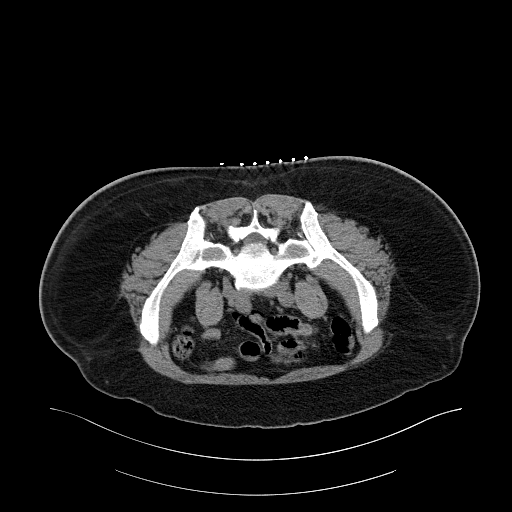
[im 18/23  lung]
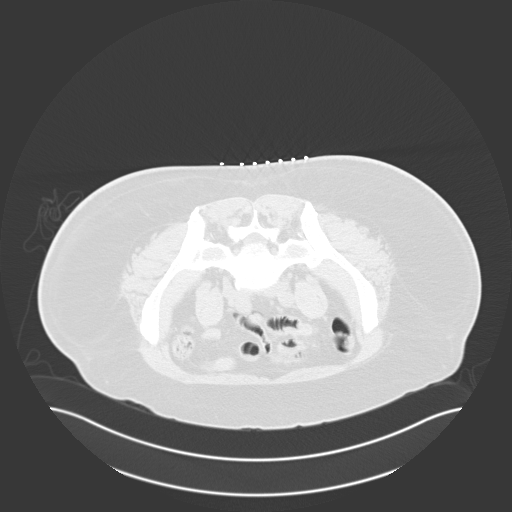
[im 20/23  lung]
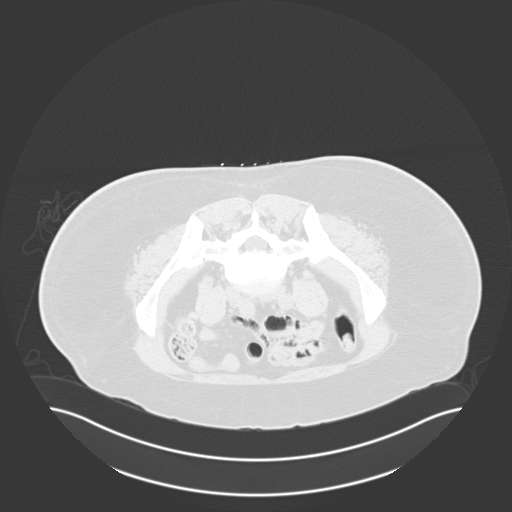
[im 21/23  lung]
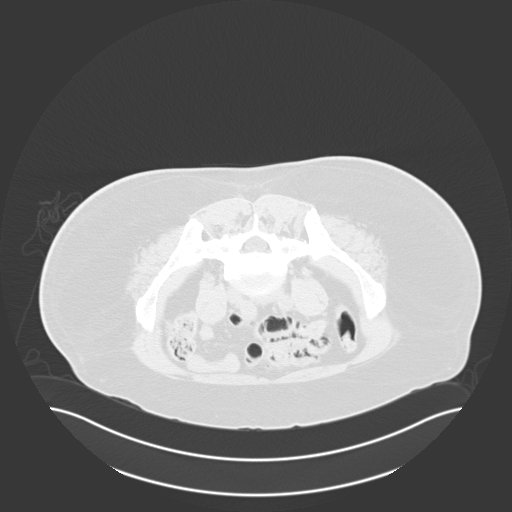

[15 of 28 positions shown; findings below may reference images not displayed]

EXAM:
CT-GUIDED BONE MARROW BIOPSY AND ASPIRATION

MEDICATIONS:
None

ANESTHESIA/SEDATION:
Moderate (conscious) sedation was employed during this procedure as
administered by the [REDACTED].

A total of Versed 3 mg and Fentanyl 100 mcg was administered
intravenously.

Moderate Sedation Time: 10 minutes. The patient's level of
consciousness and vital signs were monitored continuously by
radiology nursing throughout the procedure under my direct
supervision.

COMPLICATIONS:
None immediate.

PROCEDURE:
Informed consent was obtained from the patient following an
explanation of the procedure, risks, benefits and alternatives. The
patient understands, agrees and consents for the procedure. All
questions were addressed. A time out was performed prior to the
initiation of the procedure.

The patient was positioned prone and non-contrast localization CT
was performed of the pelvis to demonstrate the iliac marrow spaces.
The operative site was prepped and draped in the usual sterile
fashion.

Under sterile conditions and local anesthesia, a 22 gauge spinal
needle was utilized for procedural planning. Next, an 11 gauge
coaxial bone biopsy needle was advanced into the left iliac marrow
space. Needle position was confirmed with CT imaging. Initially, a
bone marrow aspiration was performed. Next, a bone marrow biopsy was
obtained with the 11 gauge outer bone marrow device. Samples were
prepared with the cytotechnologist and deemed adequate. The needle
was removed and superficial hemostasis was obtained with manual
compression. A dressing was applied. The patient tolerated the
procedure well without immediate post procedural complication.
IMPRESSION: Successful CT guided left iliac bone marrow aspiration and core
biopsy.

## 2023-05-24 ENCOUNTER — Other Ambulatory Visit: Payer: Self-pay

## 2023-05-24 DIAGNOSIS — D469 Myelodysplastic syndrome, unspecified: Secondary | ICD-10-CM

## 2023-05-24 NOTE — Progress Notes (Signed)
Lab orders entered

## 2023-05-25 ENCOUNTER — Inpatient Hospital Stay: Payer: 59

## 2023-05-25 ENCOUNTER — Inpatient Hospital Stay: Payer: 59 | Attending: Hematology

## 2023-05-25 DIAGNOSIS — D461 Refractory anemia with ring sideroblasts: Secondary | ICD-10-CM | POA: Insufficient documentation

## 2023-05-25 DIAGNOSIS — Z79899 Other long term (current) drug therapy: Secondary | ICD-10-CM | POA: Insufficient documentation

## 2023-05-25 DIAGNOSIS — D469 Myelodysplastic syndrome, unspecified: Secondary | ICD-10-CM

## 2023-05-25 DIAGNOSIS — D75839 Thrombocytosis, unspecified: Secondary | ICD-10-CM | POA: Insufficient documentation

## 2023-05-25 LAB — CBC WITH DIFFERENTIAL/PLATELET
Abs Immature Granulocytes: 0.1 10*3/uL — ABNORMAL HIGH (ref 0.00–0.07)
Band Neutrophils: 2 %
Basophils Absolute: 0 10*3/uL (ref 0.0–0.1)
Basophils Relative: 0 %
Eosinophils Absolute: 0.2 10*3/uL (ref 0.0–0.5)
Eosinophils Relative: 3 %
HCT: 35.6 % — ABNORMAL LOW (ref 39.0–52.0)
Hemoglobin: 11.8 g/dL — ABNORMAL LOW (ref 13.0–17.0)
Lymphocytes Relative: 34 %
Lymphs Abs: 2.1 10*3/uL (ref 0.7–4.0)
MCH: 32.1 pg (ref 26.0–34.0)
MCHC: 33.1 g/dL (ref 30.0–36.0)
MCV: 96.7 fL (ref 80.0–100.0)
Monocytes Absolute: 0.6 10*3/uL (ref 0.1–1.0)
Monocytes Relative: 10 %
Myelocytes: 1 %
Neutro Abs: 3.2 10*3/uL (ref 1.7–7.7)
Neutrophils Relative %: 50 %
Platelets: 498 10*3/uL — ABNORMAL HIGH (ref 150–400)
RBC: 3.68 MIL/uL — ABNORMAL LOW (ref 4.22–5.81)
RDW: 28.1 % — ABNORMAL HIGH (ref 11.5–15.5)
WBC: 6.2 10*3/uL (ref 4.0–10.5)
nRBC: 2.6 % — ABNORMAL HIGH (ref 0.0–0.2)
nRBC: 3 /100{WBCs} — ABNORMAL HIGH

## 2023-05-25 LAB — FERRITIN: Ferritin: 1749 ng/mL — ABNORMAL HIGH (ref 24–336)

## 2023-05-25 LAB — IRON AND TIBC
Iron: 185 ug/dL — ABNORMAL HIGH (ref 45–182)
Saturation Ratios: 83 % — ABNORMAL HIGH (ref 17.9–39.5)
TIBC: 222 ug/dL — ABNORMAL LOW (ref 250–450)
UIBC: 37 ug/dL

## 2023-05-25 NOTE — Progress Notes (Signed)
Hemoglobin today is 11.8.  We will hold Reblozyl today per provider orders.

## 2023-06-08 DIAGNOSIS — Z23 Encounter for immunization: Secondary | ICD-10-CM | POA: Diagnosis not present

## 2023-06-08 DIAGNOSIS — Z0000 Encounter for general adult medical examination without abnormal findings: Secondary | ICD-10-CM | POA: Diagnosis not present

## 2023-06-08 DIAGNOSIS — D649 Anemia, unspecified: Secondary | ICD-10-CM | POA: Diagnosis not present

## 2023-06-08 DIAGNOSIS — E119 Type 2 diabetes mellitus without complications: Secondary | ICD-10-CM | POA: Diagnosis not present

## 2023-06-08 DIAGNOSIS — Z125 Encounter for screening for malignant neoplasm of prostate: Secondary | ICD-10-CM | POA: Diagnosis not present

## 2023-06-08 DIAGNOSIS — Z13220 Encounter for screening for lipoid disorders: Secondary | ICD-10-CM | POA: Diagnosis not present

## 2023-06-08 DIAGNOSIS — E1165 Type 2 diabetes mellitus with hyperglycemia: Secondary | ICD-10-CM | POA: Diagnosis not present

## 2023-06-08 DIAGNOSIS — I10 Essential (primary) hypertension: Secondary | ICD-10-CM | POA: Diagnosis not present

## 2023-06-08 DIAGNOSIS — Z6837 Body mass index (BMI) 37.0-37.9, adult: Secondary | ICD-10-CM | POA: Diagnosis not present

## 2023-06-14 ENCOUNTER — Other Ambulatory Visit: Payer: Self-pay

## 2023-06-14 DIAGNOSIS — D469 Myelodysplastic syndrome, unspecified: Secondary | ICD-10-CM

## 2023-06-14 DIAGNOSIS — D649 Anemia, unspecified: Secondary | ICD-10-CM

## 2023-06-14 NOTE — Progress Notes (Signed)
Lab orders entered

## 2023-06-15 ENCOUNTER — Inpatient Hospital Stay: Payer: 59

## 2023-06-15 ENCOUNTER — Inpatient Hospital Stay: Payer: 59 | Attending: Hematology | Admitting: Hematology

## 2023-06-15 VITALS — BP 144/72 | HR 65 | Temp 97.7°F | Resp 16 | Wt 239.5 lb

## 2023-06-15 DIAGNOSIS — Z9049 Acquired absence of other specified parts of digestive tract: Secondary | ICD-10-CM | POA: Insufficient documentation

## 2023-06-15 DIAGNOSIS — I10 Essential (primary) hypertension: Secondary | ICD-10-CM | POA: Insufficient documentation

## 2023-06-15 DIAGNOSIS — Z79899 Other long term (current) drug therapy: Secondary | ICD-10-CM | POA: Insufficient documentation

## 2023-06-15 DIAGNOSIS — Z833 Family history of diabetes mellitus: Secondary | ICD-10-CM | POA: Diagnosis not present

## 2023-06-15 DIAGNOSIS — D469 Myelodysplastic syndrome, unspecified: Secondary | ICD-10-CM | POA: Diagnosis not present

## 2023-06-15 DIAGNOSIS — D75839 Thrombocytosis, unspecified: Secondary | ICD-10-CM | POA: Diagnosis not present

## 2023-06-15 DIAGNOSIS — Z8249 Family history of ischemic heart disease and other diseases of the circulatory system: Secondary | ICD-10-CM | POA: Insufficient documentation

## 2023-06-15 DIAGNOSIS — Z882 Allergy status to sulfonamides status: Secondary | ICD-10-CM | POA: Insufficient documentation

## 2023-06-15 DIAGNOSIS — D649 Anemia, unspecified: Secondary | ICD-10-CM

## 2023-06-15 DIAGNOSIS — D461 Refractory anemia with ring sideroblasts: Secondary | ICD-10-CM | POA: Diagnosis not present

## 2023-06-15 DIAGNOSIS — Z800 Family history of malignant neoplasm of digestive organs: Secondary | ICD-10-CM | POA: Insufficient documentation

## 2023-06-15 DIAGNOSIS — Z886 Allergy status to analgesic agent status: Secondary | ICD-10-CM | POA: Insufficient documentation

## 2023-06-15 DIAGNOSIS — Z803 Family history of malignant neoplasm of breast: Secondary | ICD-10-CM | POA: Insufficient documentation

## 2023-06-15 DIAGNOSIS — R7989 Other specified abnormal findings of blood chemistry: Secondary | ICD-10-CM | POA: Diagnosis not present

## 2023-06-15 LAB — HEPATIC FUNCTION PANEL
ALT: 88 U/L — ABNORMAL HIGH (ref 0–44)
AST: 51 U/L — ABNORMAL HIGH (ref 15–41)
Albumin: 3.9 g/dL (ref 3.5–5.0)
Alkaline Phosphatase: 86 U/L (ref 38–126)
Bilirubin, Direct: 0.1 mg/dL (ref 0.0–0.2)
Indirect Bilirubin: 0.7 mg/dL (ref 0.3–0.9)
Total Bilirubin: 0.8 mg/dL (ref <1.2)
Total Protein: 8.1 g/dL (ref 6.5–8.1)

## 2023-06-15 LAB — CBC WITH DIFFERENTIAL/PLATELET
Abs Immature Granulocytes: 0.18 10*3/uL — ABNORMAL HIGH (ref 0.00–0.07)
Basophils Absolute: 0.1 10*3/uL (ref 0.0–0.1)
Basophils Relative: 1 %
Eosinophils Absolute: 0.2 10*3/uL (ref 0.0–0.5)
Eosinophils Relative: 2 %
HCT: 29.4 % — ABNORMAL LOW (ref 39.0–52.0)
Hemoglobin: 9.8 g/dL — ABNORMAL LOW (ref 13.0–17.0)
Immature Granulocytes: 2 %
Lymphocytes Relative: 33 %
Lymphs Abs: 2.8 10*3/uL (ref 0.7–4.0)
MCH: 33 pg (ref 26.0–34.0)
MCHC: 33.3 g/dL (ref 30.0–36.0)
MCV: 99 fL (ref 80.0–100.0)
Monocytes Absolute: 1 10*3/uL (ref 0.1–1.0)
Monocytes Relative: 12 %
Neutro Abs: 4.2 10*3/uL (ref 1.7–7.7)
Neutrophils Relative %: 50 %
Platelets: 521 10*3/uL — ABNORMAL HIGH (ref 150–400)
RBC: 2.97 MIL/uL — ABNORMAL LOW (ref 4.22–5.81)
RDW: 27.3 % — ABNORMAL HIGH (ref 11.5–15.5)
WBC: 8.5 10*3/uL (ref 4.0–10.5)
nRBC: 4.1 % — ABNORMAL HIGH (ref 0.0–0.2)

## 2023-06-15 LAB — IRON AND TIBC
Iron: 153 ug/dL (ref 45–182)
Saturation Ratios: 77 % — ABNORMAL HIGH (ref 17.9–39.5)
TIBC: 200 ug/dL — ABNORMAL LOW (ref 250–450)
UIBC: 47 ug/dL

## 2023-06-15 LAB — FERRITIN: Ferritin: 2198 ng/mL — ABNORMAL HIGH (ref 24–336)

## 2023-06-15 MED ORDER — LUSPATERCEPT-AAMT 75 MG ~~LOC~~ SOLR
0.9500 mg/kg | Freq: Once | SUBCUTANEOUS | Status: DC
  Filled 2023-06-15: qty 2

## 2023-06-15 MED ORDER — LUSPATERCEPT-AAMT 75 MG ~~LOC~~ SOLR
0.9500 mg/kg | Freq: Once | SUBCUTANEOUS | Status: AC
  Administered 2023-06-15: 100 mg via SUBCUTANEOUS
  Filled 2023-06-15: qty 0.5

## 2023-06-15 NOTE — Progress Notes (Signed)
[facility] [address], [place] [address]    Clinic Day:  06/15/2023  Referring physician: Elisabeth Cara, PA  Patient Care Team: Elisabeth Cara, [place] as PCP - General (Family Medicine) Doreatha Massed, MD as Medical Oncologist (Hematology)   ASSESSMENT & PLAN:   Assessment: 1. MDS with ringed sideroblasts and thrombocytosis: - Patient seen for normocytic anemia with CBC on 05/08/2021 showing hemoglobin 8.4, MCV 97 and platelet count 631. - Ferritin was 1454, percent saturation was 85.  Hemochromatosis was negative. - He is taking iron tablet 2 times daily for the last 1 year. - Colonoscopy was reportedly done in [place] 3 years ago, within normal limits. - BMBX (10/08/2021): Hypercellular marrow with erythroid hyperplasia, erythroid dysplasia and ring sideroblasts.  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 - IPSS M: Score is -0.73 (low risk).  IPSS-R score 2.5.  Median leukemia free survival was 5.9%.  Median overall survival was 6 years.  AML transformation rate was 1.7 %/year. - Ring sideroblasts were more than 15%.  JAK2 V617F and reflex testing and BCR/ABL by FISH were negative for thrombocytosis. - Serum EPO was 107. - Retacrit 30,000 units weekly started on 11/26/2021, dose changed to 40,000 units every 2 weeks on 12/31/2021, dose increased to 60,000 units weekly on 03/01/2022.  Last dose of Retacrit on 06/22/2022.  Luspatercept 1 mg/kg started on 06/15/2022.   2. 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 - Denies any smoking history. - Paternal aunt had pancreatic cancer.  Sister had breast cancer.    Plan: 1. MDS with ring sideroblasts and thrombocytosis: - Denies any bleeding per rectum or melena.  He is tolerating Luspatercept very well.  He did not require  Luspatercept couple of times as his hemoglobin shot up about 11.5. - He is able to perform all his day-to-day activities. - Hemoglobin today is 9.8 and hematocrit 29.4.  Elevated platelet count from MDS is stable. - Continue Luspatercept 1 mg/kg subcu every 3 weeks.  RTC 12 weeks for follow-up.  2.  Elevated ferritin (hemochromatosis negative): - Previous testing for hemochromatosis is negative. - Latest ferritin is elevated at 2198, percent saturation at 77.  LFTs today shows elevated AST and ALT. - Recommend MRI of the liver for hepatic iron concentration.  3.  Hypertension: - He will continue lisinopril, HCTZ and Norvasc.  Blood pressure is controlled well today.    Orders Placed This Encounter  Procedures   MR LIVER W WO CONTRAST    Standing Status:   Future    Standing Expiration Date:   06/14/2024    Order Specific Question:   If indicated for the ordered procedure, I authorize the administration of contrast media per Radiology protocol    Answer:   Yes    Order Specific Question:   What is the patient's sedation requirement?    Answer:   No Sedation    Order Specific Question:   Does the patient have a pacemaker or implanted devices?    Answer:   No    Order Specific Question:   Release to patient    Answer:   Immediate    Order Specific Question:   Preferred imaging location?    Answer:   [facility] (table limit [phone])   Hepatic function panel   CBC  Standing Status:   Future    Standing Expiration Date:   06/14/2024   Comprehensive metabolic panel    Standing Status:   Future    Standing Expiration Date:   06/14/2024   Iron and TIBC ([facility] ONLY)    Standing Status:   Future    Standing Expiration Date:   06/14/2024   Ferritin    Standing Status:   Future    Standing Expiration Date:   06/14/2024      I,Katie Daubenspeck,acting as a scribe for Doreatha Massed, MD.,have documented all relevant documentation on the behalf of  Doreatha Massed, MD,as directed by  Doreatha Massed, MD while in the presence of Doreatha Massed, MD.   I, Doreatha Massed MD, have reviewed the above documentation for accuracy and completeness, and I agree with the above.   Doreatha Massed, MD   11/6/20245:37 PM  CHIEF COMPLAINT:   Diagnosis: normocytic anemia from MDS    Cancer Staging  No matching staging information was found for the patient.    Prior Therapy: Retacrit 60,000 units weekly   Current Therapy:  Luspatercept every 3 weeks    HISTORY OF PRESENT ILLNESS:   Oncology History  MDS (myelodysplastic syndrome) (HCC)  11/14/2021 Initial Diagnosis   MDS (myelodysplastic syndrome) (HCC)   06/15/2022 -  Chemotherapy   Patient is on Treatment Plan : MYELODYSPLASIA Luspatercept q21d        INTERVAL HISTORY:   Stephen Watts is a 62 y.o. male presenting to clinic today for follow up of normocytic anemia from MDS. He was last seen by me on 03/23/23.  Today, he states that he is doing well overall. His appetite level is at 100%. His energy level is at 75%.  PAST MEDICAL HISTORY:   Past Medical History: Past Medical History:  Diagnosis Date   Anemia    Diabetes (HCC)    Hypertension     Surgical History: Past Surgical History:  Procedure Laterality Date   APPENDECTOMY     age 39 years old    Social History: Social History   Socioeconomic History   Marital status: Widowed    Spouse name: Not on file   Number of children: Not on file   Years of education: Not on file   Highest education level: Not on file  Occupational History   Not on file  Tobacco Use   Smoking status: Never    Passive exposure: Never   Smokeless tobacco: Never  Substance and Sexual Activity   Alcohol use: Never   Drug use: Never   Sexual activity: Not on file  Other Topics Concern   Not on file  Social History Narrative   Not on file   Social Determinants of Health   Financial Resource Strain: Not on file   Food Insecurity: Not on file  Transportation Needs: Not on file  Physical Activity: Not on file  Stress: Not on file  Social Connections: Not on file  Intimate Partner Violence: Not on file    Family History: Family History  Problem Relation Age of Onset   Heart disease Mother    Diabetes Sister     Current Medications:  Current Outpatient Medications:    amLODipine (NORVASC) 10 MG tablet, Take 10 mg by mouth daily., Disp: , Rfl:    ferrous sulfate 325 (65 FE) MG tablet, Take 325 mg by mouth 2 (two) times a week., Disp: , Rfl:    glimepiride (AMARYL) 2 MG tablet, Take 2 mg by mouth daily  with breakfast., Disp: , Rfl:    hydrochlorothiazide (HYDRODIURIL) 25 MG tablet, Take 25 mg by mouth daily., Disp: , Rfl:    levothyroxine (SYNTHROID) 125 MCG tablet, TAKE 1 TABLET BY MOUTH MONDAY THRU SATURDAY, Disp: , Rfl:    lisinopril (ZESTRIL) 40 MG tablet, Take 40 mg by mouth daily., Disp: , Rfl:    metFORMIN (GLUCOPHAGE) 1000 MG tablet, Take 1,000 mg by mouth 2 (two) times daily., Disp: , Rfl:    metoprolol succinate (TOPROL-XL) 50 MG 24 hr tablet, Take 50 mg by mouth daily., Disp: , Rfl:    polyethylene glycol-electrolytes (NULYTELY) 420 g solution, Take 4,000 mLs by mouth once., Disp: , Rfl:    sildenafil (VIAGRA) 50 MG tablet, Take 50 mg by mouth daily as needed., Disp: , Rfl:    sitaGLIPtin (JANUVIA) 100 MG tablet, Take 100 mg by mouth daily., Disp: , Rfl:  No current facility-administered medications for this visit.  Facility-Administered Medications Ordered in Other Visits:    acetaminophen (TYLENOL) 325 MG tablet, , , ,    loratadine (CLARITIN) 10 MG tablet, , , ,    Allergies: Allergies  Allergen Reactions   Ibuprofen Hives   Sulfa Antibiotics Rash    REVIEW OF SYSTEMS:   Review of Systems  Constitutional:  Negative for chills, fatigue and fever.  HENT:   Negative for lump/mass, mouth sores, nosebleeds, sore throat and trouble swallowing.   Eyes:  Negative for eye  problems.  Respiratory:  Negative for cough and shortness of breath.   Cardiovascular:  Negative for chest pain, leg swelling and palpitations.  Gastrointestinal:  Negative for abdominal pain, constipation, diarrhea, nausea and vomiting.  Genitourinary:  Negative for bladder incontinence, difficulty urinating, dysuria, frequency, hematuria and nocturia.   Musculoskeletal:  Negative for arthralgias, back pain, flank pain, myalgias and neck pain.  Skin:  Negative for itching and rash.  Neurological:  Negative for dizziness, headaches and numbness.  Hematological:  Does not bruise/bleed easily.  Psychiatric/Behavioral:  Negative for depression, sleep disturbance and suicidal ideas. The patient is not nervous/anxious.   All other systems reviewed and are negative.    VITALS:   Blood pressure (!) 144/72, pulse 65, temperature 97.7 F (36.5 C), temperature source Oral, resp. rate 16, weight 239 lb 8 oz (108.6 kg), SpO2 100%.  Wt Readings from Last 3 Encounters:  06/15/23 239 lb 8 oz (108.6 kg)  03/23/23 241 lb 12.8 oz (109.7 kg)  03/02/23 240 lb 9.6 oz (109.1 kg)    Body mass index is 37.51 kg/m.  Performance status (ECOG): 1 - Symptomatic but completely ambulatory  PHYSICAL EXAM:   Physical Exam Vitals and nursing note reviewed. Exam conducted with a chaperone present.  Constitutional:      Appearance: Normal appearance.  Cardiovascular:     Rate and Rhythm: Normal rate and regular rhythm.     Pulses: Normal pulses.     Heart sounds: Normal heart sounds.  Pulmonary:     Effort: Pulmonary effort is normal.     Breath sounds: Normal breath sounds.  Abdominal:     Palpations: Abdomen is soft. There is no hepatomegaly, splenomegaly or mass.     Tenderness: There is no abdominal tenderness.  Musculoskeletal:     Right lower leg: No edema.     Left lower leg: No edema.  Lymphadenopathy:     Cervical: No cervical adenopathy.     Right cervical: No superficial, deep or posterior  cervical adenopathy.    Left cervical: No superficial,  deep or posterior cervical adenopathy.     Upper Body:     Right upper body: No supraclavicular or axillary adenopathy.     Left upper body: No supraclavicular or axillary adenopathy.  Neurological:     General: No focal deficit present.     Mental Status: He is alert and oriented to person, place, and time.  Psychiatric:        Mood and Affect: Mood normal.        Behavior: Behavior normal.     LABS:      Latest Ref Rng & Units 06/15/2023   10:04 AM 05/25/2023   10:07 AM 05/04/2023   10:09 AM  CBC  WBC 4.0 - 10.5 K/uL 8.5  6.2  5.8   Hemoglobin 13.0 - 17.0 g/dL 9.8  16.1  09.6   Hematocrit 39.0 - 52.0 % 29.4  35.6  33.0   Platelets 150 - 400 K/uL 521  498  425       Latest Ref Rng & Units 06/15/2023   11:16 AM 10/06/2022   10:35 AM 09/09/2022   11:50 AM  CMP  Glucose 70 - 99 mg/dL  045  89   BUN 8 - 23 mg/dL  15  17   Creatinine 4.09 - 1.24 mg/dL  8.11  9.14   Sodium 782 - 145 mmol/L  132  137   Potassium 3.5 - 5.1 mmol/L  3.8  3.6   Chloride 98 - 111 mmol/L  101  103   CO2 22 - 32 mmol/L  26  26   Calcium 8.9 - 10.3 mg/dL  8.7  9.0   Total Protein 6.5 - 8.1 g/dL 8.1  7.8  7.9   Total Bilirubin <1.2 mg/dL 0.8  1.3  0.9   Alkaline Phos 38 - 126 U/L 86  87  81   AST 15 - 41 U/L 51  34  25   ALT 0 - 44 U/L 88  43  30      No results found for: "CEA1", "CEA" / No results found for: "CEA1", "CEA" No results found for: "PSA1" No results found for: "CAN199" No results found for: "CAN125"  Lab Results  Component Value Date   TOTALPROTELP 8.2 09/11/2021   ALBUMINELP 4.6 (H) 09/11/2021   A1GS 0.2 09/11/2021   A2GS 0.5 09/11/2021   BETS 1.0 09/11/2021   GAMS 1.9 (H) 09/11/2021   MSPIKE Not Observed 09/11/2021   SPEI Comment 09/11/2021   Lab Results  Component Value Date   TIBC 200 (L) 06/15/2023   TIBC 222 (L) 05/25/2023   TIBC 196 (L) 03/23/2023   FERRITIN 2,198 (H) 06/15/2023   FERRITIN 1,749 (H) 05/25/2023    FERRITIN 1,486 (H) 03/23/2023   IRONPCTSAT 77 (H) 06/15/2023   IRONPCTSAT 83 (H) 05/25/2023   IRONPCTSAT 85 (H) 03/23/2023   Lab Results  Component Value Date   LDH 142 06/08/2022   LDH 174 04/05/2022   LDH 160 09/11/2021     STUDIES:   No results found.

## 2023-06-15 NOTE — Progress Notes (Signed)
Patient presents today for Reblozyl injection. Patient is in satisfactory condition with no new complaints voiced.  Vital signs are stable.  Labs reviewed by Dr. Ellin Saba during the office visit.  We will proceed with treatment per MD orders.   Patient tolerated injection in left arm with no complaints voiced.  Site clean and dry with no bruising or swelling noted.  No complaints of pain.  Discharged with vital signs stable and no signs or symptoms of distress noted.

## 2023-06-15 NOTE — Patient Instructions (Signed)
[facility] - A DEPT OF [facility]  Discharge Instructions: Thank you for choosing [facility] to provide your oncology and hematology care.  If you have a lab appointment with the Cancer Center - please note that after April 8th, 2024, all labs will be drawn in the cancer center.  You do not have to check in or register with the main entrance as you have in the past but will complete your check-in in the cancer center.  Wear comfortable clothing and clothing appropriate for easy access to any Portacath or PICC line.   We strive to give you quality time with your provider. You may need to reschedule your appointment if you arrive late (15 or more minutes).  Arriving late affects you and other patients whose appointments are after yours.  Also, if you miss three or more appointments without notifying the office, you may be dismissed from the clinic at the provider's discretion.      For prescription refill requests, have your pharmacy contact our office and allow 72 hours for refills to be completed.    Today you received the following chemotherapy and/or immunotherapy agents Reblozyl.  Luspatercept Injection What is this medication? LUSPATERCEPT (lus PAT er sept) treats low levels of red blood cells (anemia) in the body in people with beta thalassemia or myelodysplastic syndromes. It works by helping the body make more red blood cells. This medicine may be used for other purposes; ask your health care provider or pharmacist if you have questions. COMMON BRAND NAME(S): REBLOZYL What should I tell my care team before I take this medication? They need to know if you have any of these conditions: Have had your spleen removed High blood pressure History of blood clots Tobacco use An unusual or allergic reaction to luspatercept, other medications, foods, dyes, or preservatives Pregnant or trying to get pregnant Breastfeeding How should I use this  medication? This medication is injected under the skin. It is given by your care team in a hospital or clinic setting. Talk to your care team about the use of the medication in children. It is not approved for use in children. Overdosage: If you think you have taken too much of this medicine contact a poison control center or emergency room at once. NOTE: This medicine is only for you. Do not share this medicine with others. What if I miss a dose? Keep appointments for follow-up doses. It is important not to miss your dose. Call your care team if you are unable to keep an appointment. What may interact with this medication? Interactions are not expected. This list may not describe all possible interactions. Give your health care provider a list of all the medicines, herbs, non-prescription drugs, or dietary supplements you use. Also tell them if you smoke, drink alcohol, or use illegal drugs. Some items may interact with your medicine. What should I watch for while using this medication? Your condition will be monitored carefully while you are receiving this medication. You may need blood work done while you are taking this medication. Talk to your care team if you may be pregnant. Serious birth defects can occur if you take this medication during pregnancy. Contraception is recommended while taking this medication. Your care team can help you find the option that works for you. Talk to your care team before breastfeeding. Changes to your treatment plan may be needed. What side effects may I notice from receiving this medication? Side effects  that you should report to your care team as soon as possible: Allergic reactions--skin rash, itching, hives, swelling of the face, lips, tongue, or throat Blood clot--pain, swelling, or warmth in the leg, shortness of breath, chest pain Increase in blood pressure Severe back pain, numbness or weakness of the hands, arms, legs, or feet, loss of coordination,  loss of bowel or bladder control Side effects that usually do not require medical attention (report these to your care team if they continue or are bothersome): Bone pain Dizziness Fatigue Headache Joint pain Muscle pain Stomach pain This list may not describe all possible side effects. Call your doctor for medical advice about side effects. You may report side effects to FDA at 1-800-FDA-1088. Where should I keep my medication? This medication is given in a hospital or clinic. It will not be stored at home. NOTE: This sheet is a summary. It may not cover all possible information. If you have questions about this medicine, talk to your doctor, pharmacist, or health care provider.  2024 Elsevier/Gold Standard (2022-12-24 00:00:00)       To help prevent nausea and vomiting after your treatment, we encourage you to take your nausea medication as directed.  BELOW ARE SYMPTOMS THAT SHOULD BE REPORTED IMMEDIATELY: *FEVER GREATER THAN 100.4 F (38 C) OR HIGHER *CHILLS OR SWEATING *NAUSEA AND VOMITING THAT IS NOT CONTROLLED WITH YOUR NAUSEA MEDICATION *UNUSUAL SHORTNESS OF BREATH *UNUSUAL BRUISING OR BLEEDING *URINARY PROBLEMS (pain or burning when urinating, or frequent urination) *BOWEL PROBLEMS (unusual diarrhea, constipation, pain near the anus) TENDERNESS IN MOUTH AND THROAT WITH OR WITHOUT PRESENCE OF ULCERS (sore throat, sores in mouth, or a toothache) UNUSUAL RASH, SWELLING OR PAIN  UNUSUAL VAGINAL DISCHARGE OR ITCHING   Items with * indicate a potential emergency and should be followed up as soon as possible or go to the Emergency Department if any problems should occur.  Please show the CHEMOTHERAPY ALERT CARD or IMMUNOTHERAPY ALERT CARD at check-in to the Emergency Department and triage nurse.  Should you have questions after your visit or need to cancel or reschedule your appointment, please contact [facility] - A DEPT OF [facility]  [phone]  and follow the prompts.  Office hours are 8:00 a.m. to 4:30 p.m. Monday - Friday. Please note that voicemails left after 4:00 p.m. may not be returned until the following business day.  We are closed weekends and major holidays. You have access to a nurse at all times for urgent questions. Please call the main number to the clinic [phone] and follow the prompts.  For any non-urgent questions, you may also contact your provider using MyChart. We now offer e-Visits for anyone 64 and older to request care online for non-urgent symptoms. For details visit [email or web address].   Also download the MyChart app! Go to the app store, search "MyChart", open the app, select [place], and log in with your MyChart username and password.

## 2023-06-15 NOTE — Patient Instructions (Addendum)
[facility] at [facility] Discharge Instructions   You were seen and examined today by Dr. Ellin Saba.  He reviewed the results of your lab work which are normal/stable.   We will proceed with your Reblozyl injection today.   We will do an MRI of your liver prior to next visit.   We will see you back in 3 months. We will repeat lab work prior to this visit.   Return as scheduled.    Thank you for choosing [facility] at [facility] to provide your oncology and hematology care.  To afford each patient quality time with our provider, please arrive at least 15 minutes before your scheduled appointment time.   If you have a lab appointment with the Cancer Center please come in thru the Main Entrance and check in at the main information desk.  You need to re-schedule your appointment should you arrive 10 or more minutes late.  We strive to give you quality time with our providers, and arriving late affects you and other patients whose appointments are after yours.  Also, if you no show three or more times for appointments you may be dismissed from the clinic at the providers discretion.     Again, thank you for choosing [facility].  Our hope is that these requests will decrease the amount of time that you wait before being seen by our physicians.       _____________________________________________________________  Should you have questions after your visit to [facility], please contact our office at [phone] and follow the prompts.  Our office hours are 8:00 a.m. and 4:30 p.m. Monday - Friday.  Please note that voicemails left after 4:00 p.m. may not be returned until the following business day.  We are closed weekends and major holidays.  You do have access to a nurse 24-7, just call the main number to the clinic [phone] and do not press any options, hold on the line and a nurse will answer the phone.    For  prescription refill requests, have your pharmacy contact our office and allow 72 hours.    Due to Covid, you will need to wear a mask upon entering the hospital. If you do not have a mask, a mask will be given to you at the Main Entrance upon arrival. For doctor visits, patients may have 1 support person age 39 or older with them. For treatment visits, patients can not have anyone with them due to social distancing guidelines and our immunocompromised population.

## 2023-06-16 ENCOUNTER — Other Ambulatory Visit: Payer: Self-pay

## 2023-07-06 ENCOUNTER — Inpatient Hospital Stay: Payer: 59

## 2023-07-06 ENCOUNTER — Other Ambulatory Visit: Payer: Self-pay

## 2023-07-06 VITALS — BP 142/75 | HR 70 | Temp 96.8°F | Resp 20

## 2023-07-06 DIAGNOSIS — Z79899 Other long term (current) drug therapy: Secondary | ICD-10-CM | POA: Diagnosis not present

## 2023-07-06 DIAGNOSIS — D649 Anemia, unspecified: Secondary | ICD-10-CM

## 2023-07-06 DIAGNOSIS — D469 Myelodysplastic syndrome, unspecified: Secondary | ICD-10-CM

## 2023-07-06 DIAGNOSIS — Z833 Family history of diabetes mellitus: Secondary | ICD-10-CM | POA: Diagnosis not present

## 2023-07-06 DIAGNOSIS — Z9049 Acquired absence of other specified parts of digestive tract: Secondary | ICD-10-CM | POA: Diagnosis not present

## 2023-07-06 DIAGNOSIS — I10 Essential (primary) hypertension: Secondary | ICD-10-CM | POA: Diagnosis not present

## 2023-07-06 DIAGNOSIS — D461 Refractory anemia with ring sideroblasts: Secondary | ICD-10-CM | POA: Diagnosis not present

## 2023-07-06 DIAGNOSIS — Z803 Family history of malignant neoplasm of breast: Secondary | ICD-10-CM | POA: Diagnosis not present

## 2023-07-06 DIAGNOSIS — D75839 Thrombocytosis, unspecified: Secondary | ICD-10-CM | POA: Diagnosis not present

## 2023-07-06 DIAGNOSIS — Z886 Allergy status to analgesic agent status: Secondary | ICD-10-CM | POA: Diagnosis not present

## 2023-07-06 DIAGNOSIS — Z882 Allergy status to sulfonamides status: Secondary | ICD-10-CM | POA: Diagnosis not present

## 2023-07-06 DIAGNOSIS — Z8249 Family history of ischemic heart disease and other diseases of the circulatory system: Secondary | ICD-10-CM | POA: Diagnosis not present

## 2023-07-06 DIAGNOSIS — Z800 Family history of malignant neoplasm of digestive organs: Secondary | ICD-10-CM | POA: Diagnosis not present

## 2023-07-06 LAB — CBC
HCT: 31.4 % — ABNORMAL LOW (ref 39.0–52.0)
Hemoglobin: 10.4 g/dL — ABNORMAL LOW (ref 13.0–17.0)
MCH: 32.1 pg (ref 26.0–34.0)
MCHC: 33.1 g/dL (ref 30.0–36.0)
MCV: 96.9 fL (ref 80.0–100.0)
Platelets: 399 10*3/uL (ref 150–400)
RBC: 3.24 MIL/uL — ABNORMAL LOW (ref 4.22–5.81)
RDW: 25.2 % — ABNORMAL HIGH (ref 11.5–15.5)
WBC: 6.3 10*3/uL (ref 4.0–10.5)
nRBC: 0 % (ref 0.0–0.2)

## 2023-07-06 MED ORDER — LUSPATERCEPT-AAMT 75 MG ~~LOC~~ SOLR
0.9500 mg/kg | Freq: Once | SUBCUTANEOUS | Status: AC
  Administered 2023-07-06: 100 mg via SUBCUTANEOUS
  Filled 2023-07-06: qty 2
  Filled 2023-07-06: qty 1.5

## 2023-07-06 NOTE — Progress Notes (Signed)
Stephen Watts presents today for injection per the provider's orders.  Reblozyl administration without incident; injection site WNL; see MAR for injection details.  Patient tolerated procedure well and without incident.  No questions or complaints noted at this time. Patient's hemoglobin noted to be 10.4 today.  Discharged from clinic ambulatory in stable condition. Alert and oriented x 3. F/U with [facility] as scheduled.

## 2023-07-06 NOTE — Patient Instructions (Signed)
[facility] - A DEPT OF [facility]  Discharge Instructions: Thank you for choosing [facility] to provide your oncology and hematology care.  If you have a lab appointment with the Cancer Center - please note that after April 8th, 2024, all labs will be drawn in the cancer center.  You do not have to check in or register with the main entrance as you have in the past but will complete your check-in in the cancer center.  Wear comfortable clothing and clothing appropriate for easy access to any Portacath or PICC line.   We strive to give you quality time with your provider. You may need to reschedule your appointment if you arrive late (15 or more minutes).  Arriving late affects you and other patients whose appointments are after yours.  Also, if you miss three or more appointments without notifying the office, you may be dismissed from the clinic at the provider's discretion.      For prescription refill requests, have your pharmacy contact our office and allow 72 hours for refills to be completed.    Today you received Rebloyzl injection.     BELOW ARE SYMPTOMS THAT SHOULD BE REPORTED IMMEDIATELY: *FEVER GREATER THAN 100.4 F (38 C) OR HIGHER *CHILLS OR SWEATING *NAUSEA AND VOMITING THAT IS NOT CONTROLLED WITH YOUR NAUSEA MEDICATION *UNUSUAL SHORTNESS OF BREATH *UNUSUAL BRUISING OR BLEEDING *URINARY PROBLEMS (pain or burning when urinating, or frequent urination) *BOWEL PROBLEMS (unusual diarrhea, constipation, pain near the anus) TENDERNESS IN MOUTH AND THROAT WITH OR WITHOUT PRESENCE OF ULCERS (sore throat, sores in mouth, or a toothache) UNUSUAL RASH, SWELLING OR PAIN  UNUSUAL VAGINAL DISCHARGE OR ITCHING   Items with * indicate a potential emergency and should be followed up as soon as possible or go to the Emergency Department if any problems should occur.  Please show the CHEMOTHERAPY ALERT CARD or IMMUNOTHERAPY ALERT CARD at  check-in to the Emergency Department and triage nurse.  Should you have questions after your visit or need to cancel or reschedule your appointment, please contact [facility] - A DEPT OF [facility] [phone]  and follow the prompts.  Office hours are 8:00 a.m. to 4:30 p.m. Monday - Friday. Please note that voicemails left after 4:00 p.m. may not be returned until the following business day.  We are closed weekends and major holidays. You have access to a nurse at all times for urgent questions. Please call the main number to the clinic [phone] and follow the prompts.  For any non-urgent questions, you may also contact your provider using MyChart. We now offer e-Visits for anyone 64 and older to request care online for non-urgent symptoms. For details visit [email or web address].   Also download the MyChart app! Go to the app store, search "MyChart", open the app, select [place], and log in with your MyChart username and password.

## 2023-07-27 ENCOUNTER — Inpatient Hospital Stay: Payer: 59 | Attending: Hematology

## 2023-07-27 ENCOUNTER — Inpatient Hospital Stay: Payer: 59

## 2023-07-27 DIAGNOSIS — D75839 Thrombocytosis, unspecified: Secondary | ICD-10-CM | POA: Insufficient documentation

## 2023-07-27 DIAGNOSIS — D649 Anemia, unspecified: Secondary | ICD-10-CM

## 2023-07-27 DIAGNOSIS — D461 Refractory anemia with ring sideroblasts: Secondary | ICD-10-CM | POA: Insufficient documentation

## 2023-07-27 DIAGNOSIS — Z79899 Other long term (current) drug therapy: Secondary | ICD-10-CM | POA: Diagnosis not present

## 2023-07-27 DIAGNOSIS — D469 Myelodysplastic syndrome, unspecified: Secondary | ICD-10-CM

## 2023-07-27 LAB — CBC
HCT: 35.7 % — ABNORMAL LOW (ref 39.0–52.0)
Hemoglobin: 11.8 g/dL — ABNORMAL LOW (ref 13.0–17.0)
MCH: 32 pg (ref 26.0–34.0)
MCHC: 33.1 g/dL (ref 30.0–36.0)
MCV: 96.7 fL (ref 80.0–100.0)
Platelets: 345 10*3/uL (ref 150–400)
RBC: 3.69 MIL/uL — ABNORMAL LOW (ref 4.22–5.81)
RDW: 24.5 % — ABNORMAL HIGH (ref 11.5–15.5)
WBC: 5.5 10*3/uL (ref 4.0–10.5)
nRBC: 0 % (ref 0.0–0.2)

## 2023-07-27 NOTE — Progress Notes (Signed)
No injection needed today due to hemoglobin 11.8

## 2023-08-09 DIAGNOSIS — Z6837 Body mass index (BMI) 37.0-37.9, adult: Secondary | ICD-10-CM | POA: Diagnosis not present

## 2023-08-09 DIAGNOSIS — Z833 Family history of diabetes mellitus: Secondary | ICD-10-CM | POA: Diagnosis not present

## 2023-08-09 DIAGNOSIS — D649 Anemia, unspecified: Secondary | ICD-10-CM | POA: Diagnosis not present

## 2023-08-09 DIAGNOSIS — Z8249 Family history of ischemic heart disease and other diseases of the circulatory system: Secondary | ICD-10-CM | POA: Diagnosis not present

## 2023-08-09 DIAGNOSIS — Z886 Allergy status to analgesic agent status: Secondary | ICD-10-CM | POA: Diagnosis not present

## 2023-08-09 DIAGNOSIS — Z7984 Long term (current) use of oral hypoglycemic drugs: Secondary | ICD-10-CM | POA: Diagnosis not present

## 2023-08-09 DIAGNOSIS — E119 Type 2 diabetes mellitus without complications: Secondary | ICD-10-CM | POA: Diagnosis not present

## 2023-08-09 DIAGNOSIS — Z91018 Allergy to other foods: Secondary | ICD-10-CM | POA: Diagnosis not present

## 2023-08-09 DIAGNOSIS — I10 Essential (primary) hypertension: Secondary | ICD-10-CM | POA: Diagnosis not present

## 2023-08-16 ENCOUNTER — Other Ambulatory Visit: Payer: Self-pay

## 2023-08-16 DIAGNOSIS — D469 Myelodysplastic syndrome, unspecified: Secondary | ICD-10-CM

## 2023-08-17 ENCOUNTER — Inpatient Hospital Stay: Payer: 59

## 2023-08-17 ENCOUNTER — Inpatient Hospital Stay: Payer: 59 | Attending: Hematology

## 2023-08-17 VITALS — BP 148/75 | HR 74 | Temp 96.5°F | Resp 18

## 2023-08-17 DIAGNOSIS — K760 Fatty (change of) liver, not elsewhere classified: Secondary | ICD-10-CM | POA: Insufficient documentation

## 2023-08-17 DIAGNOSIS — Z882 Allergy status to sulfonamides status: Secondary | ICD-10-CM | POA: Insufficient documentation

## 2023-08-17 DIAGNOSIS — D461 Refractory anemia with ring sideroblasts: Secondary | ICD-10-CM | POA: Insufficient documentation

## 2023-08-17 DIAGNOSIS — I10 Essential (primary) hypertension: Secondary | ICD-10-CM | POA: Diagnosis not present

## 2023-08-17 DIAGNOSIS — D469 Myelodysplastic syndrome, unspecified: Secondary | ICD-10-CM

## 2023-08-17 DIAGNOSIS — D75839 Thrombocytosis, unspecified: Secondary | ICD-10-CM | POA: Diagnosis not present

## 2023-08-17 DIAGNOSIS — Z9049 Acquired absence of other specified parts of digestive tract: Secondary | ICD-10-CM | POA: Diagnosis not present

## 2023-08-17 DIAGNOSIS — R7989 Other specified abnormal findings of blood chemistry: Secondary | ICD-10-CM | POA: Insufficient documentation

## 2023-08-17 DIAGNOSIS — Z800 Family history of malignant neoplasm of digestive organs: Secondary | ICD-10-CM | POA: Insufficient documentation

## 2023-08-17 DIAGNOSIS — Z833 Family history of diabetes mellitus: Secondary | ICD-10-CM | POA: Insufficient documentation

## 2023-08-17 DIAGNOSIS — Z79899 Other long term (current) drug therapy: Secondary | ICD-10-CM | POA: Diagnosis not present

## 2023-08-17 DIAGNOSIS — Z8249 Family history of ischemic heart disease and other diseases of the circulatory system: Secondary | ICD-10-CM | POA: Insufficient documentation

## 2023-08-17 DIAGNOSIS — Z803 Family history of malignant neoplasm of breast: Secondary | ICD-10-CM | POA: Insufficient documentation

## 2023-08-17 DIAGNOSIS — Z886 Allergy status to analgesic agent status: Secondary | ICD-10-CM | POA: Insufficient documentation

## 2023-08-17 LAB — CBC WITH DIFFERENTIAL/PLATELET
Abs Immature Granulocytes: 0.02 K/uL (ref 0.00–0.07)
Basophils Absolute: 0.1 K/uL (ref 0.0–0.1)
Basophils Relative: 1 %
Eosinophils Absolute: 0.1 K/uL (ref 0.0–0.5)
Eosinophils Relative: 2 %
HCT: 32.3 % — ABNORMAL LOW (ref 39.0–52.0)
Hemoglobin: 10.7 g/dL — ABNORMAL LOW (ref 13.0–17.0)
Immature Granulocytes: 0 %
Lymphocytes Relative: 41 %
Lymphs Abs: 2.4 K/uL (ref 0.7–4.0)
MCH: 31.8 pg (ref 26.0–34.0)
MCHC: 33.1 g/dL (ref 30.0–36.0)
MCV: 96.1 fL (ref 80.0–100.0)
Monocytes Absolute: 0.8 K/uL (ref 0.1–1.0)
Monocytes Relative: 13 %
Neutro Abs: 2.4 K/uL (ref 1.7–7.7)
Neutrophils Relative %: 43 %
Platelets: 482 K/uL — ABNORMAL HIGH (ref 150–400)
RBC: 3.36 MIL/uL — ABNORMAL LOW (ref 4.22–5.81)
RDW: 25 % — ABNORMAL HIGH (ref 11.5–15.5)
Smear Review: INCREASED
WBC: 5.8 K/uL (ref 4.0–10.5)
nRBC: 0.5 % — ABNORMAL HIGH (ref 0.0–0.2)

## 2023-08-17 MED ORDER — LUSPATERCEPT-AAMT 75 MG ~~LOC~~ SOLR
0.9500 mg/kg | Freq: Once | SUBCUTANEOUS | Status: AC
  Administered 2023-08-17: 100 mg via SUBCUTANEOUS
  Filled 2023-08-17: qty 1.5
  Filled 2023-08-17: qty 2

## 2023-08-17 NOTE — Progress Notes (Signed)
 Stephen Watts presents today for injection per the provider's orders.  Reblozyl  administration without incident; injection site WNL; see MAR for injection details.  Patient tolerated procedure well and without incident.  No questions or complaints noted at this time. Patient's hemoglobin noted to be 10.7 today.   Discharged from clinic ambulatory in stable condition. Alert and oriented x 3. F/U with [facility] as scheduled.

## 2023-08-17 NOTE — Patient Instructions (Signed)
 [facility] [place] - A DEPT OF [facility]  Discharge Instructions: Thank you for choosing [facility] to provide your oncology and hematology care.  If you have a lab appointment with the Cancer Center - please note that after April 8th, 2024, all labs will be drawn in the cancer center.  You do not have to check in or register with the main entrance as you have in the past but will complete your check-in in the cancer center.  Wear comfortable clothing and clothing appropriate for easy access to any Portacath or PICC line.   We strive to give you quality time with your provider. You may need to reschedule your appointment if you arrive late (15 or more minutes).  Arriving late affects you and other patients whose appointments are after yours.  Also, if you miss three or more appointments without notifying the office, you may be dismissed from the clinic at the provider's discretion.      For prescription refill requests, have your pharmacy contact our office and allow 72 hours for refills to be completed.    Today you received Reblozyl  injection.     BELOW ARE SYMPTOMS THAT SHOULD BE REPORTED IMMEDIATELY: *FEVER GREATER THAN 100.4 F (38 C) OR HIGHER *CHILLS OR SWEATING *NAUSEA AND VOMITING THAT IS NOT CONTROLLED WITH YOUR NAUSEA MEDICATION *UNUSUAL SHORTNESS OF BREATH *UNUSUAL BRUISING OR BLEEDING *URINARY PROBLEMS (pain or burning when urinating, or frequent urination) *BOWEL PROBLEMS (unusual diarrhea, constipation, pain near the anus) TENDERNESS IN MOUTH AND THROAT WITH OR WITHOUT PRESENCE OF ULCERS (sore throat, sores in mouth, or a toothache) UNUSUAL RASH, SWELLING OR PAIN  UNUSUAL VAGINAL DISCHARGE OR ITCHING   Items with * indicate a potential emergency and should be followed up as soon as possible or go to the Emergency Department if any problems should occur.  Please show the CHEMOTHERAPY ALERT CARD or IMMUNOTHERAPY ALERT CARD at  check-in to the Emergency Department and triage nurse.  Should you have questions after your visit or need to cancel or reschedule your appointment, please contact [facility] [place] - A DEPT OF [facility] [phone]  and follow the prompts.  Office hours are 8:00 a.m. to 4:30 p.m. Monday - Friday. Please note that voicemails left after 4:00 p.m. may not be returned until the following business day.  We are closed weekends and major holidays. You have access to a nurse at all times for urgent questions. Please call the main number to the clinic [phone] and follow the prompts.  For any non-urgent questions, you may also contact your provider using MyChart. We now offer e-Visits for anyone 38 and older to request care online for non-urgent symptoms. For details visit [email or web address].   Also download the MyChart app! Go to the app store, search "MyChart", open the app, select [place], and log in with your MyChart username and password.

## 2023-08-24 ENCOUNTER — Encounter: Payer: Self-pay | Admitting: Hematology

## 2023-08-30 DIAGNOSIS — D649 Anemia, unspecified: Secondary | ICD-10-CM | POA: Diagnosis not present

## 2023-08-30 DIAGNOSIS — I10 Essential (primary) hypertension: Secondary | ICD-10-CM | POA: Diagnosis not present

## 2023-08-30 DIAGNOSIS — F5221 Male erectile disorder: Secondary | ICD-10-CM | POA: Diagnosis not present

## 2023-08-30 DIAGNOSIS — E119 Type 2 diabetes mellitus without complications: Secondary | ICD-10-CM | POA: Diagnosis not present

## 2023-08-31 ENCOUNTER — Ambulatory Visit (HOSPITAL_COMMUNITY)
Admission: RE | Admit: 2023-08-31 | Discharge: 2023-08-31 | Disposition: A | Discharge status: Home / Self Care | Payer: 59 | Source: Ambulatory Visit | Attending: Hematology | Admitting: Hematology

## 2023-08-31 DIAGNOSIS — K760 Fatty (change of) liver, not elsewhere classified: Secondary | ICD-10-CM | POA: Diagnosis not present

## 2023-08-31 DIAGNOSIS — R7989 Other specified abnormal findings of blood chemistry: Secondary | ICD-10-CM | POA: Diagnosis not present

## 2023-08-31 MED ORDER — GADOBUTROL 1 MMOL/ML IV SOLN
10.0000 mL | Freq: Once | INTRAVENOUS | Status: AC | PRN
  Administered 2023-08-31: 10 mL via INTRAVENOUS

## 2023-09-06 NOTE — Progress Notes (Signed)
[facility] [address], [place] [address]    Clinic Day:  09/07/2023  Referring physician: Elisabeth Cara, PA  Patient Care Team: Elisabeth Cara, [place] as PCP - General (Family Medicine) Doreatha Massed, MD as Medical Oncologist (Hematology)   ASSESSMENT & PLAN:   Assessment: 1. MDS with ringed sideroblasts and thrombocytosis: - Patient seen for normocytic anemia with CBC on 05/08/2021 showing hemoglobin 8.4, MCV 97 and platelet count 631. - Ferritin was 1454, percent saturation was 85.  Hemochromatosis was negative. - He is taking iron tablet 2 times daily for the last 1 year. - Colonoscopy was reportedly done in [place] 3 years ago, within normal limits. - BMBX (10/08/2021): Hypercellular marrow with erythroid hyperplasia, erythroid dysplasia and ring sideroblasts.  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 - IPSS M: Score is -0.73 (low risk).  IPSS-R score 2.5.  Median leukemia free survival was 5.9%.  Median overall survival was 6 years.  AML transformation rate was 1.7 %/year. - Ring sideroblasts were more than 15%.  JAK2 V617F and reflex testing and BCR/ABL by FISH were negative for thrombocytosis. - Serum EPO was 107. - Retacrit 30,000 units weekly started on 11/26/2021, dose changed to 40,000 units every 2 weeks on 12/31/2021, dose increased to 60,000 units weekly on 03/01/2022.  Last dose of Retacrit on 06/22/2022.  Luspatercept 1 mg/kg started on 06/15/2022.   2. 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 - Denies any smoking history. - Paternal aunt had pancreatic cancer.  Sister had breast cancer.    Plan: 1. MDS with ring sideroblasts and thrombocytosis: - Denies any bleeding per rectum or melena. - She is tolerating Luspatercept reasonably well. - Hemoglobin  today is 12.7.  He will hold Luspatercept today.  Recheck in 3 weeks.  2.  Elevated ferritin (hemochromatosis negative): - Previous hemochromatosis testing was negative for C282Y and H63D. - Ferritin level today is 2113.  Percent saturation is 83. - Reviewed MRI liver (08/30/2022): Severe hepatic steatosis with marked hepatic iron deposition.  Calculated hepatic iron content is 17.1 mg/g. - I have talked to him about testing for less common forms of hemochromatosis including HJV (hemojuvelin), HAMP (hepcidin), TRF2 (transferrin receptor 2) and ferroportin by doing [facility] testing. - We  have talked about starting on iron chelating agents, Jadenu at 14 mg/kg daily on an empty stomach or light low-fat meal same time every day.  Will prescribe it at next visit in 3 weeks.  Will obtain baseline ferritin and LFTs today. - He is taking iron tablet every other day for the last 1 year.  Recommend stopping iron tablet.  Counseled him to not to cocaine Caston pots and pans.  Counseled him to stay away from alcoholic beverages.  Do not eat raw seafood. - Elevated ferritin is partly from steatosis.  He does not have any symptoms at this time.  Will also consider cardiac MRI if he has any symptoms.  3.  Hypertension: - Continue lisinopril, HCTZ and Norvasc.  Blood pressure is 160/80.  He reports that blood pressure is much better at home.    Orders Placed This Encounter  Procedures   CBC with Differential    Standing Status:   Future    Expected Date:   09/07/2023    Expiration Date:   09/06/2024  Iron and TIBC ([facility] ONLY)    Standing Status:   Future    Number of Occurrences:   1    Expected Date:   09/07/2023    Expiration Date:   09/06/2024   Ferritin    Standing Status:   Future    Number of Occurrences:   1    Expected Date:   09/07/2023    Expiration Date:   09/06/2024   Hepatic function panel    Standing Status:   Future    Number of Occurrences:   1    Expected Date:    09/07/2023    Expiration Date:   09/06/2024      I,Katie Daubenspeck,acting as a scribe for Doreatha Massed, MD.,have documented all relevant documentation on the behalf of Doreatha Massed, MD,as directed by  Doreatha Massed, MD while in the presence of Doreatha Massed, MD.   I, Doreatha Massed MD, have reviewed the above documentation for accuracy and completeness, and I agree with the above.   Doreatha Massed, MD   1/29/20254:53 PM  CHIEF COMPLAINT:   Diagnosis: normocytic anemia from MDS    Cancer Staging  No matching staging information was found for the patient.    Prior Therapy: Retacrit 60,000 units weekly   Current Therapy:  Luspatercept every 3 weeks    HISTORY OF PRESENT ILLNESS:   Oncology History  MDS (myelodysplastic syndrome) (HCC)  11/14/2021 Initial Diagnosis   MDS (myelodysplastic syndrome) (HCC)   06/15/2022 -  Chemotherapy   Patient is on Treatment Plan : MYELODYSPLASIA Luspatercept q21d        INTERVAL HISTORY:   Stephen Watts is a 63 y.o. male presenting to clinic today for follow up of normocytic anemia from MDS. He was last seen by me on 06/15/23.  Since his last visit, he underwent liver MRI on 08/31/23 showing: hepatic signal characteristics consistent with severe hepatic steatosis and marked hepatic iron deposition.  Today, he states that he is doing well overall. His appetite level is at 100%. His energy level is at 25%.  PAST MEDICAL HISTORY:   Past Medical History: Past Medical History:  Diagnosis Date   Anemia    Diabetes (HCC)    Hypertension     Surgical History: Past Surgical History:  Procedure Laterality Date   APPENDECTOMY     age 57 years old    Social History: Social History   Socioeconomic History   Marital status: Widowed    Spouse name: Not on file   Number of children: Not on file   Years of education: Not on file   Highest education level: Not on file  Occupational History   Not on file   Tobacco Use   Smoking status: Never    Passive exposure: Never   Smokeless tobacco: Never  Substance and Sexual Activity   Alcohol use: Never   Drug use: Never   Sexual activity: Not on file  Other Topics Concern   Not on file  Social History Narrative   Not on file   Social Drivers of Health   Financial Resource Strain: Not on file  Food Insecurity: Not on file  Transportation Needs: Not on file  Physical Activity: Not on file  Stress: Not on file  Social Connections: Not on file  Intimate Partner Violence: Not on file    Family History: Family History  Problem Relation Age of Onset   Heart disease Mother    Diabetes Sister     Current Medications:  Current Outpatient Medications:    amLODipine (NORVASC) 10 MG tablet, Take 10 mg by mouth daily., Disp: , Rfl:    ferrous sulfate 325 (65 FE) MG tablet, Take 325 mg by mouth 2 (two) times a week., Disp: , Rfl:    glimepiride (AMARYL) 2 MG tablet, Take 2 mg by mouth daily with breakfast., Disp: , Rfl:    hydrochlorothiazide (HYDRODIURIL) 25 MG tablet, Take 25 mg by mouth daily., Disp: , Rfl:    levothyroxine (SYNTHROID) 125 MCG tablet, TAKE 1 TABLET BY MOUTH MONDAY THRU SATURDAY, Disp: , Rfl:    lisinopril (ZESTRIL) 40 MG tablet, Take 40 mg by mouth daily., Disp: , Rfl:    metFORMIN (GLUCOPHAGE) 1000 MG tablet, Take 1,000 mg by mouth 2 (two) times daily., Disp: , Rfl:    metoprolol succinate (TOPROL-XL) 50 MG 24 hr tablet, Take 50 mg by mouth daily., Disp: , Rfl:    polyethylene glycol-electrolytes (NULYTELY) 420 g solution, Take 4,000 mLs by mouth once., Disp: , Rfl:    sildenafil (VIAGRA) 50 MG tablet, Take 50 mg by mouth daily as needed., Disp: , Rfl:    sitaGLIPtin (JANUVIA) 100 MG tablet, Take 100 mg by mouth daily., Disp: , Rfl:  No current facility-administered medications for this visit.  Facility-Administered Medications Ordered in Other Visits:    acetaminophen (TYLENOL) 325 MG tablet, , , ,    loratadine  (CLARITIN) 10 MG tablet, , , ,    Allergies: Allergies  Allergen Reactions   Ibuprofen Hives   Sulfa Antibiotics Rash    REVIEW OF SYSTEMS:   Review of Systems  Constitutional:  Negative for chills, fatigue and fever.  HENT:   Negative for lump/mass, mouth sores, nosebleeds, sore throat and trouble swallowing.   Eyes:  Negative for eye problems.  Respiratory:  Negative for cough and shortness of breath.   Cardiovascular:  Negative for chest pain, leg swelling and palpitations.  Gastrointestinal:  Negative for abdominal pain, constipation, diarrhea, nausea and vomiting.  Genitourinary:  Negative for bladder incontinence, difficulty urinating, dysuria, frequency, hematuria and nocturia.   Musculoskeletal:  Negative for arthralgias, back pain, flank pain, myalgias and neck pain.  Skin:  Negative for itching and rash.  Neurological:  Negative for dizziness, headaches and numbness.  Hematological:  Does not bruise/bleed easily.  Psychiatric/Behavioral:  Negative for depression, sleep disturbance and suicidal ideas. The patient is not nervous/anxious.   All other systems reviewed and are negative.    VITALS:   Blood pressure (!) 163/84, pulse 63, temperature 98.2 F (36.8 C), temperature source Oral, resp. rate 18, weight 242 lb 8.1 oz (110 kg), SpO2 100%.  Wt Readings from Last 3 Encounters:  09/07/23 242 lb 8.1 oz (110 kg)  06/15/23 239 lb 8 oz (108.6 kg)  03/23/23 241 lb 12.8 oz (109.7 kg)    Body mass index is 37.98 kg/m.  Performance status (ECOG): 1 - Symptomatic but completely ambulatory  PHYSICAL EXAM:   Physical Exam Vitals and nursing note reviewed. Exam conducted with a chaperone present.  Constitutional:      Appearance: Normal appearance.  Cardiovascular:     Rate and Rhythm: Normal rate and regular rhythm.     Pulses: Normal pulses.     Heart sounds: Normal heart sounds.  Pulmonary:     Effort: Pulmonary effort is normal.     Breath sounds: Normal breath  sounds.  Abdominal:     Palpations: Abdomen is soft. There is no hepatomegaly, splenomegaly or mass.  Tenderness: There is no abdominal tenderness.  Musculoskeletal:     Right lower leg: No edema.     Left lower leg: No edema.  Lymphadenopathy:     Cervical: No cervical adenopathy.     Right cervical: No superficial, deep or posterior cervical adenopathy.    Left cervical: No superficial, deep or posterior cervical adenopathy.     Upper Body:     Right upper body: No supraclavicular or axillary adenopathy.     Left upper body: No supraclavicular or axillary adenopathy.  Neurological:     General: No focal deficit present.     Mental Status: He is alert and oriented to person, place, and time.  Psychiatric:        Mood and Affect: Mood normal.        Behavior: Behavior normal.     LABS:   CBC     Component Value Date/Time   WBC 5.5 09/07/2023 0932   RBC 3.89 (L) 09/07/2023 0932   HGB 12.7 (L) 09/07/2023 0932   HCT 38.4 (L) 09/07/2023 0932   PLT 389 09/07/2023 0932   MCV 98.7 09/07/2023 0932   MCH 32.6 09/07/2023 0932   MCHC 33.1 09/07/2023 0932   RDW 26.7 (H) 09/07/2023 0932   LYMPHSABS 2.5 09/07/2023 0932   MONOABS 0.6 09/07/2023 0932   EOSABS 0.1 09/07/2023 0932   BASOSABS 0.1 09/07/2023 0932    CMP      Component Value Date/Time   NA 132 (L) 10/06/2022 1035   K 3.8 10/06/2022 1035   CL 101 10/06/2022 1035   CO2 26 10/06/2022 1035   GLUCOSE 257 (H) 10/06/2022 1035   BUN 15 10/06/2022 1035   CREATININE 0.88 10/06/2022 1035   CALCIUM 8.7 (L) 10/06/2022 1035   PROT 8.0 09/07/2023 1043   ALBUMIN 4.1 09/07/2023 1043   AST 30 09/07/2023 1043   ALT 32 09/07/2023 1043   ALKPHOS 66 09/07/2023 1043   BILITOT 1.4 (H) 09/07/2023 1043   GFRNONAA >60 10/06/2022 1035     No results found for: "CEA1", "CEA" / No results found for: "CEA1", "CEA" No results found for: "PSA1" No results found for: "ZOX096" No results found for: "EAV409"  Lab Results  Component  Value Date   TOTALPROTELP 8.2 09/11/2021   ALBUMINELP 4.6 (H) 09/11/2021   A1GS 0.2 09/11/2021   A2GS 0.5 09/11/2021   BETS 1.0 09/11/2021   GAMS 1.9 (H) 09/11/2021   MSPIKE Not Observed 09/11/2021   SPEI Comment 09/11/2021   Lab Results  Component Value Date   TIBC 202 (L) 09/07/2023   TIBC 200 (L) 06/15/2023   TIBC 222 (L) 05/25/2023   FERRITIN 2,113 (H) 09/07/2023   FERRITIN 2,198 (H) 06/15/2023   FERRITIN 1,749 (H) 05/25/2023   IRONPCTSAT 83 (H) 09/07/2023   IRONPCTSAT 77 (H) 06/15/2023   IRONPCTSAT 83 (H) 05/25/2023   Lab Results  Component Value Date   LDH 142 06/08/2022   LDH 174 04/05/2022   LDH 160 09/11/2021     STUDIES:   MR LIVER W WO CONTRAST Result Date: 09/01/2023 CLINICAL DATA:  Elevated ferritin, evaluate for liver iron deposition EXAM: MRI ABDOMEN WITHOUT AND WITH CONTRAST TECHNIQUE: Multiplanar multisequence MR imaging of the abdomen was performed both before and after the administration of intravenous contrast. CONTRAST:  10mL GADAVIST GADOBUTROL 1 MMOL/ML IV SOLN COMPARISON:  None Available. FINDINGS: Lower chest: No acute abnormality. Hepatobiliary: Profound hepatic signal hypointensity on both in and opposed phase imaging, with signal inversion on inphase  imaging and marked intrinsic T2 hypointensity. Fat signal fraction 19.7%. R2* = 534/s, calculated hepatic iron content 17.1 mg/g. Irregular parenchymal scarring and focal fatty sparing of the posterior right lobe of the liver (series 63, image 40). No gallstones, gallbladder wall thickening, or biliary dilatation. Pancreas: Unremarkable. No pancreatic ductal dilatation or surrounding inflammatory changes. Spleen: Normal in size without significant abnormality. Adrenals/Urinary Tract: Adrenal glands are unremarkable. Kidneys are normal, without renal calculi, solid lesion, or hydronephrosis. Stomach/Bowel: Stomach is within normal limits. No evidence of bowel wall thickening, distention, or inflammatory  changes. Vascular/Lymphatic: No significant vascular findings are present. No enlarged abdominal lymph nodes. Other: No abdominal wall hernia or abnormality. No ascites. Musculoskeletal: No acute or significant osseous findings. IMPRESSION: 1. Hepatic signal characteristics consistent with severe hepatic steatosis and marked hepatic iron deposition. Fat signal fraction 19.7%. R2 = 534/s, calculated hepatic iron content 17.1 mg/g. 2. No acute findings in the abdomen. Electronically Signed   By: Jearld Lesch M.D.   On: 09/01/2023 17:06

## 2023-09-07 ENCOUNTER — Encounter: Payer: Self-pay | Admitting: Hematology

## 2023-09-07 ENCOUNTER — Inpatient Hospital Stay: Payer: 59

## 2023-09-07 ENCOUNTER — Inpatient Hospital Stay (HOSPITAL_BASED_OUTPATIENT_CLINIC_OR_DEPARTMENT_OTHER): Payer: 59 | Admitting: Hematology

## 2023-09-07 VITALS — BP 163/84 | HR 63 | Temp 98.2°F | Resp 18 | Wt 242.5 lb

## 2023-09-07 DIAGNOSIS — Z803 Family history of malignant neoplasm of breast: Secondary | ICD-10-CM | POA: Diagnosis not present

## 2023-09-07 DIAGNOSIS — Z79899 Other long term (current) drug therapy: Secondary | ICD-10-CM | POA: Diagnosis not present

## 2023-09-07 DIAGNOSIS — K760 Fatty (change of) liver, not elsewhere classified: Secondary | ICD-10-CM | POA: Diagnosis not present

## 2023-09-07 DIAGNOSIS — D461 Refractory anemia with ring sideroblasts: Secondary | ICD-10-CM | POA: Diagnosis not present

## 2023-09-07 DIAGNOSIS — Z8249 Family history of ischemic heart disease and other diseases of the circulatory system: Secondary | ICD-10-CM | POA: Diagnosis not present

## 2023-09-07 DIAGNOSIS — R7989 Other specified abnormal findings of blood chemistry: Secondary | ICD-10-CM

## 2023-09-07 DIAGNOSIS — Z833 Family history of diabetes mellitus: Secondary | ICD-10-CM | POA: Diagnosis not present

## 2023-09-07 DIAGNOSIS — D469 Myelodysplastic syndrome, unspecified: Secondary | ICD-10-CM

## 2023-09-07 DIAGNOSIS — D75839 Thrombocytosis, unspecified: Secondary | ICD-10-CM | POA: Diagnosis not present

## 2023-09-07 DIAGNOSIS — Z882 Allergy status to sulfonamides status: Secondary | ICD-10-CM | POA: Diagnosis not present

## 2023-09-07 DIAGNOSIS — D649 Anemia, unspecified: Secondary | ICD-10-CM

## 2023-09-07 DIAGNOSIS — Z886 Allergy status to analgesic agent status: Secondary | ICD-10-CM | POA: Diagnosis not present

## 2023-09-07 DIAGNOSIS — Z9049 Acquired absence of other specified parts of digestive tract: Secondary | ICD-10-CM | POA: Diagnosis not present

## 2023-09-07 DIAGNOSIS — Z800 Family history of malignant neoplasm of digestive organs: Secondary | ICD-10-CM | POA: Diagnosis not present

## 2023-09-07 DIAGNOSIS — I10 Essential (primary) hypertension: Secondary | ICD-10-CM | POA: Diagnosis not present

## 2023-09-07 LAB — FERRITIN: Ferritin: 2113 ng/mL — ABNORMAL HIGH (ref 24–336)

## 2023-09-07 LAB — CBC WITH DIFFERENTIAL/PLATELET
Abs Immature Granulocytes: 0.01 10*3/uL (ref 0.00–0.07)
Basophils Absolute: 0.1 10*3/uL (ref 0.0–0.1)
Basophils Relative: 1 %
Eosinophils Absolute: 0.1 10*3/uL (ref 0.0–0.5)
Eosinophils Relative: 2 %
HCT: 38.4 % — ABNORMAL LOW (ref 39.0–52.0)
Hemoglobin: 12.7 g/dL — ABNORMAL LOW (ref 13.0–17.0)
Immature Granulocytes: 0 %
Lymphocytes Relative: 44 %
Lymphs Abs: 2.5 10*3/uL (ref 0.7–4.0)
MCH: 32.6 pg (ref 26.0–34.0)
MCHC: 33.1 g/dL (ref 30.0–36.0)
MCV: 98.7 fL (ref 80.0–100.0)
Monocytes Absolute: 0.6 10*3/uL (ref 0.1–1.0)
Monocytes Relative: 12 %
Neutro Abs: 2.3 10*3/uL (ref 1.7–7.7)
Neutrophils Relative %: 41 %
Platelets: 389 10*3/uL (ref 150–400)
RBC: 3.89 MIL/uL — ABNORMAL LOW (ref 4.22–5.81)
RDW: 26.7 % — ABNORMAL HIGH (ref 11.5–15.5)
Smear Review: ADEQUATE
WBC: 5.5 10*3/uL (ref 4.0–10.5)
nRBC: 0.7 % — ABNORMAL HIGH (ref 0.0–0.2)

## 2023-09-07 LAB — HEPATIC FUNCTION PANEL
ALT: 32 U/L (ref 0–44)
AST: 30 U/L (ref 15–41)
Albumin: 4.1 g/dL (ref 3.5–5.0)
Alkaline Phosphatase: 66 U/L (ref 38–126)
Bilirubin, Direct: 0.2 mg/dL (ref 0.0–0.2)
Indirect Bilirubin: 1.2 mg/dL — ABNORMAL HIGH (ref 0.3–0.9)
Total Bilirubin: 1.4 mg/dL — ABNORMAL HIGH (ref 0.0–1.2)
Total Protein: 8 g/dL (ref 6.5–8.1)

## 2023-09-07 LAB — IRON AND TIBC
Iron: 167 ug/dL (ref 45–182)
Saturation Ratios: 83 % — ABNORMAL HIGH (ref 17.9–39.5)
TIBC: 202 ug/dL — ABNORMAL LOW (ref 250–450)
UIBC: 35 ug/dL

## 2023-09-07 NOTE — Patient Instructions (Addendum)
[facility] Discharge Instructions   You were seen and examined today by Dr. Ellin Saba.  He reviewed the results of your lab work which are normal/stable. Your hemoglobin in 12.7 today. You will not need your shot today.   He reviewed the results of your MRI of the liver that is showing you have  We will see you back in 3 weeks.   Return as scheduled.    Thank you for choosing [facility] at [facility] to provide your oncology and hematology care.  To afford each patient quality time with our provider, please arrive at least 15 minutes before your scheduled appointment time.   If you have a lab appointment with the Cancer Center please come in thru the Main Entrance and check in at the main information desk.  You need to re-schedule your appointment should you arrive 10 or more minutes late.  We strive to give you quality time with our providers, and arriving late affects you and other patients whose appointments are after yours.  Also, if you no show three or more times for appointments you may be dismissed from the clinic at the providers discretion.     Again, thank you for choosing [facility].  Our hope is that these requests will decrease the amount of time that you wait before being seen by our physicians.       _____________________________________________________________  Should you have questions after your visit to [facility], please contact our office at [phone] and follow the prompts.  Our office hours are 8:00 a.m. and 4:30 p.m. Monday - Friday.  Please note that voicemails left after 4:00 p.m. may not be returned until the following business day.  We are closed weekends and major holidays.  You do have access to a nurse 24-7, just call the main number to the clinic [phone] and do not press any options, hold on the line and a nurse will answer the phone.    For prescription  refill requests, have your pharmacy contact our office and allow 72 hours.    Due to Covid, you will need to wear a mask upon entering the hospital. If you do not have a mask, a mask will be given to you at the Main Entrance upon arrival. For doctor visits, patients may have 1 support person age 45 or older with them. For treatment visits, patients can not have anyone with them due to social distancing guidelines and our immunocompromised population.

## 2023-09-07 NOTE — Progress Notes (Signed)
No injection today per provider.

## 2023-09-08 ENCOUNTER — Other Ambulatory Visit: Payer: Self-pay

## 2023-09-09 ENCOUNTER — Other Ambulatory Visit: Payer: Self-pay

## 2023-09-18 ENCOUNTER — Other Ambulatory Visit: Payer: Self-pay

## 2023-09-27 NOTE — Progress Notes (Signed)
[facility] [address] [place] [address]    Clinic Day:  09/28/2023  Referring physician: Elisabeth Cara, PA  Patient Care Team: Elisabeth Cara, [place] as PCP - General (Family Medicine) Doreatha Massed, MD as Medical Oncologist (Hematology)   ASSESSMENT & PLAN:   Assessment: 1. MDS with ringed sideroblasts and thrombocytosis: - Patient seen for normocytic anemia with CBC on 05/08/2021 showing hemoglobin 8.4, MCV 97 and platelet count 631. - Ferritin was 1454, percent saturation was 85.  Hemochromatosis was negative. - He is taking iron tablet 2 times daily for the last 1 year. - Colonoscopy was reportedly done in [place] 3 years ago, within normal limits. - BMBX (10/08/2021): Hypercellular marrow with erythroid hyperplasia, erythroid dysplasia and ring sideroblasts.  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 - IPSS M: Score is -0.73 (low risk).  IPSS-R score 2.5.  Median leukemia free survival was 5.9%.  Median overall survival was 6 years.  AML transformation rate was 1.7 %/year. - Ring sideroblasts were more than 15%.  JAK2 V617F and reflex testing and BCR/ABL by FISH were negative for thrombocytosis. - Serum EPO was 107. - Retacrit 30,000 units weekly started on 11/26/2021, dose changed to 40,000 units every 2 weeks on 12/31/2021, dose increased to 60,000 units weekly on 03/01/2022.  Last dose of Retacrit on 06/22/2022.  Luspatercept 1 mg/kg started on 06/15/2022.   2. 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 - Denies any smoking history. - Paternal aunt had pancreatic cancer.  Sister had breast cancer.  3.  Elevated ferritin levels: - Testing negative for C282Y and H63D - Expanded [employer] testing negative for 6 genes (FTH1, HAMP,HFE,HjV,SLC40A1,TFR2) - MRI liver  (08/30/2022): Severe hepatic steatosis with marked hepatic iron deposition.  Calculated hepatic iron content is 17.1 mg/g.    Plan: 1. MDS with ring sideroblasts and thrombocytosis: - Denies any bleeding per rectum or melena. - No significant side effects from Luspatercept. - CBC today shows hemoglobin 9.7, dropped from 12.7 on 09/07/2023.  Previous dose of Luspatercept was on 08/27/2023. - He will receive Luspatercept 1 mg/kg dose.  Will repeat CBC in 3 weeks.  2.  Elevated ferritin (hemochromatosis negative): - We reviewed results of [employer] testing for expanded panel which was negative. - We talked about starting him on Jadenu 360 mg 4 tablets daily on an empty stomach, same time every day. - We discussed side effects in detail. - Baseline LFTs today are within normal limits. - Will see him back in 3 weeks and repeat his CBC and CMP.  3.  Hypertension: - Continue lisinopril, HCTZ and Norvasc.  Blood pressure is 116/55.  4.  Fatigue: - Reported fatigue.  Will check testosterone and TSH levels.    Orders Placed This Encounter  Procedures   TSH    Standing Status:   Future    Expected Date:   10/19/2023    Expiration Date:   09/27/2024   Testosterone    Standing Status:   Future    Expected Date:   10/19/2023    Expiration Date:   09/27/2024   CBC with Differential    Standing Status:   Future    Expected Date:   10/19/2023    Expiration Date:   09/27/2024   Comprehensive metabolic panel    Standing Status:  Future    Expected Date:   10/19/2023    Expiration Date:   09/27/2024   CBC with Differential    Standing Status:   Future    Expected Date:   11/09/2023    Expiration Date:   11/09/2024   CBC with Differential    Standing Status:   Future    Expected Date:   11/30/2023    Expiration Date:   11/30/2024   CBC with Differential    Standing Status:   Future    Expected Date:   12/21/2023    Expiration Date:   12/21/2024   CBC with Differential    Standing Status:   Future     Expected Date:   01/11/2024    Expiration Date:   01/11/2025   CBC with Differential    Standing Status:   Future    Expected Date:   02/01/2024    Expiration Date:   02/01/2025      I,Katie Daubenspeck,acting as a scribe for Doreatha Massed, MD.,have documented all relevant documentation on the behalf of Doreatha Massed, MD,as directed by  Doreatha Massed, MD while in the presence of Doreatha Massed, MD.   I, Doreatha Massed MD, have reviewed the above documentation for accuracy and completeness, and I agree with the above.   Doreatha Massed, MD   2/19/202512:25 PM  CHIEF COMPLAINT:   Diagnosis: MDS   Cancer Staging  No matching staging information was found for the patient.    Prior Therapy: Retacrit 60,000 units weekly, 11/26/21 - 06/22/22  Current Therapy:  Luspatercept every 3 weeks    HISTORY OF PRESENT ILLNESS:   Oncology History  MDS (myelodysplastic syndrome) (HCC)  11/14/2021 Initial Diagnosis   MDS (myelodysplastic syndrome) (HCC)   06/15/2022 -  Chemotherapy   Patient is on Treatment Plan : MYELODYSPLASIA Luspatercept q21d        INTERVAL HISTORY:   Stephen Watts is a 63 y.o. male presenting to clinic today for follow up of MDS. He was last seen by me on 09/07/23.  Today, he states that he is doing well overall. His appetite level is at 100%. His energy level is at 50%.  PAST MEDICAL HISTORY:   Past Medical History: Past Medical History:  Diagnosis Date   Anemia    Diabetes (HCC)    Hypertension     Surgical History: Past Surgical History:  Procedure Laterality Date   APPENDECTOMY     age 57 years old    Social History: Social History   Socioeconomic History   Marital status: Widowed    Spouse name: Not on file   Number of children: Not on file   Years of education: Not on file   Highest education level: Not on file  Occupational History   Not on file  Tobacco Use   Smoking status: Never    Passive exposure: Never    Smokeless tobacco: Never  Substance and Sexual Activity   Alcohol use: Never   Drug use: Never   Sexual activity: Not on file  Other Topics Concern   Not on file  Social History Narrative   Not on file   Social Drivers of Health   Financial Resource Strain: Not on file  Food Insecurity: Not on file  Transportation Needs: Not on file  Physical Activity: Not on file  Stress: Not on file  Social Connections: Not on file  Intimate Partner Violence: Not on file    Family History: Family History  Problem Relation Age  of Onset   Heart disease Mother    Diabetes Sister     Current Medications:  Current Outpatient Medications:    amLODipine (NORVASC) 10 MG tablet, Take 10 mg by mouth daily., Disp: , Rfl:    Deferasirox (JADENU) 360 MG TABS, Take 4 tablets (1,440 mg total) by mouth daily. Take on empty stomach same time every day, Disp: 120 tablet, Rfl: 4   ferrous sulfate 325 (65 FE) MG tablet, Take 325 mg by mouth 2 (two) times a week., Disp: , Rfl:    glimepiride (AMARYL) 2 MG tablet, Take 2 mg by mouth daily with breakfast., Disp: , Rfl:    hydrochlorothiazide (HYDRODIURIL) 25 MG tablet, Take 25 mg by mouth daily., Disp: , Rfl:    levothyroxine (SYNTHROID) 125 MCG tablet, TAKE 1 TABLET BY MOUTH MONDAY THRU SATURDAY, Disp: , Rfl:    lisinopril (ZESTRIL) 40 MG tablet, Take 40 mg by mouth daily., Disp: , Rfl:    metFORMIN (GLUCOPHAGE) 1000 MG tablet, Take 1,000 mg by mouth 2 (two) times daily., Disp: , Rfl:    metoprolol succinate (TOPROL-XL) 50 MG 24 hr tablet, Take 50 mg by mouth daily., Disp: , Rfl:    polyethylene glycol-electrolytes (NULYTELY) 420 g solution, Take 4,000 mLs by mouth once., Disp: , Rfl:    sildenafil (VIAGRA) 50 MG tablet, Take 50 mg by mouth daily as needed., Disp: , Rfl:    sitaGLIPtin (JANUVIA) 100 MG tablet, Take 100 mg by mouth daily., Disp: , Rfl:  No current facility-administered medications for this visit.  Facility-Administered Medications Ordered in  Other Visits:    acetaminophen (TYLENOL) 325 MG tablet, , , ,    loratadine (CLARITIN) 10 MG tablet, , , ,    Allergies: Allergies  Allergen Reactions   Ibuprofen Hives   Sulfa Antibiotics Rash    REVIEW OF SYSTEMS:   Review of Systems  Constitutional:  Negative for chills, fatigue and fever.  HENT:   Negative for lump/mass, mouth sores, nosebleeds, sore throat and trouble swallowing.   Eyes:  Negative for eye problems.  Respiratory:  Positive for shortness of breath. Negative for cough.   Cardiovascular:  Negative for chest pain, leg swelling and palpitations.  Gastrointestinal:  Negative for abdominal pain, constipation, diarrhea, nausea and vomiting.  Genitourinary:  Negative for bladder incontinence, difficulty urinating, dysuria, frequency, hematuria and nocturia.   Musculoskeletal:  Negative for arthralgias, back pain, flank pain, myalgias and neck pain.  Skin:  Negative for itching and rash.  Neurological:  Negative for dizziness, headaches and numbness.  Hematological:  Does not bruise/bleed easily.  Psychiatric/Behavioral:  Negative for depression, sleep disturbance and suicidal ideas. The patient is not nervous/anxious.   All other systems reviewed and are negative.    VITALS:   Blood pressure (!) 116/55, pulse 86, temperature 98.1 F (36.7 C), temperature source Oral, resp. rate 18, weight 238 lb 15.7 oz (108.4 kg), SpO2 99%.  Wt Readings from Last 3 Encounters:  09/28/23 238 lb 15.7 oz (108.4 kg)  09/07/23 242 lb 8.1 oz (110 kg)  06/15/23 239 lb 8 oz (108.6 kg)    Body mass index is 37.43 kg/m.  Performance status (ECOG): 1 - Symptomatic but completely ambulatory  PHYSICAL EXAM:   Physical Exam Vitals and nursing note reviewed. Exam conducted with a chaperone present.  Constitutional:      Appearance: Normal appearance.  Cardiovascular:     Rate and Rhythm: Normal rate and regular rhythm.     Pulses: Normal pulses.  Heart sounds: Normal heart sounds.   Pulmonary:     Effort: Pulmonary effort is normal.     Breath sounds: Normal breath sounds.  Abdominal:     Palpations: Abdomen is soft. There is no hepatomegaly, splenomegaly or mass.     Tenderness: There is no abdominal tenderness.  Musculoskeletal:     Right lower leg: No edema.     Left lower leg: No edema.  Lymphadenopathy:     Cervical: No cervical adenopathy.     Right cervical: No superficial, deep or posterior cervical adenopathy.    Left cervical: No superficial, deep or posterior cervical adenopathy.     Upper Body:     Right upper body: No supraclavicular or axillary adenopathy.     Left upper body: No supraclavicular or axillary adenopathy.  Neurological:     General: No focal deficit present.     Mental Status: He is alert and oriented to person, place, and time.  Psychiatric:        Mood and Affect: Mood normal.        Behavior: Behavior normal.     LABS:   CBC     Component Value Date/Time   WBC 6.8 09/28/2023 0838   RBC 3.05 (L) 09/28/2023 0838   HGB 9.7 (L) 09/28/2023 0838   HCT 29.0 (L) 09/28/2023 0838   PLT 558 (H) 09/28/2023 0838   MCV 95.1 09/28/2023 0838   MCH 31.8 09/28/2023 0838   MCHC 33.4 09/28/2023 0838   RDW 25.4 (H) 09/28/2023 0838   LYMPHSABS 2.5 09/07/2023 0932   MONOABS 0.6 09/07/2023 0932   EOSABS 0.1 09/07/2023 0932   BASOSABS 0.1 09/07/2023 0932    CMP      Component Value Date/Time   NA 135 09/28/2023 0838   K 3.8 09/28/2023 0838   CL 97 (L) 09/28/2023 0838   CO2 26 09/28/2023 0838   GLUCOSE 390 (H) 09/28/2023 0838   BUN 16 09/28/2023 0838   CREATININE 1.19 09/28/2023 0838   CALCIUM 9.4 09/28/2023 0838   PROT 8.2 (H) 09/28/2023 0838   ALBUMIN 4.1 09/28/2023 0838   AST 26 09/28/2023 0838   ALT 31 09/28/2023 0838   ALKPHOS 75 09/28/2023 0838   BILITOT 1.0 09/28/2023 0838   GFRNONAA >60 09/28/2023 0838     No results found for: "CEA1", "CEA" / No results found for: "CEA1", "CEA" No results found for: "PSA1" No  results found for: "ZOX096" No results found for: "EAV409"  Lab Results  Component Value Date   TOTALPROTELP 8.2 09/11/2021   ALBUMINELP 4.6 (H) 09/11/2021   A1GS 0.2 09/11/2021   A2GS 0.5 09/11/2021   BETS 1.0 09/11/2021   GAMS 1.9 (H) 09/11/2021   MSPIKE Not Observed 09/11/2021   SPEI Comment 09/11/2021   Lab Results  Component Value Date   TIBC 198 (L) 09/28/2023   TIBC 202 (L) 09/07/2023   TIBC 200 (L) 06/15/2023   FERRITIN 1,663 (H) 09/28/2023   FERRITIN 2,113 (H) 09/07/2023   FERRITIN 2,198 (H) 06/15/2023   IRONPCTSAT 88 (H) 09/28/2023   IRONPCTSAT 83 (H) 09/07/2023   IRONPCTSAT 77 (H) 06/15/2023   Lab Results  Component Value Date   LDH 142 06/08/2022   LDH 174 04/05/2022   LDH 160 09/11/2021     STUDIES:   MR LIVER W WO CONTRAST Result Date: 09/01/2023 CLINICAL DATA:  Elevated ferritin, evaluate for liver iron deposition EXAM: MRI ABDOMEN WITHOUT AND WITH CONTRAST TECHNIQUE: Multiplanar multisequence MR imaging of the abdomen  was performed both before and after the administration of intravenous contrast. CONTRAST:  10mL GADAVIST GADOBUTROL 1 MMOL/ML IV SOLN COMPARISON:  None Available. FINDINGS: Lower chest: No acute abnormality. Hepatobiliary: Profound hepatic signal hypointensity on both in and opposed phase imaging, with signal inversion on inphase imaging and marked intrinsic T2 hypointensity. Fat signal fraction 19.7%. R2* = 534/s, calculated hepatic iron content 17.1 mg/g. Irregular parenchymal scarring and focal fatty sparing of the posterior right lobe of the liver (series 63, image 40). No gallstones, gallbladder wall thickening, or biliary dilatation. Pancreas: Unremarkable. No pancreatic ductal dilatation or surrounding inflammatory changes. Spleen: Normal in size without significant abnormality. Adrenals/Urinary Tract: Adrenal glands are unremarkable. Kidneys are normal, without renal calculi, solid lesion, or hydronephrosis. Stomach/Bowel: Stomach is within  normal limits. No evidence of bowel wall thickening, distention, or inflammatory changes. Vascular/Lymphatic: No significant vascular findings are present. No enlarged abdominal lymph nodes. Other: No abdominal wall hernia or abnormality. No ascites. Musculoskeletal: No acute or significant osseous findings. IMPRESSION: 1. Hepatic signal characteristics consistent with severe hepatic steatosis and marked hepatic iron deposition. Fat signal fraction 19.7%. R2 = 534/s, calculated hepatic iron content 17.1 mg/g. 2. No acute findings in the abdomen. Electronically Signed   By: Jearld Lesch M.D.   On: 09/01/2023 17:06

## 2023-09-28 ENCOUNTER — Inpatient Hospital Stay: Payer: 59 | Attending: Hematology

## 2023-09-28 ENCOUNTER — Telehealth: Payer: Self-pay | Admitting: Pharmacy Technician

## 2023-09-28 ENCOUNTER — Telehealth: Payer: Self-pay

## 2023-09-28 ENCOUNTER — Inpatient Hospital Stay (HOSPITAL_BASED_OUTPATIENT_CLINIC_OR_DEPARTMENT_OTHER): Payer: 59 | Admitting: Hematology

## 2023-09-28 ENCOUNTER — Other Ambulatory Visit (HOSPITAL_COMMUNITY): Payer: Self-pay

## 2023-09-28 ENCOUNTER — Encounter: Payer: Self-pay | Admitting: Hematology

## 2023-09-28 ENCOUNTER — Inpatient Hospital Stay: Payer: 59

## 2023-09-28 VITALS — BP 116/55 | HR 86 | Temp 98.1°F | Resp 18 | Wt 239.0 lb

## 2023-09-28 DIAGNOSIS — K760 Fatty (change of) liver, not elsewhere classified: Secondary | ICD-10-CM | POA: Diagnosis not present

## 2023-09-28 DIAGNOSIS — I10 Essential (primary) hypertension: Secondary | ICD-10-CM | POA: Insufficient documentation

## 2023-09-28 DIAGNOSIS — Z803 Family history of malignant neoplasm of breast: Secondary | ICD-10-CM | POA: Insufficient documentation

## 2023-09-28 DIAGNOSIS — Z882 Allergy status to sulfonamides status: Secondary | ICD-10-CM | POA: Diagnosis not present

## 2023-09-28 DIAGNOSIS — Z79899 Other long term (current) drug therapy: Secondary | ICD-10-CM | POA: Diagnosis not present

## 2023-09-28 DIAGNOSIS — D461 Refractory anemia with ring sideroblasts: Secondary | ICD-10-CM | POA: Insufficient documentation

## 2023-09-28 DIAGNOSIS — Z833 Family history of diabetes mellitus: Secondary | ICD-10-CM | POA: Diagnosis not present

## 2023-09-28 DIAGNOSIS — Z8249 Family history of ischemic heart disease and other diseases of the circulatory system: Secondary | ICD-10-CM | POA: Insufficient documentation

## 2023-09-28 DIAGNOSIS — D469 Myelodysplastic syndrome, unspecified: Secondary | ICD-10-CM

## 2023-09-28 DIAGNOSIS — R7989 Other specified abnormal findings of blood chemistry: Secondary | ICD-10-CM | POA: Diagnosis not present

## 2023-09-28 DIAGNOSIS — D75839 Thrombocytosis, unspecified: Secondary | ICD-10-CM | POA: Diagnosis not present

## 2023-09-28 DIAGNOSIS — Z9049 Acquired absence of other specified parts of digestive tract: Secondary | ICD-10-CM | POA: Insufficient documentation

## 2023-09-28 DIAGNOSIS — Z886 Allergy status to analgesic agent status: Secondary | ICD-10-CM | POA: Diagnosis not present

## 2023-09-28 DIAGNOSIS — Z800 Family history of malignant neoplasm of digestive organs: Secondary | ICD-10-CM | POA: Insufficient documentation

## 2023-09-28 DIAGNOSIS — R5383 Other fatigue: Secondary | ICD-10-CM | POA: Diagnosis not present

## 2023-09-28 LAB — CBC
HCT: 29 % — ABNORMAL LOW (ref 39.0–52.0)
Hemoglobin: 9.7 g/dL — ABNORMAL LOW (ref 13.0–17.0)
MCH: 31.8 pg (ref 26.0–34.0)
MCHC: 33.4 g/dL (ref 30.0–36.0)
MCV: 95.1 fL (ref 80.0–100.0)
Platelets: 558 10*3/uL — ABNORMAL HIGH (ref 150–400)
RBC: 3.05 MIL/uL — ABNORMAL LOW (ref 4.22–5.81)
RDW: 25.4 % — ABNORMAL HIGH (ref 11.5–15.5)
WBC: 6.8 10*3/uL (ref 4.0–10.5)
nRBC: 0 % (ref 0.0–0.2)

## 2023-09-28 LAB — IRON AND TIBC
Iron: 174 ug/dL (ref 45–182)
Saturation Ratios: 88 % — ABNORMAL HIGH (ref 17.9–39.5)
TIBC: 198 ug/dL — ABNORMAL LOW (ref 250–450)
UIBC: 24 ug/dL

## 2023-09-28 LAB — COMPREHENSIVE METABOLIC PANEL
ALT: 31 U/L (ref 0–44)
AST: 26 U/L (ref 15–41)
Albumin: 4.1 g/dL (ref 3.5–5.0)
Alkaline Phosphatase: 75 U/L (ref 38–126)
Anion gap: 12 (ref 5–15)
BUN: 16 mg/dL (ref 8–23)
CO2: 26 mmol/L (ref 22–32)
Calcium: 9.4 mg/dL (ref 8.9–10.3)
Chloride: 97 mmol/L — ABNORMAL LOW (ref 98–111)
Creatinine, Ser: 1.19 mg/dL (ref 0.61–1.24)
GFR, Estimated: >60 mL/min (ref >60)
Glucose, Bld: 390 mg/dL — ABNORMAL HIGH (ref 70–99)
Potassium: 3.8 mmol/L (ref 3.5–5.1)
Sodium: 135 mmol/L (ref 135–145)
Total Bilirubin: 1 mg/dL (ref 0.0–1.2)
Total Protein: 8.2 g/dL — ABNORMAL HIGH (ref 6.5–8.1)

## 2023-09-28 LAB — FERRITIN: Ferritin: 1663 ng/mL — ABNORMAL HIGH (ref 24–336)

## 2023-09-28 MED ORDER — LUSPATERCEPT-AAMT 75 MG ~~LOC~~ SOLR
0.9500 mg/kg | Freq: Once | SUBCUTANEOUS | Status: AC
  Administered 2023-09-28: 100 mg via SUBCUTANEOUS
  Filled 2023-09-28 (×2): qty 2

## 2023-09-28 MED ORDER — DEFERASIROX 360 MG PO TABS: 4.0000 | ORAL_TABLET | Freq: Every day | ORAL | 4 refills | Status: DC

## 2023-09-28 NOTE — Patient Instructions (Signed)
[facility] [place] - A DEPT OF [facility]  Discharge Instructions: Thank you for choosing [facility] to provide your oncology and hematology care.  If you have a lab appointment with the Cancer Center - please note that after April 8th, 2024, all labs will be drawn in the cancer center.  You do not have to check in or register with the main entrance as you have in the past but will complete your check-in in the cancer center.  Wear comfortable clothing and clothing appropriate for easy access to any Portacath or PICC line.   We strive to give you quality time with your provider. You may need to reschedule your appointment if you arrive late (15 or more minutes).  Arriving late affects you and other patients whose appointments are after yours.  Also, if you miss three or more appointments without notifying the office, you may be dismissed from the clinic at the provider's discretion.      For prescription refill requests, have your pharmacy contact our office and allow 72 hours for refills to be completed.    Today you received the following Reblozyl, return as scheduled.   To help prevent nausea and vomiting after your treatment, we encourage you to take your nausea medication as directed.  BELOW ARE SYMPTOMS THAT SHOULD BE REPORTED IMMEDIATELY: *FEVER GREATER THAN 100.4 F (38 C) OR HIGHER *CHILLS OR SWEATING *NAUSEA AND VOMITING THAT IS NOT CONTROLLED WITH YOUR NAUSEA MEDICATION *UNUSUAL SHORTNESS OF BREATH *UNUSUAL BRUISING OR BLEEDING *URINARY PROBLEMS (pain or burning when urinating, or frequent urination) *BOWEL PROBLEMS (unusual diarrhea, constipation, pain near the anus) TENDERNESS IN MOUTH AND THROAT WITH OR WITHOUT PRESENCE OF ULCERS (sore throat, sores in mouth, or a toothache) UNUSUAL RASH, SWELLING OR PAIN  UNUSUAL VAGINAL DISCHARGE OR ITCHING   Items with * indicate a potential emergency and should be followed up as soon as possible or  go to the Emergency Department if any problems should occur.  Please show the CHEMOTHERAPY ALERT CARD or IMMUNOTHERAPY ALERT CARD at check-in to the Emergency Department and triage nurse.  Should you have questions after your visit or need to cancel or reschedule your appointment, please contact [facility] [place] - A DEPT OF [facility] [phone]  and follow the prompts.  Office hours are 8:00 a.m. to 4:30 p.m. Monday - Friday. Please note that voicemails left after 4:00 p.m. may not be returned until the following business day.  We are closed weekends and major holidays. You have access to a nurse at all times for urgent questions. Please call the main number to the clinic [phone] and follow the prompts.  For any non-urgent questions, you may also contact your provider using MyChart. We now offer e-Visits for anyone 35 and older to request care online for non-urgent symptoms. For details visit [email or web address].   Also download the MyChart app! Go to the app store, search "MyChart", open the app, select [place], and log in with your MyChart username and password.

## 2023-09-28 NOTE — Progress Notes (Signed)
Patient presents today for Reblozyl injection. Hemoglobin reviewed prior to administration. VSS tolerated without incident or complaint. See MAR for details. Patient stable during and after injection. Patient discharged in satisfactory condition with no s/s of distress noted.

## 2023-09-28 NOTE — Telephone Encounter (Signed)
Oral Oncology Patient Advocate Encounter   Received notification that prior authorization for Deferasirox is required.   PA submitted on 09/28/23 Key [id number] Status is pending     Patty Almedia Balls, CPhT Oncology Pharmacy Patient Advocate [facility] Direct Number: [phone] Fax: [phone]

## 2023-09-28 NOTE — Patient Instructions (Addendum)
[facility] at [facility] Discharge Instructions   You were seen and examined today by Dr. Ellin Saba.  He reviewed the results of your lab work which are mostly normal/stable. Your hemoglobin is 9.7.   Dr. Kirtland Bouchard is prescribing you a pill called Jadenu. You will take 4 pills a day at the same time every day. This medication needs to be taken on an empty stomach. Wait at least 1 hour after taking these pills prior to eating.   We will proceed with your injection today.   Return as scheduled.    Thank you for choosing [facility] at [facility] to provide your oncology and hematology care.  To afford each patient quality time with our provider, please arrive at least 15 minutes before your scheduled appointment time.   If you have a lab appointment with the Cancer Center please come in thru the Main Entrance and check in at the main information desk.  You need to re-schedule your appointment should you arrive 10 or more minutes late.  We strive to give you quality time with our providers, and arriving late affects you and other patients whose appointments are after yours.  Also, if you no show three or more times for appointments you may be dismissed from the clinic at the providers discretion.     Again, thank you for choosing [facility].  Our hope is that these requests will decrease the amount of time that you wait before being seen by our physicians.       _____________________________________________________________  Should you have questions after your visit to [facility], please contact our office at [phone] and follow the prompts.  Our office hours are 8:00 a.m. and 4:30 p.m. Monday - Friday.  Please note that voicemails left after 4:00 p.m. may not be returned until the following business day.  We are closed weekends and major holidays.  You do have access to a nurse 24-7, just call the main number to the clinic  [phone] and do not press any options, hold on the line and a nurse will answer the phone.    For prescription refill requests, have your pharmacy contact our office and allow 72 hours.    Due to Covid, you will need to wear a mask upon entering the hospital. If you do not have a mask, a mask will be given to you at the Main Entrance upon arrival. For doctor visits, patients may have 1 support person age 65 or older with them. For treatment visits, patients can not have anyone with them due to social distancing guidelines and our immunocompromised population.

## 2023-09-28 NOTE — Telephone Encounter (Signed)
Oral Oncology Pharmacist Encounter  Received new prescription for Jadenu (deferasirox) for the treatment of iron overload, planned duration depended upon serum ferritin measurements and hepatic iron concentrations.  CBC/CMP from 09/28/23 assessed, no baseline dose adjustments required.  Current medication list in Epic reviewed, no DDIs with Jadenu identified.   Evaluated chart and no patient barriers to medication adherence noted.   Prescription has been e-scribed to the [place] for benefits analysis and approval.  Oral Oncology Clinic will continue to follow for insurance authorization, copayment issues, initial counseling and start date.  Jerry Caras, PharmD PGY2 Oncology Pharmacy Resident   09/28/2023 2:12 PM

## 2023-09-30 ENCOUNTER — Other Ambulatory Visit: Payer: Self-pay

## 2023-10-01 ENCOUNTER — Other Ambulatory Visit: Payer: Self-pay

## 2023-10-03 NOTE — Telephone Encounter (Signed)
 Oral Oncology Patient Advocate Encounter   An urgent appeal for the prior authorization denial of Deferasirox has been started.   Submitted Fer[employer] via e-fax on 09/29/2023 to [phone]. Per denial "ferritin level must be consistently greater than 1000 micrograms per liter" and they are.   This encounter will continue to be updated until final appeal determination.      Patty Almedia Balls, CPhT Oncology Pharmacy Patient Advocate [facility] Direct Number: [phone] Fax: [phone]

## 2023-10-05 ENCOUNTER — Other Ambulatory Visit (HOSPITAL_COMMUNITY): Payer: Self-pay

## 2023-10-05 NOTE — Telephone Encounter (Signed)
 Oral Oncology Patient Advocate Encounter  Called to get an update on PA, per representative case is still pending.  His case number is: [id number]  I will continue to check on the status until final determination  Patty Almedia Balls, CPhT Oncology Pharmacy Patient Advocate [facility] Direct Number: [phone] Fax: [phone]

## 2023-10-07 ENCOUNTER — Other Ambulatory Visit: Payer: Self-pay | Admitting: Pharmacist

## 2023-10-07 ENCOUNTER — Other Ambulatory Visit (HOSPITAL_COMMUNITY): Payer: Self-pay

## 2023-10-07 MED ORDER — DEFERASIROX 360 MG PO TABS: 1440.0000 mg | ORAL_TABLET | Freq: Every day | ORAL | 4 refills | Status: DC

## 2023-10-07 NOTE — Telephone Encounter (Addendum)
 Oral Oncology Patient Advocate Encounter  Prior Authorization for Deferasirox has been approved.    PA# [id number] Effective dates: 10/05/2023 through 04/03/2024.  Patient must [facility].  Script and all supporting documents sent to [facility].  Patty Almedia Balls, CPhT Oncology Pharmacy Patient Advocate [facility] Direct Number: [phone] Fax: [phone]

## 2023-10-11 NOTE — Telephone Encounter (Signed)
 Oral Oncology Patient Advocate Encounter  Contacted [facility] to check on status of pt's application, per representative, pt's medication is scheduled to arrive today 10/11/2023.   Patty Almedia Balls, CPhT Oncology Pharmacy Patient Advocate [facility] Direct Number: [phone] Fax: [phone]

## 2023-10-18 ENCOUNTER — Other Ambulatory Visit: Payer: Self-pay

## 2023-10-18 NOTE — Progress Notes (Signed)
 [facility] [address], [place] [address]    Clinic Day:  10/19/2023  Referring physician: Elisabeth Cara, PA  Patient Care Team: Elisabeth Cara, [place] as PCP - General (Family Medicine) Doreatha Massed, MD as Medical Oncologist (Hematology)   ASSESSMENT & PLAN:   Assessment: 1. MDS with ringed sideroblasts and thrombocytosis: - Patient seen for normocytic anemia with CBC on 05/08/2021 showing hemoglobin 8.4, MCV 97 and platelet count 631. - Ferritin was 1454, percent saturation was 85.  Hemochromatosis was negative. - He is taking iron tablet 2 times daily for the last 1 year. - Colonoscopy was reportedly done in [place] 3 years ago, within normal limits. - BMBX (10/08/2021): Hypercellular marrow with erythroid hyperplasia, erythroid dysplasia and ring sideroblasts.  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 - IPSS M: Score is -0.73 (low risk).  IPSS-R score 2.5.  Median leukemia free survival was 5.9%.  Median overall survival was 6 years.  AML transformation rate was 1.7 %/year. - Ring sideroblasts were more than 15%.  JAK2 V617F and reflex testing and BCR/ABL by FISH were negative for thrombocytosis. - Serum EPO was 107. - Retacrit 30,000 units weekly started on 11/26/2021, dose changed to 40,000 units every 2 weeks on 12/31/2021, dose increased to 60,000 units weekly on 03/01/2022.  Last dose of Retacrit on 06/22/2022.  Luspatercept 1 mg/kg started on 06/15/2022.   2. 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 - Denies any smoking history. - Paternal aunt had pancreatic cancer.  Sister had breast cancer.   3.  Elevated ferritin levels: - Testing negative for C282Y and H63D - Expanded [employer] testing negative for 6 genes (FTH1, HAMP,HFE,HjV,SLC40A1,TFR2) - MRI liver  (08/30/2022): Severe hepatic steatosis with marked hepatic iron deposition.  Calculated hepatic iron content is 17.1 mg/g.    Plan: 1. MDS with ring sideroblasts and thrombocytosis: - Last Luspatercept 1 mg/kg 3 weeks ago.  Hemoglobin today improved 11.7 from 9.7. - He does not report any side effects from Luspatercept.  He may proceed with injection today at the same dose.  RTC 3 weeks for follow-up.  2.  Elevated ferritin (hemochromatosis negative): - He started Jadenu 360 mg 4 tablets daily on 10/13/2023.  So far denies any GI symptoms. - Labs today shows normal LFTs.  Last ferritin was 1663 and saturation 88.  Continue Jadenu same dose.  Will see him back in 3 weeks for follow-up.  3.  Hypertension: - Continue lisinopril, HCTZ and Norvasc.  Blood pressure is 130/72.   4.  Fatigue: - TSH level is normal.    Orders Placed This Encounter  Procedures   CBC with Differential    Standing Status:   Future    Expected Date:   11/09/2023    Expiration Date:   10/18/2024   Comprehensive metabolic panel    Standing Status:   Future    Expected Date:   11/09/2023    Expiration Date:   10/18/2024   Iron and TIBC ([facility] ONLY)    Standing Status:   Future    Expected Date:   11/09/2023    Expiration Date:   10/18/2024   Ferritin    Standing Status:   Future    Expected Date:   11/09/2023    Expiration Date:   10/18/2024  I,Katie Daubenspeck,acting as a Neurosurgeon for Doreatha Massed, MD.,have documented all relevant documentation on the behalf of Doreatha Massed, MD,as directed by  Doreatha Massed, MD while in the presence of Doreatha Massed, MD.   I, Doreatha Massed MD, have reviewed the above documentation for accuracy and completeness, and I agree with the above.   Doreatha Massed, MD   3/12/20256:13 PM  CHIEF COMPLAINT:   Diagnosis: MDS    Cancer Staging  No matching staging information was found for the patient.    Prior Therapy:  Retacrit 60,000 units weekly, 11/26/21 - 06/22/22   Current Therapy:  Luspatercept every 3 weeks    HISTORY OF PRESENT ILLNESS:   Oncology History  MDS (myelodysplastic syndrome) (HCC)  11/14/2021 Initial Diagnosis   MDS (myelodysplastic syndrome) (HCC)   06/15/2022 -  Chemotherapy   Patient is on Treatment Plan : MYELODYSPLASIA Luspatercept q21d        INTERVAL HISTORY:   Stephen Watts is a 63 y.o. male presenting to clinic today for follow up of MDS. He was last seen by me on 09/28/23.  Today, he states that he is doing well overall. His appetite level is at 100%. His energy level is at 50%.  PAST MEDICAL HISTORY:   Past Medical History: Past Medical History:  Diagnosis Date   Anemia    Diabetes (HCC)    Hypertension     Surgical History: Past Surgical History:  Procedure Laterality Date   APPENDECTOMY     age 51 years old    Social History: Social History   Socioeconomic History   Marital status: Widowed    Spouse name: Not on file   Number of children: Not on file   Years of education: Not on file   Highest education level: Not on file  Occupational History   Not on file  Tobacco Use   Smoking status: Never    Passive exposure: Never   Smokeless tobacco: Never  Substance and Sexual Activity   Alcohol use: Never   Drug use: Never   Sexual activity: Not on file  Other Topics Concern   Not on file  Social History Narrative   Not on file   Social Drivers of Health   Financial Resource Strain: Not on file  Food Insecurity: Not on file  Transportation Needs: Not on file  Physical Activity: Not on file  Stress: Not on file  Social Connections: Not on file  Intimate Partner Violence: Not on file    Family History: Family History  Problem Relation Age of Onset   Heart disease Mother    Diabetes Sister     Current Medications:  Current Outpatient Medications:    amLODipine (NORVASC) 10 MG tablet, Take 10 mg by mouth daily., Disp: , Rfl:     Deferasirox (JADENU) 360 MG TABS, Take 4 tablets (1,440 mg total) by mouth daily. Take on empty stomach., Disp: 120 tablet, Rfl: 4   ferrous sulfate 325 (65 FE) MG tablet, Take 325 mg by mouth 2 (two) times a week., Disp: , Rfl:    glimepiride (AMARYL) 2 MG tablet, Take 2 mg by mouth daily with breakfast., Disp: , Rfl:    hydrochlorothiazide (HYDRODIURIL) 25 MG tablet, Take 25 mg by mouth daily., Disp: , Rfl:    levothyroxine (SYNTHROID) 125 MCG tablet, TAKE 1 TABLET BY MOUTH MONDAY THRU SATURDAY, Disp: , Rfl:    lisinopril (ZESTRIL) 40 MG tablet, Take 40 mg by mouth daily., Disp: , Rfl:    metFORMIN (GLUCOPHAGE) 1000  MG tablet, Take 1,000 mg by mouth 2 (two) times daily., Disp: , Rfl:    metoprolol succinate (TOPROL-XL) 50 MG 24 hr tablet, Take 50 mg by mouth daily., Disp: , Rfl:    polyethylene glycol-electrolytes (NULYTELY) 420 g solution, Take 4,000 mLs by mouth once., Disp: , Rfl:    sildenafil (VIAGRA) 50 MG tablet, Take 50 mg by mouth daily as needed., Disp: , Rfl:    sitaGLIPtin (JANUVIA) 100 MG tablet, Take 100 mg by mouth daily., Disp: , Rfl:  No current facility-administered medications for this visit.  Facility-Administered Medications Ordered in Other Visits:    acetaminophen (TYLENOL) 325 MG tablet, , , ,    loratadine (CLARITIN) 10 MG tablet, , , ,    Allergies: Allergies  Allergen Reactions   Ibuprofen Hives   Sulfa Antibiotics Rash    REVIEW OF SYSTEMS:   Review of Systems  Constitutional:  Negative for chills, fatigue and fever.  HENT:   Negative for lump/mass, mouth sores, nosebleeds, sore throat and trouble swallowing.   Eyes:  Negative for eye problems.  Respiratory:  Negative for cough and shortness of breath.   Cardiovascular:  Negative for chest pain, leg swelling and palpitations.  Gastrointestinal:  Negative for abdominal pain, constipation, diarrhea, nausea and vomiting.  Genitourinary:  Negative for bladder incontinence, difficulty urinating, dysuria,  frequency, hematuria and nocturia.   Musculoskeletal:  Negative for arthralgias, back pain, flank pain, myalgias and neck pain.  Skin:  Negative for itching and rash.  Neurological:  Negative for dizziness, headaches and numbness.  Hematological:  Does not bruise/bleed easily.  Psychiatric/Behavioral:  Negative for depression, sleep disturbance and suicidal ideas. The patient is not nervous/anxious.   All other systems reviewed and are negative.    VITALS:   Blood pressure 131/72, pulse 74, temperature 98.1 F (36.7 C), temperature source Oral, resp. rate 16, weight 240 lb 11.9 oz (109.2 kg), SpO2 98%.  Wt Readings from Last 3 Encounters:  10/19/23 240 lb 11.9 oz (109.2 kg)  09/28/23 238 lb 15.7 oz (108.4 kg)  09/07/23 242 lb 8.1 oz (110 kg)    Body mass index is 37.71 kg/m.  Performance status (ECOG): 1 - Symptomatic but completely ambulatory  PHYSICAL EXAM:   Physical Exam Vitals and nursing note reviewed. Exam conducted with a chaperone present.  Constitutional:      Appearance: Normal appearance.  Cardiovascular:     Rate and Rhythm: Normal rate and regular rhythm.     Pulses: Normal pulses.     Heart sounds: Normal heart sounds.  Pulmonary:     Effort: Pulmonary effort is normal.     Breath sounds: Normal breath sounds.  Abdominal:     Palpations: Abdomen is soft. There is no hepatomegaly, splenomegaly or mass.     Tenderness: There is no abdominal tenderness.  Musculoskeletal:     Right lower leg: No edema.     Left lower leg: No edema.  Lymphadenopathy:     Cervical: No cervical adenopathy.     Right cervical: No superficial, deep or posterior cervical adenopathy.    Left cervical: No superficial, deep or posterior cervical adenopathy.     Upper Body:     Right upper body: No supraclavicular or axillary adenopathy.     Left upper body: No supraclavicular or axillary adenopathy.  Neurological:     General: No focal deficit present.     Mental Status: He is  alert and oriented to person, place, and time.  Psychiatric:  Mood and Affect: Mood normal.        Behavior: Behavior normal.     LABS:   CBC     Component Value Date/Time   WBC 4.3 10/19/2023 0844   RBC 3.55 (L) 10/19/2023 0844   HGB 11.7 (L) 10/19/2023 0844   HCT 34.7 (L) 10/19/2023 0844   PLT 412 (H) 10/19/2023 0844   MCV 97.7 10/19/2023 0844   MCH 33.0 10/19/2023 0844   MCHC 33.7 10/19/2023 0844   RDW 26.2 (H) 10/19/2023 0844   LYMPHSABS 2.0 10/19/2023 0844   MONOABS 0.7 10/19/2023 0844   EOSABS 0.1 10/19/2023 0844   BASOSABS 0.1 10/19/2023 0844    CMP      Component Value Date/Time   NA 135 10/19/2023 0844   K 3.9 10/19/2023 0844   CL 99 10/19/2023 0844   CO2 28 10/19/2023 0844   GLUCOSE 227 (H) 10/19/2023 0844   BUN 13 10/19/2023 0844   CREATININE 0.95 10/19/2023 0844   CALCIUM 9.7 10/19/2023 0844   PROT 8.1 10/19/2023 0844   ALBUMIN 4.1 10/19/2023 0844   AST 20 10/19/2023 0844   ALT 24 10/19/2023 0844   ALKPHOS 66 10/19/2023 0844   BILITOT 1.8 (H) 10/19/2023 0844   GFRNONAA >60 10/19/2023 0844     No results found for: "CEA1", "CEA" / No results found for: "CEA1", "CEA" No results found for: "PSA1" No results found for: "CAN199" No results found for: "CAN125"  Lab Results  Component Value Date   TOTALPROTELP 8.2 09/11/2021   ALBUMINELP 4.6 (H) 09/11/2021   A1GS 0.2 09/11/2021   A2GS 0.5 09/11/2021   BETS 1.0 09/11/2021   GAMS 1.9 (H) 09/11/2021   MSPIKE Not Observed 09/11/2021   SPEI Comment 09/11/2021   Lab Results  Component Value Date   TIBC 198 (L) 09/28/2023   TIBC 202 (L) 09/07/2023   TIBC 200 (L) 06/15/2023   FERRITIN 1,663 (H) 09/28/2023   FERRITIN 2,113 (H) 09/07/2023   FERRITIN 2,198 (H) 06/15/2023   IRONPCTSAT 88 (H) 09/28/2023   IRONPCTSAT 83 (H) 09/07/2023   IRONPCTSAT 77 (H) 06/15/2023   Lab Results  Component Value Date   LDH 142 06/08/2022   LDH 174 04/05/2022   LDH 160 09/11/2021     STUDIES:   No  results found.

## 2023-10-19 ENCOUNTER — Inpatient Hospital Stay: Payer: 59 | Attending: Hematology

## 2023-10-19 ENCOUNTER — Inpatient Hospital Stay (HOSPITAL_BASED_OUTPATIENT_CLINIC_OR_DEPARTMENT_OTHER): Payer: 59 | Admitting: Hematology

## 2023-10-19 ENCOUNTER — Inpatient Hospital Stay: Payer: 59

## 2023-10-19 VITALS — BP 131/72 | HR 74 | Temp 98.1°F | Resp 16 | Wt 240.7 lb

## 2023-10-19 DIAGNOSIS — I10 Essential (primary) hypertension: Secondary | ICD-10-CM | POA: Diagnosis not present

## 2023-10-19 DIAGNOSIS — R5383 Other fatigue: Secondary | ICD-10-CM

## 2023-10-19 DIAGNOSIS — Z9049 Acquired absence of other specified parts of digestive tract: Secondary | ICD-10-CM | POA: Insufficient documentation

## 2023-10-19 DIAGNOSIS — Z800 Family history of malignant neoplasm of digestive organs: Secondary | ICD-10-CM | POA: Diagnosis not present

## 2023-10-19 DIAGNOSIS — D461 Refractory anemia with ring sideroblasts: Secondary | ICD-10-CM | POA: Insufficient documentation

## 2023-10-19 DIAGNOSIS — D75839 Thrombocytosis, unspecified: Secondary | ICD-10-CM | POA: Diagnosis not present

## 2023-10-19 DIAGNOSIS — Z882 Allergy status to sulfonamides status: Secondary | ICD-10-CM | POA: Insufficient documentation

## 2023-10-19 DIAGNOSIS — Z833 Family history of diabetes mellitus: Secondary | ICD-10-CM | POA: Insufficient documentation

## 2023-10-19 DIAGNOSIS — Z886 Allergy status to analgesic agent status: Secondary | ICD-10-CM | POA: Diagnosis not present

## 2023-10-19 DIAGNOSIS — D469 Myelodysplastic syndrome, unspecified: Secondary | ICD-10-CM

## 2023-10-19 DIAGNOSIS — Z803 Family history of malignant neoplasm of breast: Secondary | ICD-10-CM | POA: Insufficient documentation

## 2023-10-19 DIAGNOSIS — Z79899 Other long term (current) drug therapy: Secondary | ICD-10-CM | POA: Insufficient documentation

## 2023-10-19 DIAGNOSIS — Z8249 Family history of ischemic heart disease and other diseases of the circulatory system: Secondary | ICD-10-CM | POA: Insufficient documentation

## 2023-10-19 LAB — CBC WITH DIFFERENTIAL/PLATELET
Abs Immature Granulocytes: 0.01 10*3/uL (ref 0.00–0.07)
Basophils Absolute: 0.1 10*3/uL (ref 0.0–0.1)
Basophils Relative: 1 %
Eosinophils Absolute: 0.1 10*3/uL (ref 0.0–0.5)
Eosinophils Relative: 3 %
HCT: 34.7 % — ABNORMAL LOW (ref 39.0–52.0)
Hemoglobin: 11.7 g/dL — ABNORMAL LOW (ref 13.0–17.0)
Immature Granulocytes: 0 %
Lymphocytes Relative: 48 %
Lymphs Abs: 2 10*3/uL (ref 0.7–4.0)
MCH: 33 pg (ref 26.0–34.0)
MCHC: 33.7 g/dL (ref 30.0–36.0)
MCV: 97.7 fL (ref 80.0–100.0)
Monocytes Absolute: 0.7 10*3/uL (ref 0.1–1.0)
Monocytes Relative: 17 %
Neutro Abs: 1.3 10*3/uL — ABNORMAL LOW (ref 1.7–7.7)
Neutrophils Relative %: 31 %
Platelets: 412 10*3/uL — ABNORMAL HIGH (ref 150–400)
RBC: 3.55 MIL/uL — ABNORMAL LOW (ref 4.22–5.81)
RDW: 26.2 % — ABNORMAL HIGH (ref 11.5–15.5)
Smear Review: INCREASED
WBC: 4.3 10*3/uL (ref 4.0–10.5)
nRBC: 0 % (ref 0.0–0.2)

## 2023-10-19 LAB — COMPREHENSIVE METABOLIC PANEL WITH GFR
ALT: 24 U/L (ref 0–44)
AST: 20 U/L (ref 15–41)
Albumin: 4.1 g/dL (ref 3.5–5.0)
Alkaline Phosphatase: 66 U/L (ref 38–126)
Anion gap: 8 (ref 5–15)
BUN: 13 mg/dL (ref 8–23)
CO2: 28 mmol/L (ref 22–32)
Calcium: 9.7 mg/dL (ref 8.9–10.3)
Chloride: 99 mmol/L (ref 98–111)
Creatinine, Ser: 0.95 mg/dL (ref 0.61–1.24)
GFR, Estimated: >60 mL/min
Glucose, Bld: 227 mg/dL — ABNORMAL HIGH (ref 70–99)
Potassium: 3.9 mmol/L (ref 3.5–5.1)
Sodium: 135 mmol/L (ref 135–145)
Total Bilirubin: 1.8 mg/dL — ABNORMAL HIGH (ref 0.0–1.2)
Total Protein: 8.1 g/dL (ref 6.5–8.1)

## 2023-10-19 LAB — TSH: TSH: 2.449 u[IU]/mL (ref 0.350–4.500)

## 2023-10-19 MED ORDER — LUSPATERCEPT-AAMT 75 MG ~~LOC~~ SOLR
75.0000 mg | Freq: Once | SUBCUTANEOUS | Status: AC
  Administered 2023-10-19: 75 mg via SUBCUTANEOUS
  Filled 2023-10-19: qty 1.5

## 2023-10-19 NOTE — Patient Instructions (Signed)
 [facility] at [facility] Discharge Instructions   You were seen and examined today by Dr. Ellin Saba.  He reviewed the results of your lab work which are normal/stable.   We will proceed with your injection today.   Return as scheduled.      Thank you for choosing [facility] at [facility] to provide your oncology and hematology care.  To afford each patient quality time with our provider, please arrive at least 15 minutes before your scheduled appointment time.   If you have a lab appointment with the Cancer Center please come in thru the Main Entrance and check in at the main information desk.  You need to re-schedule your appointment should you arrive 10 or more minutes late.  We strive to give you quality time with our providers, and arriving late affects you and other patients whose appointments are after yours.  Also, if you no show three or more times for appointments you may be dismissed from the clinic at the providers discretion.     Again, thank you for choosing [facility].  Our hope is that these requests will decrease the amount of time that you wait before being seen by our physicians.       _____________________________________________________________  Should you have questions after your visit to [facility], please contact our office at [phone] and follow the prompts.  Our office hours are 8:00 a.m. and 4:30 p.m. Monday - Friday.  Please note that voicemails left after 4:00 p.m. may not be returned until the following business day.  We are closed weekends and major holidays.  You do have access to a nurse 24-7, just call the main number to the clinic [phone] and do not press any options, hold on the line and a nurse will answer the phone.    For prescription refill requests, have your pharmacy contact our office and allow 72 hours.    Due to Covid, you will need to wear a mask upon  entering the hospital. If you do not have a mask, a mask will be given to you at the Main Entrance upon arrival. For doctor visits, patients may have 1 support person age 24 or older with them. For treatment visits, patients can not have anyone with them due to social distancing guidelines and our immunocompromised population.

## 2023-10-19 NOTE — Patient Instructions (Signed)
 [facility] [place] - A DEPT OF [facility]  Discharge Instructions: Thank you for choosing [facility] to provide your oncology and hematology care.  If you have a lab appointment with the Cancer Center - please note that after April 8th, 2024, all labs will be drawn in the cancer center.  You do not have to check in or register with the main entrance as you have in the past but will complete your check-in in the cancer center.  Wear comfortable clothing and clothing appropriate for easy access to any Portacath or PICC line.   We strive to give you quality time with your provider. You may need to reschedule your appointment if you arrive late (15 or more minutes).  Arriving late affects you and other patients whose appointments are after yours.  Also, if you miss three or more appointments without notifying the office, you may be dismissed from the clinic at the provider's discretion.      For prescription refill requests, have your pharmacy contact our office and allow 72 hours for refills to be completed.    Today you received the following chemotherapy and/or immunotherapy agents Reblozyl      To help prevent nausea and vomiting after your treatment, we encourage you to take your nausea medication as directed.  BELOW ARE SYMPTOMS THAT SHOULD BE REPORTED IMMEDIATELY: *FEVER GREATER THAN 100.4 F (38 C) OR HIGHER *CHILLS OR SWEATING *NAUSEA AND VOMITING THAT IS NOT CONTROLLED WITH YOUR NAUSEA MEDICATION *UNUSUAL SHORTNESS OF BREATH *UNUSUAL BRUISING OR BLEEDING *URINARY PROBLEMS (pain or burning when urinating, or frequent urination) *BOWEL PROBLEMS (unusual diarrhea, constipation, pain near the anus) TENDERNESS IN MOUTH AND THROAT WITH OR WITHOUT PRESENCE OF ULCERS (sore throat, sores in mouth, or a toothache) UNUSUAL RASH, SWELLING OR PAIN  UNUSUAL VAGINAL DISCHARGE OR ITCHING   Items with * indicate a potential emergency and should be followed up  as soon as possible or go to the Emergency Department if any problems should occur.  Please show the CHEMOTHERAPY ALERT CARD or IMMUNOTHERAPY ALERT CARD at check-in to the Emergency Department and triage nurse.  Should you have questions after your visit or need to cancel or reschedule your appointment, please contact [facility] [place] - A DEPT OF [facility] [phone]  and follow the prompts.  Office hours are 8:00 a.m. to 4:30 p.m. Monday - Friday. Please note that voicemails left after 4:00 p.m. may not be returned until the following business day.  We are closed weekends and major holidays. You have access to a nurse at all times for urgent questions. Please call the main number to the clinic [phone] and follow the prompts.  For any non-urgent questions, you may also contact your provider using MyChart. We now offer e-Visits for anyone 55 and older to request care online for non-urgent symptoms. For details visit [email or web address].   Also download the MyChart app! Go to the app store, search "MyChart", open the app, select [place], and log in with your MyChart username and password.

## 2023-10-19 NOTE — Addendum Note (Signed)
 Addended by: Lawson Fiscal C on: 10/19/2023 11:02 AM   Modules accepted: Orders

## 2023-10-19 NOTE — Progress Notes (Addendum)
 Patient here today for Reblozyl injection per providers order.  Hgb noted to be 11.7, MD aware.  Message received from Chapman Moss RN/Dr. Ellin Saba patient okay for injection.  Stable during administration without incident; injection site WNL; see MAR for injection details.  Patient tolerated procedure well and without incident.  No questions or complaints noted at this time.

## 2023-10-20 LAB — TESTOSTERONE: Testosterone: 452 ng/dL (ref 264–916)

## 2023-11-07 NOTE — Progress Notes (Signed)
 [facility] [address], [place] [address]    Clinic Day:  11/08/2023  Referring physician: Elisabeth Cara, PA  Patient Care Team: Elisabeth Cara, [place] as PCP - General (Family Medicine) Doreatha Massed, MD as Medical Oncologist (Hematology)   ASSESSMENT & PLAN:   Assessment: 1. MDS with ringed sideroblasts and thrombocytosis: - Patient seen for normocytic anemia with CBC on 05/08/2021 showing hemoglobin 8.4, MCV 97 and platelet count 631. - Ferritin was 1454, percent saturation was 85.  Hemochromatosis was negative. - He is taking iron tablet 2 times daily for the last 1 year. - Colonoscopy was reportedly done in [place] 3 years ago, within normal limits. - BMBX (10/08/2021): Hypercellular marrow with erythroid hyperplasia, erythroid dysplasia and ring sideroblasts.  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 - IPSS M: Score is -0.73 (low risk).  IPSS-R score 2.5.  Median leukemia free survival was 5.9%.  Median overall survival was 6 years.  AML transformation rate was 1.7 %/year. - Ring sideroblasts were more than 15%.  JAK2 V617F and reflex testing and BCR/ABL by FISH were negative for thrombocytosis. - Serum EPO was 107. - Retacrit 30,000 units weekly started on 11/26/2021, dose changed to 40,000 units every 2 weeks on 12/31/2021, dose increased to 60,000 units weekly on 03/01/2022.  Last dose of Retacrit on 06/22/2022.  Luspatercept 1 mg/kg started on 06/15/2022.   2. 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 - Denies any smoking history. - Paternal aunt had pancreatic cancer.  Sister had breast cancer.   3.  Elevated ferritin levels: - Testing negative for C282Y and H63D - Expanded [employer] testing negative for 6 genes (FTH1, HAMP,HFE,HjV,SLC40A1,TFR2) - MRI liver  (08/30/2022): Severe hepatic steatosis with marked hepatic iron deposition.  Calculated hepatic iron content is 17.1 mg/g.    Plan: 1. MDS with ring sideroblasts and thrombocytosis: - Last Luspatercept 1 mg/kg was 3 weeks ago.  He denies any side effects from it. - CBC today: Hemoglobin increased to 12.4 from 11.7.  Platelet count is 434. - Will hold Luspatercept today.  Will check CBC in 3 weeks.  Will give Luspatercept if hemoglobin is below 11.5. - RTC 6 weeks for follow-up with labs.  2.  Elevated ferritin (hemochromatosis negative): - Jadenu 360 mg 4 tablets daily started on 10/13/2023.  Denies any GI symptoms. - Labs today shows ferritin improved to 1275 from 1664.  LFTs are normal.  Creatinine is normal. - Recommend continuing Jadenu 360 mg 4 tablets daily.  I will plan to repeat ferritin and iron panel in 6 weeks.  3.  Hypertension: - Continue lisinopril, HCTZ and Norvasc.  Blood pressure today is 160/76.   4.  Fatigue: - This has improved.  Previous TSH was 2.4 and testosterone level was 452.    Orders Placed This Encounter  Procedures   CBC with Differential    Standing Status:   Future    Expected Date:   12/20/2023    Expiration Date:   11/07/2024   Comprehensive metabolic panel    Standing Status:   Future    Expected Date:   12/20/2023    Expiration Date:   11/07/2024   Iron and TIBC ([facility] ONLY)    Standing Status:   Future    Expected Date:   12/20/2023  Expiration Date:   11/07/2024   Ferritin    Standing Status:   Future    Expected Date:   12/20/2023    Expiration Date:   11/07/2024      Mikeal Hawthorne R Teague,acting as a scribe for Doreatha Massed, MD.,have documented all relevant documentation on the behalf of Doreatha Massed, MD,as directed by  Doreatha Massed, MD while in the presence of Doreatha Massed, MD.  I, Doreatha Massed MD, have reviewed the above documentation for accuracy and completeness, and I agree with the  above.    Doreatha Massed, MD   4/1/202511:25 AM  CHIEF COMPLAINT:   Diagnosis: MDS    Cancer Staging  No matching staging information was found for the patient.    Prior Therapy: Retacrit 60,000 units weekly, 11/26/21 - 06/22/22   Current Therapy:  Luspatercept every 3 weeks    HISTORY OF PRESENT ILLNESS:   Oncology History  MDS (myelodysplastic syndrome) (HCC)  11/14/2021 Initial Diagnosis   MDS (myelodysplastic syndrome) (HCC)   06/15/2022 -  Chemotherapy   Patient is on Treatment Plan : MYELODYSPLASIA Luspatercept q21d        INTERVAL HISTORY:   Stephen Watts is a 63 y.o. male presenting to clinic today for follow up of MDS. He was last seen by me on 10/19/23.  Today, he states that he is doing well overall. His appetite level is at 100%. His energy level is at 75%.  PAST MEDICAL HISTORY:   Past Medical History: Past Medical History:  Diagnosis Date   Anemia    Diabetes (HCC)    Hypertension     Surgical History: Past Surgical History:  Procedure Laterality Date   APPENDECTOMY     age 37 years old    Social History: Social History   Socioeconomic History   Marital status: Widowed    Spouse name: Not on file   Number of children: Not on file   Years of education: Not on file   Highest education level: Not on file  Occupational History   Not on file  Tobacco Use   Smoking status: Never    Passive exposure: Never   Smokeless tobacco: Never  Substance and Sexual Activity   Alcohol use: Never   Drug use: Never   Sexual activity: Not on file  Other Topics Concern   Not on file  Social History Narrative   Not on file   Social Drivers of Health   Financial Resource Strain: Not on file  Food Insecurity: Not on file  Transportation Needs: Not on file  Physical Activity: Not on file  Stress: Not on file  Social Connections: Not on file  Intimate Partner Violence: Not on file    Family History: Family History  Problem Relation Age of Onset    Heart disease Mother    Diabetes Sister     Current Medications:  Current Outpatient Medications:    amLODipine (NORVASC) 10 MG tablet, Take 10 mg by mouth daily., Disp: , Rfl:    Deferasirox (JADENU) 360 MG TABS, Take 4 tablets (1,440 mg total) by mouth daily. Take on empty stomach., Disp: 120 tablet, Rfl: 4   glimepiride (AMARYL) 2 MG tablet, Take 2 mg by mouth daily with breakfast., Disp: , Rfl:    hydrochlorothiazide (HYDRODIURIL) 25 MG tablet, Take 25 mg by mouth daily., Disp: , Rfl:    levothyroxine (SYNTHROID) 125 MCG tablet, TAKE 1 TABLET BY MOUTH MONDAY THRU SATURDAY, Disp: , Rfl:    lisinopril (ZESTRIL) 40 MG tablet,  Take 40 mg by mouth daily., Disp: , Rfl:    metFORMIN (GLUCOPHAGE) 1000 MG tablet, Take 1,000 mg by mouth 2 (two) times daily., Disp: , Rfl:    metoprolol succinate (TOPROL-XL) 50 MG 24 hr tablet, Take 50 mg by mouth daily., Disp: , Rfl:    polyethylene glycol-electrolytes (NULYTELY) 420 g solution, Take 4,000 mLs by mouth once., Disp: , Rfl:    sildenafil (VIAGRA) 50 MG tablet, Take 50 mg by mouth daily as needed., Disp: , Rfl:    sitaGLIPtin (JANUVIA) 100 MG tablet, Take 100 mg by mouth daily., Disp: , Rfl:    ferrous sulfate 325 (65 FE) MG tablet, Take 325 mg by mouth 2 (two) times a week. (Patient not taking: Reported on 11/08/2023), Disp: , Rfl:  No current facility-administered medications for this visit.  Facility-Administered Medications Ordered in Other Visits:    acetaminophen (TYLENOL) 325 MG tablet, , , ,    loratadine (CLARITIN) 10 MG tablet, , , ,    Allergies: Allergies  Allergen Reactions   Ibuprofen Hives   Sulfa Antibiotics Rash    REVIEW OF SYSTEMS:   Review of Systems  Constitutional:  Negative for chills, fatigue and fever.  HENT:   Negative for lump/mass, mouth sores, nosebleeds, sore throat and trouble swallowing.   Eyes:  Negative for eye problems.  Respiratory:  Negative for cough and shortness of breath.   Cardiovascular:  Negative  for chest pain, leg swelling and palpitations.  Gastrointestinal:  Negative for abdominal pain, constipation, diarrhea, nausea and vomiting.  Genitourinary:  Negative for bladder incontinence, difficulty urinating, dysuria, frequency, hematuria and nocturia.   Musculoskeletal:  Negative for arthralgias, back pain, flank pain, myalgias and neck pain.  Skin:  Negative for itching and rash.  Neurological:  Negative for dizziness, headaches and numbness.  Hematological:  Does not bruise/bleed easily.  Psychiatric/Behavioral:  Negative for depression, sleep disturbance and suicidal ideas. The patient is not nervous/anxious.   All other systems reviewed and are negative.    VITALS:   Blood pressure (!) 162/76, pulse 72, temperature 97.7 F (36.5 C), temperature source Oral, resp. rate 16, weight 241 lb 10 oz (109.6 kg), SpO2 98%.  Wt Readings from Last 3 Encounters:  11/08/23 241 lb 10 oz (109.6 kg)  10/19/23 240 lb 11.9 oz (109.2 kg)  09/28/23 238 lb 15.7 oz (108.4 kg)    Body mass index is 37.84 kg/m.  Performance status (ECOG): 1 - Symptomatic but completely ambulatory  PHYSICAL EXAM:   Physical Exam Vitals and nursing note reviewed. Exam conducted with a chaperone present.  Constitutional:      Appearance: Normal appearance.  Cardiovascular:     Rate and Rhythm: Normal rate and regular rhythm.     Pulses: Normal pulses.     Heart sounds: Normal heart sounds.  Pulmonary:     Effort: Pulmonary effort is normal.     Breath sounds: Normal breath sounds.  Abdominal:     Palpations: Abdomen is soft. There is no hepatomegaly, splenomegaly or mass.     Tenderness: There is no abdominal tenderness.  Musculoskeletal:     Right lower leg: No edema.     Left lower leg: No edema.  Lymphadenopathy:     Cervical: No cervical adenopathy.     Right cervical: No superficial, deep or posterior cervical adenopathy.    Left cervical: No superficial, deep or posterior cervical adenopathy.      Upper Body:     Right upper body: No supraclavicular  or axillary adenopathy.     Left upper body: No supraclavicular or axillary adenopathy.  Neurological:     General: No focal deficit present.     Mental Status: He is alert and oriented to person, place, and time.  Psychiatric:        Mood and Affect: Mood normal.        Behavior: Behavior normal.     LABS:   CBC     Component Value Date/Time   WBC 5.8 11/08/2023 0852   RBC 3.87 (L) 11/08/2023 0852   HGB 12.4 (L) 11/08/2023 0852   HCT 37.7 (L) 11/08/2023 0852   PLT 434 (H) 11/08/2023 0852   MCV 97.4 11/08/2023 0852   MCH 32.0 11/08/2023 0852   MCHC 32.9 11/08/2023 0852   RDW 25.5 (H) 11/08/2023 0852   LYMPHSABS 2.2 11/08/2023 0852   MONOABS 0.9 11/08/2023 0852   EOSABS 0.1 11/08/2023 0852   BASOSABS 0.1 11/08/2023 0852    CMP      Component Value Date/Time   NA 134 (L) 11/08/2023 0852   K 3.7 11/08/2023 0852   CL 98 11/08/2023 0852   CO2 27 11/08/2023 0852   GLUCOSE 274 (H) 11/08/2023 0852   BUN 19 11/08/2023 0852   CREATININE 1.09 11/08/2023 0852   CALCIUM 9.5 11/08/2023 0852   PROT 8.0 11/08/2023 0852   ALBUMIN 4.0 11/08/2023 0852   AST 18 11/08/2023 0852   ALT 24 11/08/2023 0852   ALKPHOS 89 11/08/2023 0852   BILITOT 1.7 (H) 11/08/2023 0852   GFRNONAA >60 11/08/2023 0852     No results found for: "CEA1", "CEA" / No results found for: "CEA1", "CEA" No results found for: "PSA1" No results found for: "ZOX096" No results found for: "CAN125"  Lab Results  Component Value Date   TOTALPROTELP 8.2 09/11/2021   ALBUMINELP 4.6 (H) 09/11/2021   A1GS 0.2 09/11/2021   A2GS 0.5 09/11/2021   BETS 1.0 09/11/2021   GAMS 1.9 (H) 09/11/2021   MSPIKE Not Observed 09/11/2021   SPEI Comment 09/11/2021   Lab Results  Component Value Date   TIBC 240 (L) 11/08/2023   TIBC 198 (L) 09/28/2023   TIBC 202 (L) 09/07/2023   FERRITIN 1,275 (H) 11/08/2023   FERRITIN 1,663 (H) 09/28/2023   FERRITIN 2,113 (H) 09/07/2023    IRONPCTSAT 102 (H) 11/08/2023   IRONPCTSAT 88 (H) 09/28/2023   IRONPCTSAT 83 (H) 09/07/2023   Lab Results  Component Value Date   LDH 142 06/08/2022   LDH 174 04/05/2022   LDH 160 09/11/2021     STUDIES:   No results found.

## 2023-11-08 ENCOUNTER — Inpatient Hospital Stay (HOSPITAL_BASED_OUTPATIENT_CLINIC_OR_DEPARTMENT_OTHER): Admitting: Hematology

## 2023-11-08 ENCOUNTER — Inpatient Hospital Stay: Attending: Hematology

## 2023-11-08 ENCOUNTER — Inpatient Hospital Stay

## 2023-11-08 ENCOUNTER — Other Ambulatory Visit: Payer: Self-pay

## 2023-11-08 VITALS — BP 162/76 | HR 72 | Temp 97.7°F | Resp 16 | Wt 241.6 lb

## 2023-11-08 DIAGNOSIS — Z886 Allergy status to analgesic agent status: Secondary | ICD-10-CM | POA: Insufficient documentation

## 2023-11-08 DIAGNOSIS — Z800 Family history of malignant neoplasm of digestive organs: Secondary | ICD-10-CM | POA: Insufficient documentation

## 2023-11-08 DIAGNOSIS — Z882 Allergy status to sulfonamides status: Secondary | ICD-10-CM | POA: Insufficient documentation

## 2023-11-08 DIAGNOSIS — Z803 Family history of malignant neoplasm of breast: Secondary | ICD-10-CM | POA: Insufficient documentation

## 2023-11-08 DIAGNOSIS — Z8249 Family history of ischemic heart disease and other diseases of the circulatory system: Secondary | ICD-10-CM | POA: Diagnosis not present

## 2023-11-08 DIAGNOSIS — D469 Myelodysplastic syndrome, unspecified: Secondary | ICD-10-CM | POA: Diagnosis not present

## 2023-11-08 DIAGNOSIS — R5383 Other fatigue: Secondary | ICD-10-CM | POA: Insufficient documentation

## 2023-11-08 DIAGNOSIS — D75839 Thrombocytosis, unspecified: Secondary | ICD-10-CM | POA: Insufficient documentation

## 2023-11-08 DIAGNOSIS — Z9049 Acquired absence of other specified parts of digestive tract: Secondary | ICD-10-CM | POA: Insufficient documentation

## 2023-11-08 DIAGNOSIS — Z79899 Other long term (current) drug therapy: Secondary | ICD-10-CM | POA: Insufficient documentation

## 2023-11-08 DIAGNOSIS — Z833 Family history of diabetes mellitus: Secondary | ICD-10-CM | POA: Insufficient documentation

## 2023-11-08 DIAGNOSIS — D461 Refractory anemia with ring sideroblasts: Secondary | ICD-10-CM | POA: Diagnosis present

## 2023-11-08 DIAGNOSIS — I10 Essential (primary) hypertension: Secondary | ICD-10-CM | POA: Insufficient documentation

## 2023-11-08 LAB — CBC WITH DIFFERENTIAL/PLATELET
Abs Immature Granulocytes: 0.01 10*3/uL (ref 0.00–0.07)
Basophils Absolute: 0.1 10*3/uL (ref 0.0–0.1)
Basophils Relative: 1 %
Eosinophils Absolute: 0.1 10*3/uL (ref 0.0–0.5)
Eosinophils Relative: 2 %
HCT: 37.7 % — ABNORMAL LOW (ref 39.0–52.0)
Hemoglobin: 12.4 g/dL — ABNORMAL LOW (ref 13.0–17.0)
Immature Granulocytes: 0 %
Lymphocytes Relative: 39 %
Lymphs Abs: 2.2 10*3/uL (ref 0.7–4.0)
MCH: 32 pg (ref 26.0–34.0)
MCHC: 32.9 g/dL (ref 30.0–36.0)
MCV: 97.4 fL (ref 80.0–100.0)
Monocytes Absolute: 0.9 10*3/uL (ref 0.1–1.0)
Monocytes Relative: 16 %
Neutro Abs: 2.4 10*3/uL (ref 1.7–7.7)
Neutrophils Relative %: 42 %
Platelets: 434 10*3/uL — ABNORMAL HIGH (ref 150–400)
RBC: 3.87 MIL/uL — ABNORMAL LOW (ref 4.22–5.81)
RDW: 25.5 % — ABNORMAL HIGH (ref 11.5–15.5)
Smear Review: INCREASED
WBC: 5.8 10*3/uL (ref 4.0–10.5)
nRBC: 0 % (ref 0.0–0.2)

## 2023-11-08 LAB — COMPREHENSIVE METABOLIC PANEL WITH GFR
ALT: 24 U/L (ref 0–44)
AST: 18 U/L (ref 15–41)
Albumin: 4 g/dL (ref 3.5–5.0)
Alkaline Phosphatase: 89 U/L (ref 38–126)
Anion gap: 9 (ref 5–15)
BUN: 19 mg/dL (ref 8–23)
CO2: 27 mmol/L (ref 22–32)
Calcium: 9.5 mg/dL (ref 8.9–10.3)
Chloride: 98 mmol/L (ref 98–111)
Creatinine, Ser: 1.09 mg/dL (ref 0.61–1.24)
GFR, Estimated: >60 mL/min (ref >60)
Glucose, Bld: 274 mg/dL — ABNORMAL HIGH (ref 70–99)
Potassium: 3.7 mmol/L (ref 3.5–5.1)
Sodium: 134 mmol/L — ABNORMAL LOW (ref 135–145)
Total Bilirubin: 1.7 mg/dL — ABNORMAL HIGH (ref 0.0–1.2)
Total Protein: 8 g/dL (ref 6.5–8.1)

## 2023-11-08 LAB — IRON AND TIBC
Iron: 245 ug/dL — ABNORMAL HIGH (ref 45–182)
Saturation Ratios: 102 % — ABNORMAL HIGH (ref 17.9–39.5)
TIBC: 240 ug/dL — ABNORMAL LOW (ref 250–450)

## 2023-11-08 LAB — FERRITIN: Ferritin: 1275 ng/mL — ABNORMAL HIGH (ref 24–336)

## 2023-11-08 NOTE — Patient Instructions (Addendum)
 [facility] at [facility] Discharge Instructions   You were seen and examined today by Dr. Ellin Saba.  He reviewed the results of your lab work which are normal/stable.   We will hold your Reblozyl today since your hemoglobin was 12.4 today.   We will see you back in 6 weeks. We will repeat lab work prior to this visit.   Return as scheduled.    Thank you for choosing [facility] at [facility] to provide your oncology and hematology care.  To afford each patient quality time with our provider, please arrive at least 15 minutes before your scheduled appointment time.   If you have a lab appointment with the Cancer Center please come in thru the Main Entrance and check in at the main information desk.  You need to re-schedule your appointment should you arrive 10 or more minutes late.  We strive to give you quality time with our providers, and arriving late affects you and other patients whose appointments are after yours.  Also, if you no show three or more times for appointments you may be dismissed from the clinic at the providers discretion.     Again, thank you for choosing [facility].  Our hope is that these requests will decrease the amount of time that you wait before being seen by our physicians.       _____________________________________________________________  Should you have questions after your visit to [facility], please contact our office at [phone] and follow the prompts.  Our office hours are 8:00 a.m. and 4:30 p.m. Monday - Friday.  Please note that voicemails left after 4:00 p.m. may not be returned until the following business day.  We are closed weekends and major holidays.  You do have access to a nurse 24-7, just call the main number to the clinic [phone] and do not press any options, hold on the line and a nurse will answer the phone.    For prescription refill requests, have your  pharmacy contact our office and allow 72 hours.    Due to Covid, you will need to wear a mask upon entering the hospital. If you do not have a mask, a mask will be given to you at the Main Entrance upon arrival. For doctor visits, patients may have 1 support person age 27 or older with them. For treatment visits, patients can not have anyone with them due to social distancing guidelines and our immunocompromised population.

## 2023-11-08 NOTE — Progress Notes (Signed)
Patient is taking Jadenu as prescribed. He has not missed any doses and reports no side effects at this time.

## 2023-11-08 NOTE — Progress Notes (Signed)
 Patient presents today for Reblozyl per provider's order. We will hold today per Dr.Katragadda.  Discharged from clinic ambulatory in stable condition. Alert and oriented x 3. F/U with [facility] as scheduled.

## 2023-11-09 ENCOUNTER — Inpatient Hospital Stay: Payer: 59

## 2023-11-09 ENCOUNTER — Inpatient Hospital Stay: Payer: 59 | Admitting: Hematology

## 2023-11-29 ENCOUNTER — Inpatient Hospital Stay

## 2023-11-29 DIAGNOSIS — D469 Myelodysplastic syndrome, unspecified: Secondary | ICD-10-CM

## 2023-11-30 ENCOUNTER — Inpatient Hospital Stay

## 2023-11-30 ENCOUNTER — Inpatient Hospital Stay: Admitting: Hematology

## 2023-11-30 VITALS — BP 140/74 | HR 77 | Temp 97.6°F | Resp 18

## 2023-11-30 DIAGNOSIS — E119 Type 2 diabetes mellitus without complications: Secondary | ICD-10-CM | POA: Diagnosis not present

## 2023-11-30 DIAGNOSIS — D469 Myelodysplastic syndrome, unspecified: Secondary | ICD-10-CM

## 2023-11-30 DIAGNOSIS — I10 Essential (primary) hypertension: Secondary | ICD-10-CM | POA: Diagnosis not present

## 2023-11-30 DIAGNOSIS — D461 Refractory anemia with ring sideroblasts: Secondary | ICD-10-CM | POA: Diagnosis not present

## 2023-11-30 LAB — CBC WITH DIFFERENTIAL/PLATELET
Abs Immature Granulocytes: 0.04 10*3/uL (ref 0.00–0.07)
Basophils Absolute: 0.1 10*3/uL (ref 0.0–0.1)
Basophils Relative: 1 %
Eosinophils Absolute: 0.1 10*3/uL (ref 0.0–0.5)
Eosinophils Relative: 2 %
HCT: 28.3 % — ABNORMAL LOW (ref 39.0–52.0)
Hemoglobin: 9.3 g/dL — ABNORMAL LOW (ref 13.0–17.0)
Immature Granulocytes: 1 %
Lymphocytes Relative: 31 %
Lymphs Abs: 2 10*3/uL (ref 0.7–4.0)
MCH: 31 pg (ref 26.0–34.0)
MCHC: 32.9 g/dL (ref 30.0–36.0)
MCV: 94.3 fL (ref 80.0–100.0)
Monocytes Absolute: 1.1 10*3/uL — ABNORMAL HIGH (ref 0.1–1.0)
Monocytes Relative: 17 %
Neutro Abs: 3.2 10*3/uL (ref 1.7–7.7)
Neutrophils Relative %: 48 %
Platelets: 591 10*3/uL — ABNORMAL HIGH (ref 150–400)
RBC: 3 MIL/uL — ABNORMAL LOW (ref 4.22–5.81)
RDW: 25.7 % — ABNORMAL HIGH (ref 11.5–15.5)
WBC: 6.5 10*3/uL (ref 4.0–10.5)
nRBC: 0.3 % — ABNORMAL HIGH (ref 0.0–0.2)

## 2023-11-30 MED ORDER — LUSPATERCEPT-AAMT 75 MG ~~LOC~~ SOLR
75.0000 mg | Freq: Once | SUBCUTANEOUS | Status: AC
  Administered 2023-11-30: 75 mg via SUBCUTANEOUS
  Filled 2023-11-30: qty 1.5

## 2023-11-30 NOTE — Progress Notes (Signed)
 Patient tolerated injection with no complaints voiced. Site clean and dry with no bruising or swelling noted at site. See MAR for details. Band aid applied.  Patient stable during and after injection. VSS with discharge and left in satisfactory condition with no s/s of distress noted.

## 2023-11-30 NOTE — Patient Instructions (Signed)
 [facility] - A DEPT OF [facility]  Discharge Instructions: Thank you for choosing [facility] to provide your oncology and hematology care.  If you have a lab appointment with the Cancer Center - please note that after April 8th, 2024, all labs will be drawn in the cancer center.  You do not have to check in or register with the main entrance as you have in the past but will complete your check-in in the cancer center.  Wear comfortable clothing and clothing appropriate for easy access to any Portacath or PICC line.   We strive to give you quality time with your provider. You may need to reschedule your appointment if you arrive late (15 or more minutes).  Arriving late affects you and other patients whose appointments are after yours.  Also, if you miss three or more appointments without notifying the office, you may be dismissed from the clinic at the provider's discretion.      For prescription refill requests, have your pharmacy contact our office and allow 72 hours for refills to be completed.    Today you received the following Reblozyl, return as scheduled.   To help prevent nausea and vomiting after your treatment, we encourage you to take your nausea medication as directed.  BELOW ARE SYMPTOMS THAT SHOULD BE REPORTED IMMEDIATELY: *FEVER GREATER THAN 100.4 F (38 C) OR HIGHER *CHILLS OR SWEATING *NAUSEA AND VOMITING THAT IS NOT CONTROLLED WITH YOUR NAUSEA MEDICATION *UNUSUAL SHORTNESS OF BREATH *UNUSUAL BRUISING OR BLEEDING *URINARY PROBLEMS (pain or burning when urinating, or frequent urination) *BOWEL PROBLEMS (unusual diarrhea, constipation, pain near the anus) TENDERNESS IN MOUTH AND THROAT WITH OR WITHOUT PRESENCE OF ULCERS (sore throat, sores in mouth, or a toothache) UNUSUAL RASH, SWELLING OR PAIN  UNUSUAL VAGINAL DISCHARGE OR ITCHING   Items with * indicate a potential emergency and should be followed up as soon as possible or  go to the Emergency Department if any problems should occur.  Please show the CHEMOTHERAPY ALERT CARD or IMMUNOTHERAPY ALERT CARD at check-in to the Emergency Department and triage nurse.  Should you have questions after your visit or need to cancel or reschedule your appointment, please contact [facility] [place] - A DEPT OF [facility] [phone]  and follow the prompts.  Office hours are 8:00 a.m. to 4:30 p.m. Monday - Friday. Please note that voicemails left after 4:00 p.m. may not be returned until the following business day.  We are closed weekends and major holidays. You have access to a nurse at all times for urgent questions. Please call the main number to the clinic [phone] and follow the prompts.  For any non-urgent questions, you may also contact your provider using MyChart. We now offer e-Visits for anyone 35 and older to request care online for non-urgent symptoms. For details visit [email or web address].   Also download the MyChart app! Go to the app store, search "MyChart", open the app, select [place], and log in with your MyChart username and password.

## 2023-12-07 ENCOUNTER — Other Ambulatory Visit: Payer: Self-pay

## 2023-12-19 NOTE — Progress Notes (Incomplete)
 [facility] [address] [place] [address]    Clinic Day:  12/19/2023  Referring physician: Alanna Hu, PA  Patient Care Team: Alanna Hu, [place] as PCP - General (Family Medicine) Paulett Boros, MD as Medical Oncologist (Hematology)   ASSESSMENT & PLAN:   Assessment: 1. MDS with ringed sideroblasts and thrombocytosis: - Patient seen for normocytic anemia with CBC on 05/08/2021 showing hemoglobin 8.4, MCV 97 and platelet count 631. - Ferritin was 1454, percent saturation was 85.  Hemochromatosis was negative. - He is taking iron tablet 2 times daily for the last 1 year. - Colonoscopy was reportedly done in [place] 3 years ago, within normal limits. - BMBX (10/08/2021): Hypercellular marrow with erythroid hyperplasia, erythroid dysplasia and ring sideroblasts.  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 - IPSS M: Score is -0.73 (low risk).  IPSS-R score 2.5.  Median leukemia free survival was 5.9%.  Median overall survival was 6 years.  AML transformation rate was 1.7 %/year. - Ring sideroblasts were more than 15%.  JAK2 V617F and reflex testing and BCR/ABL by FISH were negative for thrombocytosis. - Serum EPO was 107. - Retacrit  30,000 units weekly started on 11/26/2021, dose changed to 40,000 units every 2 weeks on 12/31/2021, dose increased to 60,000 units weekly on 03/01/2022.  Last dose of Retacrit  on 06/22/2022.  Luspatercept  1 mg/kg started on 06/15/2022.   2. 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 - Denies any smoking history. - Paternal aunt had pancreatic cancer.  Sister had breast cancer.   3.  Elevated ferritin levels: - Testing negative for C282Y and H63D - Expanded [employer] testing negative for 6 genes (FTH1, HAMP,HFE,HjV,SLC40A1,TFR2) - MRI liver  (08/30/2022): Severe hepatic steatosis with marked hepatic iron deposition.  Calculated hepatic iron content is 17.1 mg/g.    Plan: 1. MDS with ring sideroblasts and thrombocytosis: - Last Luspatercept  1 mg/kg was 3 weeks ago.  He denies any side effects from it. - CBC today: Hemoglobin increased to 12.4 from 11.7.  Platelet count is 434. - Will hold Luspatercept  today.  Will check CBC in 3 weeks.  Will give Luspatercept  if hemoglobin is below 11.5. - RTC 6 weeks for follow-up with labs.  2.  Elevated ferritin (hemochromatosis negative): - Jadenu  360 mg 4 tablets daily started on 10/13/2023.  Denies any GI symptoms. - Labs today shows ferritin improved to 1275 from 1664.  LFTs are normal.  Creatinine is normal. - Recommend continuing Jadenu  360 mg 4 tablets daily.  I will plan to repeat ferritin and iron panel in 6 weeks.  3.  Hypertension: - Continue lisinopril, HCTZ and Norvasc.  Blood pressure today is 160/76.   4.  Fatigue: - This has improved.  Previous TSH was 2.4 and testosterone  level was 452.    No orders of the defined types were placed in this encounter.     Nadeen Augusta Watts,acting as a Neurosurgeon for Paulett Boros, MD.,have documented all relevant documentation on the behalf of Paulett Boros, MD,as directed by  Paulett Boros, MD while in the presence of Paulett Boros, MD.  ***    Stephen Watts   5/12/20253:47 PM  CHIEF COMPLAINT:   Diagnosis: MDS    Cancer Staging  No matching staging information was found for the patient.    Prior Therapy: Retacrit   60,000 units weekly, 11/26/21 - 06/22/22   Current Therapy:  Luspatercept  every 3 weeks    HISTORY OF PRESENT ILLNESS:   Oncology History  MDS (myelodysplastic syndrome) (HCC)  11/14/2021 Initial Diagnosis   MDS (myelodysplastic syndrome) (HCC)   06/15/2022 -  Chemotherapy   Patient is on Treatment Plan : MYELODYSPLASIA Luspatercept  q21d        INTERVAL HISTORY:   Stephen Watts is a 63  y.o. male presenting to clinic today for follow up of MDS. He was last seen by me on 11/08/23.  Today, he states that he is doing well overall. His appetite level is at ***%. His energy level is at ***%.  PAST MEDICAL HISTORY:   Past Medical History: Past Medical History:  Diagnosis Date   Anemia    Diabetes (HCC)    Hypertension     Surgical History: Past Surgical History:  Procedure Laterality Date   APPENDECTOMY     age 65 years old    Social History: Social History   Socioeconomic History   Marital status: Widowed    Spouse name: Not on file   Number of children: Not on file   Years of education: Not on file   Highest education level: Not on file  Occupational History   Not on file  Tobacco Use   Smoking status: Never    Passive exposure: Never   Smokeless tobacco: Never  Substance and Sexual Activity   Alcohol use: Never   Drug use: Never   Sexual activity: Not on file  Other Topics Concern   Not on file  Social History Narrative   Not on file   Social Drivers of Health   Financial Resource Strain: Not on file  Food Insecurity: Not on file  Transportation Needs: Not on file  Physical Activity: Not on file  Stress: Not on file  Social Connections: Not on file  Intimate Partner Violence: Not on file    Family History: Family History  Problem Relation Age of Onset   Heart disease Mother    Diabetes Sister     Current Medications:  Current Outpatient Medications:    amLODipine (NORVASC) 10 MG tablet, Take 10 mg by mouth daily., Disp: , Rfl:    Deferasirox  (JADENU ) 360 MG TABS, Take 4 tablets (1,440 mg total) by mouth daily. Take on empty stomach., Disp: 120 tablet, Rfl: 4   ferrous sulfate 325 (65 FE) MG tablet, Take 325 mg by mouth 2 (two) times a week. (Patient not taking: Reported on 11/08/2023), Disp: , Rfl:    glimepiride (AMARYL) 2 MG tablet, Take 2 mg by mouth daily with breakfast., Disp: , Rfl:    hydrochlorothiazide (HYDRODIURIL) 25 MG  tablet, Take 25 mg by mouth daily., Disp: , Rfl:    levothyroxine (SYNTHROID) 125 MCG tablet, TAKE 1 TABLET BY MOUTH MONDAY THRU SATURDAY, Disp: , Rfl:    lisinopril (ZESTRIL) 40 MG tablet, Take 40 mg by mouth daily., Disp: , Rfl:    metFORMIN (GLUCOPHAGE) 1000 MG tablet, Take 1,000 mg by mouth 2 (two) times daily., Disp: , Rfl:    metoprolol succinate (TOPROL-XL) 50 MG 24 hr tablet, Take 50 mg by mouth daily., Disp: , Rfl:    polyethylene glycol-electrolytes (NULYTELY) 420 g solution, Take 4,000 mLs by mouth once., Disp: , Rfl:    sildenafil (VIAGRA) 50 MG tablet, Take 50 mg by mouth daily as needed., Disp: , Rfl:    sitaGLIPtin (JANUVIA) 100 MG tablet, Take 100 mg by mouth daily., Disp: ,  Rfl:  No current facility-administered medications for this visit.  Facility-Administered Medications Ordered in Other Visits:    acetaminophen  (TYLENOL ) 325 MG tablet, , , ,    loratadine  (CLARITIN ) 10 MG tablet, , , ,    Allergies: Allergies  Allergen Reactions   Ibuprofen Hives   Sulfa Antibiotics Rash    REVIEW OF SYSTEMS:   Review of Systems  Constitutional:  Negative for chills, fatigue and fever.  HENT:   Negative for lump/mass, mouth sores, nosebleeds, sore throat and trouble swallowing.   Eyes:  Negative for eye problems.  Respiratory:  Negative for cough and shortness of breath.   Cardiovascular:  Negative for chest pain, leg swelling and palpitations.  Gastrointestinal:  Negative for abdominal pain, constipation, diarrhea, nausea and vomiting.  Genitourinary:  Negative for bladder incontinence, difficulty urinating, dysuria, frequency, hematuria and nocturia.   Musculoskeletal:  Negative for arthralgias, back pain, flank pain, myalgias and neck pain.  Skin:  Negative for itching and rash.  Neurological:  Negative for dizziness, headaches and numbness.  Hematological:  Does not bruise/bleed easily.  Psychiatric/Behavioral:  Negative for depression, sleep disturbance and suicidal ideas.  The patient is not nervous/anxious.   All other systems reviewed and are negative.    VITALS:   There were no vitals taken for this visit.  Wt Readings from Last 3 Encounters:  11/08/23 241 lb 10 oz (109.6 kg)  10/19/23 240 lb 11.9 oz (109.2 kg)  09/28/23 238 lb 15.7 oz (108.4 kg)    There is no height or weight on file to calculate BMI.  Performance status (ECOG): 1 - Symptomatic but completely ambulatory  PHYSICAL EXAM:   Physical Exam Vitals and nursing note reviewed. Exam conducted with a chaperone present.  Constitutional:      Appearance: Normal appearance.  Cardiovascular:     Rate and Rhythm: Normal rate and regular rhythm.     Pulses: Normal pulses.     Heart sounds: Normal heart sounds.  Pulmonary:     Effort: Pulmonary effort is normal.     Breath sounds: Normal breath sounds.  Abdominal:     Palpations: Abdomen is soft. There is no hepatomegaly, splenomegaly or mass.     Tenderness: There is no abdominal tenderness.  Musculoskeletal:     Right lower leg: No edema.     Left lower leg: No edema.  Lymphadenopathy:     Cervical: No cervical adenopathy.     Right cervical: No superficial, deep or posterior cervical adenopathy.    Left cervical: No superficial, deep or posterior cervical adenopathy.     Upper Body:     Right upper body: No supraclavicular or axillary adenopathy.     Left upper body: No supraclavicular or axillary adenopathy.  Neurological:     General: No focal deficit present.     Mental Status: He is alert and oriented to person, place, and time.  Psychiatric:        Mood and Affect: Mood normal.        Behavior: Behavior normal.     LABS:   CBC     Component Value Date/Time   WBC 6.5 11/30/2023 0859   RBC 3.00 (L) 11/30/2023 0859   HGB 9.3 (L) 11/30/2023 0859   HCT 28.3 (L) 11/30/2023 0859   PLT 591 (H) 11/30/2023 0859   MCV 94.3 11/30/2023 0859   MCH 31.0 11/30/2023 0859   MCHC 32.9 11/30/2023 0859   RDW 25.7 (H) 11/30/2023  0859   LYMPHSABS 2.0  11/30/2023 0859   MONOABS 1.1 (H) 11/30/2023 0859   EOSABS 0.1 11/30/2023 0859   BASOSABS 0.1 11/30/2023 0859    CMP      Component Value Date/Time   NA 134 (L) 11/08/2023 0852   K 3.7 11/08/2023 0852   CL 98 11/08/2023 0852   CO2 27 11/08/2023 0852   GLUCOSE 274 (H) 11/08/2023 0852   BUN 19 11/08/2023 0852   CREATININE 1.09 11/08/2023 0852   CALCIUM 9.5 11/08/2023 0852   PROT 8.0 11/08/2023 0852   ALBUMIN 4.0 11/08/2023 0852   AST 18 11/08/2023 0852   ALT 24 11/08/2023 0852   ALKPHOS 89 11/08/2023 0852   BILITOT 1.7 (H) 11/08/2023 0852   GFRNONAA >60 11/08/2023 0852     No results found for: "CEA1", "CEA" / No results found for: "CEA1", "CEA" No results found for: "PSA1" No results found for: "VWU981" No results found for: "CAN125"  Lab Results  Component Value Date   TOTALPROTELP 8.2 09/11/2021   ALBUMINELP 4.6 (H) 09/11/2021   A1GS 0.2 09/11/2021   A2GS 0.5 09/11/2021   BETS 1.0 09/11/2021   GAMS 1.9 (H) 09/11/2021   MSPIKE Not Observed 09/11/2021   SPEI Comment 09/11/2021   Lab Results  Component Value Date   TIBC 240 (L) 11/08/2023   TIBC 198 (L) 09/28/2023   TIBC 202 (L) 09/07/2023   FERRITIN 1,275 (H) 11/08/2023   FERRITIN 1,663 (H) 09/28/2023   FERRITIN 2,113 (H) 09/07/2023   IRONPCTSAT 102 (H) 11/08/2023   IRONPCTSAT 88 (H) 09/28/2023   IRONPCTSAT 83 (H) 09/07/2023   Lab Results  Component Value Date   LDH 142 06/08/2022   LDH 174 04/05/2022   LDH 160 09/11/2021     STUDIES:   No results found.

## 2023-12-20 ENCOUNTER — Inpatient Hospital Stay

## 2023-12-20 ENCOUNTER — Inpatient Hospital Stay: Admitting: Hematology

## 2023-12-21 ENCOUNTER — Ambulatory Visit: Admitting: Hematology

## 2023-12-21 ENCOUNTER — Ambulatory Visit

## 2023-12-21 ENCOUNTER — Other Ambulatory Visit: Payer: Self-pay

## 2023-12-21 ENCOUNTER — Inpatient Hospital Stay

## 2023-12-22 ENCOUNTER — Inpatient Hospital Stay: Attending: Hematology

## 2023-12-22 ENCOUNTER — Inpatient Hospital Stay

## 2023-12-22 ENCOUNTER — Inpatient Hospital Stay (HOSPITAL_BASED_OUTPATIENT_CLINIC_OR_DEPARTMENT_OTHER): Admitting: Hematology

## 2023-12-22 VITALS — BP 123/62 | HR 80 | Temp 98.0°F | Resp 18 | Wt 237.7 lb

## 2023-12-22 DIAGNOSIS — I10 Essential (primary) hypertension: Secondary | ICD-10-CM | POA: Diagnosis not present

## 2023-12-22 DIAGNOSIS — Z8249 Family history of ischemic heart disease and other diseases of the circulatory system: Secondary | ICD-10-CM | POA: Diagnosis not present

## 2023-12-22 DIAGNOSIS — R7989 Other specified abnormal findings of blood chemistry: Secondary | ICD-10-CM | POA: Diagnosis not present

## 2023-12-22 DIAGNOSIS — D461 Refractory anemia with ring sideroblasts: Secondary | ICD-10-CM | POA: Insufficient documentation

## 2023-12-22 DIAGNOSIS — R5383 Other fatigue: Secondary | ICD-10-CM | POA: Insufficient documentation

## 2023-12-22 DIAGNOSIS — D469 Myelodysplastic syndrome, unspecified: Secondary | ICD-10-CM

## 2023-12-22 DIAGNOSIS — Z803 Family history of malignant neoplasm of breast: Secondary | ICD-10-CM | POA: Insufficient documentation

## 2023-12-22 DIAGNOSIS — E119 Type 2 diabetes mellitus without complications: Secondary | ICD-10-CM | POA: Insufficient documentation

## 2023-12-22 DIAGNOSIS — Z800 Family history of malignant neoplasm of digestive organs: Secondary | ICD-10-CM | POA: Insufficient documentation

## 2023-12-22 DIAGNOSIS — D75839 Thrombocytosis, unspecified: Secondary | ICD-10-CM | POA: Diagnosis present

## 2023-12-22 DIAGNOSIS — Z886 Allergy status to analgesic agent status: Secondary | ICD-10-CM | POA: Diagnosis not present

## 2023-12-22 DIAGNOSIS — Z833 Family history of diabetes mellitus: Secondary | ICD-10-CM | POA: Diagnosis not present

## 2023-12-22 DIAGNOSIS — Z79899 Other long term (current) drug therapy: Secondary | ICD-10-CM | POA: Diagnosis not present

## 2023-12-22 DIAGNOSIS — Z882 Allergy status to sulfonamides status: Secondary | ICD-10-CM | POA: Insufficient documentation

## 2023-12-22 DIAGNOSIS — Z9049 Acquired absence of other specified parts of digestive tract: Secondary | ICD-10-CM | POA: Insufficient documentation

## 2023-12-22 LAB — IRON AND TIBC
Iron: 250 ug/dL — ABNORMAL HIGH (ref 45–182)
Saturation Ratios: 106 % — ABNORMAL HIGH (ref 17.9–39.5)
TIBC: 236 ug/dL — ABNORMAL LOW (ref 250–450)

## 2023-12-22 LAB — CBC WITH DIFFERENTIAL/PLATELET
Abs Immature Granulocytes: 0.03 10*3/uL (ref 0.00–0.07)
Basophils Absolute: 0.1 10*3/uL (ref 0.0–0.1)
Basophils Relative: 1 %
Eosinophils Absolute: 0.2 10*3/uL (ref 0.0–0.5)
Eosinophils Relative: 3 %
HCT: 29.5 % — ABNORMAL LOW (ref 39.0–52.0)
Hemoglobin: 10 g/dL — ABNORMAL LOW (ref 13.0–17.0)
Immature Granulocytes: 1 %
Lymphocytes Relative: 40 %
Lymphs Abs: 2.6 10*3/uL (ref 0.7–4.0)
MCH: 32.3 pg (ref 26.0–34.0)
MCHC: 33.9 g/dL (ref 30.0–36.0)
MCV: 95.2 fL (ref 80.0–100.0)
Monocytes Absolute: 0.9 10*3/uL (ref 0.1–1.0)
Monocytes Relative: 14 %
Neutro Abs: 2.5 10*3/uL (ref 1.7–7.7)
Neutrophils Relative %: 41 %
Platelets: 521 10*3/uL — ABNORMAL HIGH (ref 150–400)
RBC: 3.1 MIL/uL — ABNORMAL LOW (ref 4.22–5.81)
RDW: 28.8 % — ABNORMAL HIGH (ref 11.5–15.5)
WBC: 6.2 10*3/uL (ref 4.0–10.5)
nRBC: 0.5 % — ABNORMAL HIGH (ref 0.0–0.2)

## 2023-12-22 LAB — COMPREHENSIVE METABOLIC PANEL WITH GFR
ALT: 28 U/L (ref 0–44)
AST: 27 U/L (ref 15–41)
Albumin: 4.3 g/dL (ref 3.5–5.0)
Alkaline Phosphatase: 57 U/L (ref 38–126)
Anion gap: 11 (ref 5–15)
BUN: 24 mg/dL — ABNORMAL HIGH (ref 8–23)
CO2: 26 mmol/L (ref 22–32)
Calcium: 9.6 mg/dL (ref 8.9–10.3)
Chloride: 99 mmol/L (ref 98–111)
Creatinine, Ser: 1.24 mg/dL (ref 0.61–1.24)
GFR, Estimated: >60 mL/min (ref >60)
Glucose, Bld: 182 mg/dL — ABNORMAL HIGH (ref 70–99)
Potassium: 3.7 mmol/L (ref 3.5–5.1)
Sodium: 136 mmol/L (ref 135–145)
Total Bilirubin: 2 mg/dL — ABNORMAL HIGH (ref 0.0–1.2)
Total Protein: 8.2 g/dL — ABNORMAL HIGH (ref 6.5–8.1)

## 2023-12-22 LAB — FERRITIN: Ferritin: 1026 ng/mL — ABNORMAL HIGH (ref 24–336)

## 2023-12-22 MED ORDER — LUSPATERCEPT-AAMT 75 MG ~~LOC~~ SOLR
100.0000 mg | Freq: Once | SUBCUTANEOUS | Status: AC
  Administered 2023-12-22: 100 mg via SUBCUTANEOUS
  Filled 2023-12-22: qty 1.5

## 2023-12-22 NOTE — Patient Instructions (Signed)
 [facility] [place] - A DEPT OF [facility]  Discharge Instructions: Thank you for choosing [facility] to provide your oncology and hematology care.  If you have a lab appointment with the Cancer Center - please note that after April 8th, 2024, all labs will be drawn in the cancer center.  You do not have to check in or register with the main entrance as you have in the past but will complete your check-in in the cancer center.  Wear comfortable clothing and clothing appropriate for easy access to any Portacath or PICC line.   We strive to give you quality time with your provider. You may need to reschedule your appointment if you arrive late (15 or more minutes).  Arriving late affects you and other patients whose appointments are after yours.  Also, if you miss three or more appointments without notifying the office, you may be dismissed from the clinic at the provider's discretion.      For prescription refill requests, have your pharmacy contact our office and allow 72 hours for refills to be completed.    Today you received the following chemotherapy and/or immunotherapy agents Reblozyl.  Luspatercept Injection What is this medication? LUSPATERCEPT (lus PAT er sept) treats low levels of red blood cells (anemia) in the body in people with beta thalassemia or myelodysplastic syndromes. It works by helping the body make more red blood cells. This medicine may be used for other purposes; ask your health care provider or pharmacist if you have questions. COMMON BRAND NAME(S): REBLOZYL What should I tell my care team before I take this medication? They need to know if you have any of these conditions: Have had your spleen removed High blood pressure History of blood clots Tobacco use An unusual or allergic reaction to luspatercept, other medications, foods, dyes, or preservatives Pregnant or trying to get pregnant Breastfeeding How should I use this  medication? This medication is injected under the skin. It is given by your care team in a hospital or clinic setting. Talk to your care team about the use of the medication in children. It is not approved for use in children. Overdosage: If you think you have taken too much of this medicine contact a poison control center or emergency room at once. NOTE: This medicine is only for you. Do not share this medicine with others. What if I miss a dose? Keep appointments for follow-up doses. It is important not to miss your dose. Call your care team if you are unable to keep an appointment. What may interact with this medication? Interactions are not expected. This list may not describe all possible interactions. Give your health care provider a list of all the medicines, herbs, non-prescription drugs, or dietary supplements you use. Also tell them if you smoke, drink alcohol, or use illegal drugs. Some items may interact with your medicine. What should I watch for while using this medication? Your condition will be monitored carefully while you are receiving this medication. You may need blood work done while you are taking this medication. Talk to your care team if you may be pregnant. Serious birth defects can occur if you take this medication during pregnancy. Contraception is recommended while taking this medication. Your care team can help you find the option that works for you. Talk to your care team before breastfeeding. Changes to your treatment plan may be needed. What side effects may I notice from receiving this medication? Side  effects that you should report to your care team as soon as possible: Allergic reactions--skin rash, itching, hives, swelling of the face, lips, tongue, or throat Blood clot--pain, swelling, or warmth in the leg, shortness of breath, chest pain Increase in blood pressure Severe back pain, numbness or weakness of the hands, arms, legs, or feet, loss of coordination,  loss of bowel or bladder control Side effects that usually do not require medical attention (report these to your care team if they continue or are bothersome): Bone pain Dizziness Fatigue Headache Joint pain Muscle pain Stomach pain This list may not describe all possible side effects. Call your doctor for medical advice about side effects. You may report side effects to FDA at 1-800-FDA-1088. Where should I keep my medication? This medication is given in a hospital or clinic. It will not be stored at home. NOTE: This sheet is a summary. It may not cover all possible information. If you have questions about this medicine, talk to your doctor, pharmacist, or health care provider.  2024 Elsevier/Gold Standard (2022-12-24 00:00:00)       To help prevent nausea and vomiting after your treatment, we encourage you to take your nausea medication as directed.  BELOW ARE SYMPTOMS THAT SHOULD BE REPORTED IMMEDIATELY: *FEVER GREATER THAN 100.4 F (38 C) OR HIGHER *CHILLS OR SWEATING *NAUSEA AND VOMITING THAT IS NOT CONTROLLED WITH YOUR NAUSEA MEDICATION *UNUSUAL SHORTNESS OF BREATH *UNUSUAL BRUISING OR BLEEDING *URINARY PROBLEMS (pain or burning when urinating, or frequent urination) *BOWEL PROBLEMS (unusual diarrhea, constipation, pain near the anus) TENDERNESS IN MOUTH AND THROAT WITH OR WITHOUT PRESENCE OF ULCERS (sore throat, sores in mouth, or a toothache) UNUSUAL RASH, SWELLING OR PAIN  UNUSUAL VAGINAL DISCHARGE OR ITCHING   Items with * indicate a potential emergency and should be followed up as soon as possible or go to the Emergency Department if any problems should occur.  Please show the CHEMOTHERAPY ALERT CARD or IMMUNOTHERAPY ALERT CARD at check-in to the Emergency Department and triage nurse.  Should you have questions after your visit or need to cancel or reschedule your appointment, please contact [facility] [place] - A DEPT OF [facility]  [phone]  and follow the prompts.  Office hours are 8:00 a.m. to 4:30 p.m. Monday - Friday. Please note that voicemails left after 4:00 p.m. may not be returned until the following business day.  We are closed weekends and major holidays. You have access to a nurse at all times for urgent questions. Please call the main number to the clinic [phone] and follow the prompts.  For any non-urgent questions, you may also contact your provider using MyChart. We now offer e-Visits for anyone 15 and older to request care online for non-urgent symptoms. For details visit [email or web address].   Also download the MyChart app! Go to the app store, search "MyChart", open the app, select [place], and log in with your MyChart username and password.

## 2023-12-22 NOTE — Progress Notes (Signed)
 [facility] [address], [place] [address]    Clinic Day:  12/22/2023  Referring physician: Alanna Hu, PA  Patient Care Team: Alanna Hu, [place] as PCP - General (Family Medicine) Paulett Boros, MD as Medical Oncologist (Hematology)   ASSESSMENT & PLAN:   Assessment: 1. MDS with ringed sideroblasts and thrombocytosis: - Patient seen for normocytic anemia with CBC on 05/08/2021 showing hemoglobin 8.4, MCV 97 and platelet count 631. - Ferritin was 1454, percent saturation was 85.  Hemochromatosis was negative. - He is taking iron tablet 2 times daily for the last 1 year. - Colonoscopy was reportedly done in [place] 3 years ago, within normal limits. - BMBX (10/08/2021): Hypercellular marrow with erythroid hyperplasia, erythroid dysplasia and ring sideroblasts.  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 - IPSS M: Score is -0.73 (low risk).  IPSS-R score 2.5.  Median leukemia free survival was 5.9%.  Median overall survival was 6 years.  AML transformation rate was 1.7 %/year. - Ring sideroblasts were more than 15%.  JAK2 V617F and reflex testing and BCR/ABL by FISH were negative for thrombocytosis. - Serum EPO was 107. - Retacrit  30,000 units weekly started on 11/26/2021, dose changed to 40,000 units every 2 weeks on 12/31/2021, dose increased to 60,000 units weekly on 03/01/2022.  Last dose of Retacrit  on 06/22/2022.  Luspatercept  1 mg/kg started on 06/15/2022.   2. 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 - Denies any smoking history. - Paternal aunt had pancreatic cancer.  Sister had breast cancer.   3.  Elevated ferritin levels: - Testing negative for C282Y and H63D - Expanded [employer] testing negative for 6 genes (FTH1, HAMP,HFE,HjV,SLC40A1,TFR2) - MRI liver  (08/30/2022): Severe hepatic steatosis with marked hepatic iron deposition.  Calculated hepatic iron content is 17.1 mg/g.    Plan: 1. MDS with ring sideroblasts and thrombocytosis: - He is receiving luspatercept  1 mg/kg every 3 weeks.  He could not get the injection on 11/08/2023 as his hemoglobin increased to 12.4.  In the following 3 weeks, it has dropped down to 9.3.  Today it is 10.0. - Recommend increasing luspatercept  to 100 mg from 75 mg previously.  RTC 6 weeks for follow-up.  2.  Elevated ferritin (hemochromatosis negative): - Jadenu  360 mg, 4 tablets daily started on 10/13/2023. - He is tolerating it very well.  No GI side effects.  However bilirubin has increased to 2.0.  Rest of LFTs normal.  Will closely monitor. - Ferritin today improved to 1026 from 1275 on 11/08/2023. - Continue Jadenu  360 mg, 4 tablets daily.  Will plan on repeating ferritin and iron panel in 6 weeks.  3.  Hypertension: - Continue lisinopril, HCTZ and Norvasc.  Blood pressure today is 123/62.   4.  Fatigue: - Overall this has improved.  However he feels tired today as his hemoglobin is low at 10.  Previous testing with TSH and testosterone  were normal.    No orders of the defined types were placed in this encounter.    Nadeen Augusta Teague,acting as a Neurosurgeon for Paulett Boros, MD.,have documented all relevant documentation on the behalf of Paulett Boros, MD,as directed by  Paulett Boros, MD while in the presence of Paulett Boros, MD.  I, Paulett Boros MD, have reviewed the above documentation for accuracy and completeness, and I  agree with the above.     Paulett Boros, MD   5/15/20251:42 PM  CHIEF COMPLAINT:   Diagnosis: MDS    Cancer Staging  No matching staging information was found for the patient.    Prior Therapy: Retacrit  60,000 units weekly, 11/26/21 - 06/22/22   Current Therapy:  Luspatercept  every 3 weeks    HISTORY OF PRESENT ILLNESS:   Oncology  History  MDS (myelodysplastic syndrome) (HCC)  11/14/2021 Initial Diagnosis   MDS (myelodysplastic syndrome) (HCC)   06/15/2022 -  Chemotherapy   Patient is on Treatment Plan : MYELODYSPLASIA Luspatercept  q21d        INTERVAL HISTORY:   Stephen Watts is a 63 y.o. male presenting to clinic today for follow up of MDS. He was last seen by me on 11/08/23.  Today, he states that he is doing well overall. His appetite level is at 100%. His energy level is at 75%.  PAST MEDICAL HISTORY:   Past Medical History: Past Medical History:  Diagnosis Date   Anemia    Diabetes (HCC)    Hypertension     Surgical History: Past Surgical History:  Procedure Laterality Date   APPENDECTOMY     age 68 years old    Social History: Social History   Socioeconomic History   Marital status: Widowed    Spouse name: Not on file   Number of children: Not on file   Years of education: Not on file   Highest education level: Not on file  Occupational History   Not on file  Tobacco Use   Smoking status: Never    Passive exposure: Never   Smokeless tobacco: Never  Substance and Sexual Activity   Alcohol use: Never   Drug use: Never   Sexual activity: Not on file  Other Topics Concern   Not on file  Social History Narrative   Not on file   Social Drivers of Health   Financial Resource Strain: Not on file  Food Insecurity: Not on file  Transportation Needs: Not on file  Physical Activity: Not on file  Stress: Not on file  Social Connections: Not on file  Intimate Partner Violence: Not on file    Family History: Family History  Problem Relation Age of Onset   Heart disease Mother    Diabetes Sister     Current Medications:  Current Outpatient Medications:    amLODipine (NORVASC) 10 MG tablet, Take 10 mg by mouth daily., Disp: , Rfl:    Deferasirox  (JADENU ) 360 MG TABS, Take 4 tablets (1,440 mg total) by mouth daily. Take on empty stomach., Disp: 120 tablet, Rfl: 4   ferrous sulfate 325  (65 FE) MG tablet, Take 325 mg by mouth 2 (two) times a week., Disp: , Rfl:    glimepiride (AMARYL) 2 MG tablet, Take 2 mg by mouth daily with breakfast., Disp: , Rfl:    hydrochlorothiazide (HYDRODIURIL) 25 MG tablet, Take 25 mg by mouth daily., Disp: , Rfl:    levothyroxine (SYNTHROID) 125 MCG tablet, TAKE 1 TABLET BY MOUTH MONDAY THRU SATURDAY, Disp: , Rfl:    lisinopril (ZESTRIL) 40 MG tablet, Take 40 mg by mouth daily., Disp: , Rfl:    metFORMIN (GLUCOPHAGE) 1000 MG tablet, Take 1,000 mg by mouth 2 (two) times daily., Disp: , Rfl:    metoprolol succinate (TOPROL-XL) 50 MG 24 hr tablet, Take 50 mg by mouth daily., Disp: , Rfl:    polyethylene glycol-electrolytes (NULYTELY) 420 g solution, Take 4,000 mLs by mouth once.,  Disp: , Rfl:    sildenafil (VIAGRA) 50 MG tablet, Take 50 mg by mouth daily as needed., Disp: , Rfl:    sitaGLIPtin (JANUVIA) 100 MG tablet, Take 100 mg by mouth daily., Disp: , Rfl:  No current facility-administered medications for this visit.  Facility-Administered Medications Ordered in Other Visits:    acetaminophen  (TYLENOL ) 325 MG tablet, , , ,    loratadine  (CLARITIN ) 10 MG tablet, , , ,    luspatercept -aamt (REBLOZYL ) subcutaneous injection 100 mg, 100 mg, Subcutaneous, Once, Stephen Larouche, MD   Allergies: Allergies  Allergen Reactions   Ibuprofen Hives   Sulfa Antibiotics Rash    REVIEW OF SYSTEMS:   Review of Systems  Constitutional:  Negative for chills, fatigue and fever.  HENT:   Negative for lump/mass, mouth sores, nosebleeds, sore throat and trouble swallowing.   Eyes:  Negative for eye problems.  Respiratory:  Positive for shortness of breath. Negative for cough.   Cardiovascular:  Negative for chest pain, leg swelling and palpitations.  Gastrointestinal:  Negative for abdominal pain, constipation, diarrhea, nausea and vomiting.  Genitourinary:  Negative for bladder incontinence, difficulty urinating, dysuria, frequency, hematuria and  nocturia.   Musculoskeletal:  Negative for arthralgias, back pain, flank pain, myalgias and neck pain.  Skin:  Negative for itching and rash.  Neurological:  Negative for dizziness, headaches and numbness.  Hematological:  Does not bruise/bleed easily.  Psychiatric/Behavioral:  Negative for depression, sleep disturbance and suicidal ideas. The patient is not nervous/anxious.   All other systems reviewed and are negative.    VITALS:   Blood pressure 123/62, pulse 80, temperature 98 F (36.7 C), temperature source Oral, resp. rate 18, weight 237 lb 10.5 oz (107.8 kg), SpO2 99%.  Wt Readings from Last 3 Encounters:  12/22/23 237 lb 10.5 oz (107.8 kg)  11/08/23 241 lb 10 oz (109.6 kg)  10/19/23 240 lb 11.9 oz (109.2 kg)    Body mass index is 37.22 kg/m.  Performance status (ECOG): 1 - Symptomatic but completely ambulatory  PHYSICAL EXAM:   Physical Exam Vitals and nursing note reviewed. Exam conducted with a chaperone present.  Constitutional:      Appearance: Normal appearance.  Cardiovascular:     Rate and Rhythm: Normal rate and regular rhythm.     Pulses: Normal pulses.     Heart sounds: Normal heart sounds.  Pulmonary:     Effort: Pulmonary effort is normal.     Breath sounds: Normal breath sounds.  Abdominal:     Palpations: Abdomen is soft. There is no hepatomegaly, splenomegaly or mass.     Tenderness: There is no abdominal tenderness.  Musculoskeletal:     Right lower leg: No edema.     Left lower leg: No edema.  Lymphadenopathy:     Cervical: No cervical adenopathy.     Right cervical: No superficial, deep or posterior cervical adenopathy.    Left cervical: No superficial, deep or posterior cervical adenopathy.     Upper Body:     Right upper body: No supraclavicular or axillary adenopathy.     Left upper body: No supraclavicular or axillary adenopathy.  Neurological:     General: No focal deficit present.     Mental Status: He is alert and oriented to person,  place, and time.  Psychiatric:        Mood and Affect: Mood normal.        Behavior: Behavior normal.     LABS:   CBC  Component Value Date/Time   WBC 6.2 12/22/2023 1135   RBC 3.10 (L) 12/22/2023 1135   HGB 10.0 (L) 12/22/2023 1135   HCT 29.5 (L) 12/22/2023 1135   PLT 521 (H) 12/22/2023 1135   MCV 95.2 12/22/2023 1135   MCH 32.3 12/22/2023 1135   MCHC 33.9 12/22/2023 1135   RDW 28.8 (H) 12/22/2023 1135   LYMPHSABS 2.6 12/22/2023 1135   MONOABS 0.9 12/22/2023 1135   EOSABS 0.2 12/22/2023 1135   BASOSABS 0.1 12/22/2023 1135    CMP      Component Value Date/Time   NA PENDING 12/22/2023 1135   K PENDING 12/22/2023 1135   CL PENDING 12/22/2023 1135   CO2 PENDING 12/22/2023 1135   GLUCOSE 182 (H) 12/22/2023 1135   BUN 24 (H) 12/22/2023 1135   CREATININE 1.24 12/22/2023 1135   CALCIUM PENDING 12/22/2023 1135   PROT 8.2 (H) 12/22/2023 1135   ALBUMIN 4.3 12/22/2023 1135   AST 27 12/22/2023 1135   ALT 28 12/22/2023 1135   ALKPHOS 57 12/22/2023 1135   BILITOT 2.0 (H) 12/22/2023 1135   GFRNONAA >60 12/22/2023 1135     No results found for: "CEA1", "CEA" / No results found for: "CEA1", "CEA" No results found for: "PSA1" No results found for: "ZOX096" No results found for: "CAN125"  Lab Results  Component Value Date   TOTALPROTELP 8.2 09/11/2021   ALBUMINELP 4.6 (H) 09/11/2021   A1GS 0.2 09/11/2021   A2GS 0.5 09/11/2021   BETS 1.0 09/11/2021   GAMS 1.9 (H) 09/11/2021   MSPIKE Not Observed 09/11/2021   SPEI Comment 09/11/2021   Lab Results  Component Value Date   TIBC 236 (L) 12/22/2023   TIBC 240 (L) 11/08/2023   TIBC 198 (L) 09/28/2023   FERRITIN 1,026 (H) 12/22/2023   FERRITIN 1,275 (H) 11/08/2023   FERRITIN 1,663 (H) 09/28/2023   IRONPCTSAT 106 (H) 12/22/2023   IRONPCTSAT 102 (H) 11/08/2023   IRONPCTSAT 88 (H) 09/28/2023   Lab Results  Component Value Date   LDH 142 06/08/2022   LDH 174 04/05/2022   LDH 160 09/11/2021     STUDIES:   No  results found.

## 2023-12-22 NOTE — Patient Instructions (Signed)
 [facility] at [facility] Discharge Instructions   You were seen and examined today by Dr. Cheree Cords.  He reviewed the results of your lab work which are normal/stable.   We will proceed with your Reblozyl  injection today. Dr. Linnell Richardson increased the dose to 100 mg.   Return as scheduled.    Thank you for choosing [facility] at [facility] to provide your oncology and hematology care.  To afford each patient quality time with our provider, please arrive at least 15 minutes before your scheduled appointment time.   If you have a lab appointment with the Cancer Center please come in thru the Main Entrance and check in at the main information desk.  You need to re-schedule your appointment should you arrive 10 or more minutes late.  We strive to give you quality time with our providers, and arriving late affects you and other patients whose appointments are after yours.  Also, if you no show three or more times for appointments you may be dismissed from the clinic at the providers discretion.     Again, thank you for choosing [facility].  Our hope is that these requests will decrease the amount of time that you wait before being seen by our physicians.       _____________________________________________________________  Should you have questions after your visit to [facility], please contact our office at [phone] and follow the prompts.  Our office hours are 8:00 a.m. and 4:30 p.m. Monday - Friday.  Please note that voicemails left after 4:00 p.m. may not be returned until the following business day.  We are closed weekends and major holidays.  You do have access to a nurse 24-7, just call the main number to the clinic [phone] and do not press any options, hold on the line and a nurse will answer the phone.    For prescription refill requests, have your pharmacy contact our office and allow 72 hours.    Due to Covid,  you will need to wear a mask upon entering the hospital. If you do not have a mask, a mask will be given to you at the Main Entrance upon arrival. For doctor visits, patients may have 1 support person age 39 or older with them. For treatment visits, patients can not have anyone with them due to social distancing guidelines and our immunocompromised population.

## 2023-12-22 NOTE — Progress Notes (Signed)
 Patient presents today for Reblozyl  injection and follow up appointment with Dr. Katragadda. Hgb 10.0 . Vital signs within normal limits.  Stephen Watts presents today for injection per the provider's orders.  Reblozyl  administration without incident; injection site WNL; see MAR for injection details.  Patient tolerated procedure well and without incident.  No questions or complaints noted at this time. Discharged from clinic ambulatory in stable condition. Alert and oriented x 3. F/U with [facility] as scheduled.

## 2023-12-23 ENCOUNTER — Other Ambulatory Visit: Payer: Self-pay

## 2024-01-03 ENCOUNTER — Other Ambulatory Visit: Payer: Self-pay

## 2024-01-12 ENCOUNTER — Inpatient Hospital Stay

## 2024-01-12 ENCOUNTER — Inpatient Hospital Stay: Attending: Hematology

## 2024-01-12 DIAGNOSIS — Z886 Allergy status to analgesic agent status: Secondary | ICD-10-CM | POA: Insufficient documentation

## 2024-01-12 DIAGNOSIS — Z803 Family history of malignant neoplasm of breast: Secondary | ICD-10-CM | POA: Diagnosis not present

## 2024-01-12 DIAGNOSIS — Z882 Allergy status to sulfonamides status: Secondary | ICD-10-CM | POA: Diagnosis not present

## 2024-01-12 DIAGNOSIS — R7989 Other specified abnormal findings of blood chemistry: Secondary | ICD-10-CM | POA: Insufficient documentation

## 2024-01-12 DIAGNOSIS — Z79899 Other long term (current) drug therapy: Secondary | ICD-10-CM | POA: Insufficient documentation

## 2024-01-12 DIAGNOSIS — R5383 Other fatigue: Secondary | ICD-10-CM | POA: Insufficient documentation

## 2024-01-12 DIAGNOSIS — Z833 Family history of diabetes mellitus: Secondary | ICD-10-CM | POA: Diagnosis not present

## 2024-01-12 DIAGNOSIS — I10 Essential (primary) hypertension: Secondary | ICD-10-CM | POA: Insufficient documentation

## 2024-01-12 DIAGNOSIS — D75839 Thrombocytosis, unspecified: Secondary | ICD-10-CM | POA: Diagnosis present

## 2024-01-12 DIAGNOSIS — Z8249 Family history of ischemic heart disease and other diseases of the circulatory system: Secondary | ICD-10-CM | POA: Diagnosis not present

## 2024-01-12 DIAGNOSIS — Z9049 Acquired absence of other specified parts of digestive tract: Secondary | ICD-10-CM | POA: Diagnosis not present

## 2024-01-12 DIAGNOSIS — Z800 Family history of malignant neoplasm of digestive organs: Secondary | ICD-10-CM | POA: Diagnosis not present

## 2024-01-12 DIAGNOSIS — D461 Refractory anemia with ring sideroblasts: Secondary | ICD-10-CM | POA: Diagnosis present

## 2024-01-12 DIAGNOSIS — D469 Myelodysplastic syndrome, unspecified: Secondary | ICD-10-CM

## 2024-01-12 LAB — CBC WITH DIFFERENTIAL/PLATELET
Abs Immature Granulocytes: 0.01 10*3/uL (ref 0.00–0.07)
Basophils Absolute: 0.1 10*3/uL (ref 0.0–0.1)
Basophils Relative: 1 %
Eosinophils Absolute: 0.2 10*3/uL (ref 0.0–0.5)
Eosinophils Relative: 3 %
HCT: 35.9 % — ABNORMAL LOW (ref 39.0–52.0)
Hemoglobin: 11.9 g/dL — ABNORMAL LOW (ref 13.0–17.0)
Immature Granulocytes: 0 %
Lymphocytes Relative: 43 %
Lymphs Abs: 3 10*3/uL (ref 0.7–4.0)
MCH: 33.2 pg (ref 26.0–34.0)
MCHC: 33.1 g/dL (ref 30.0–36.0)
MCV: 100.3 fL — ABNORMAL HIGH (ref 80.0–100.0)
Monocytes Absolute: 0.9 10*3/uL (ref 0.1–1.0)
Monocytes Relative: 13 %
Neutro Abs: 2.7 10*3/uL (ref 1.7–7.7)
Neutrophils Relative %: 40 %
Platelets: 444 10*3/uL — ABNORMAL HIGH (ref 150–400)
RBC: 3.58 MIL/uL — ABNORMAL LOW (ref 4.22–5.81)
RDW: 30.5 % — ABNORMAL HIGH (ref 11.5–15.5)
WBC: 6.8 10*3/uL (ref 4.0–10.5)
nRBC: 0.6 % — ABNORMAL HIGH (ref 0.0–0.2)

## 2024-01-12 NOTE — Progress Notes (Signed)
 Labs reviewed with MD and treatment team. Pt.'s Hgb 11.9. Per MD to hold today's Reblozyl  dose and have pt come back in two weeks for labs and possible Reblozyl . Pt updated and all questions answered. New appointments given to patient. All follow ups as scheduled.   Stephen Watts

## 2024-01-16 ENCOUNTER — Encounter: Payer: Self-pay | Admitting: Hematology

## 2024-01-17 ENCOUNTER — Other Ambulatory Visit: Payer: Self-pay

## 2024-01-20 ENCOUNTER — Other Ambulatory Visit: Payer: Self-pay

## 2024-01-24 ENCOUNTER — Other Ambulatory Visit: Payer: Self-pay

## 2024-01-25 NOTE — Progress Notes (Signed)
 [facility] 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 - Ferritin was 1454, percent saturation was 85.  Hemochromatosis was negative. - He is taking iron tablet 2 times daily for the last 1 year. - Colonoscopy was reportedly done in [place] 3 years ago, within normal limits. - BMBX (10/08/2021): Hypercellular marrow with erythroid hyperplasia, erythroid dysplasia and ring sideroblasts.  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 - IPSS M: Score is -0.73 (low risk).  IPSS-R score 2.5.  Median leukemia free survival was 5.9%.  Median overall survival was 6 years.  AML transformation rate was 1.7 %/year. - Ring sideroblasts were more than 15%.  JAK2 V617F and reflex testing and BCR/ABL by FISH were negative for thrombocytosis. - Serum EPO was 107. - Retacrit  30,000 units weekly started on 11/26/2021, dose changed to 40,000 units every 2 weeks on 12/31/2021, dose increased to 60,000 units weekly on 03/01/2022.  Last dose of Retacrit  on 06/22/2022.  Luspatercept  1 mg/kg started on 06/15/2022.   2. 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 - Denies any smoking history. - Paternal aunt had pancreatic cancer.  Sister had breast cancer.   3.  Elevated ferritin levels: - Testing negative for C282Y and H63D - Expanded [employer] testing negative for 6 genes (FTH1, HAMP,HFE,HjV,SLC40A1,TFR2) - MRI liver  (08/30/2022): Severe hepatic steatosis with marked hepatic iron deposition.  Calculated hepatic iron content is 17.1 mg/g.    Plan: 1. MDS with ring sideroblasts and thrombocytosis: - Last dose of luspatercept  was increased to 100 mg from 75 mg on 12/22/2023.  His subsequent hemoglobin on 01/12/2024 increased to 11.9.  He did not receive it.  2 weeks later hemoglobin dropped down to 9.5.  Will proceed with luspatercept  100 mg today.  Will repeat CBC in 3 weeks.  If it overshoots again about 11.6, we will change luspatercept  100 mg to every 4 weeks.  RTC 6 weeks for follow-up.  2.  Elevated ferritin (hemochromatosis negative): - Jadenu  360 mg, 4 tablets daily started on 10/13/2023. - No GI side effects.  Bilirubin has increased to 2.0. - Ferritin has improved to 1026. - Continue Jadenu  at same dose.  Will repeat ferritin and iron panel at next visit.  3.  Hypertension: - Continue lisinopril, HCTZ and Norvasc.  Blood pressure is 144/77.   4.  Fatigue: - Overall improved since start of luspatercept .  However he feels fatigued when hemoglobin drops below 10.  Previous TSH and testosterone  were normal.    Orders Placed This Encounter  Procedures   CBC with Differential    Standing Status:   Future    Expected Date:   03/29/2024    Expiration Date:   03/30/2025     Nadeen Augusta Teague,acting as a scribe for Paulett Boros, MD.,have documented all relevant documentation on the behalf of Paulett Boros, MD,as directed by  Paulett Boros, MD while in the presence of Paulett Boros, MD.  I,  Paulett Boros MD, have reviewed the above documentation for accuracy and completeness, and I agree with the above.     Paulett Boros, MD   6/19/202510:10 AM  CHIEF COMPLAINT:   Diagnosis: MDS    Cancer Staging  No matching staging information was found for the patient.    Prior Therapy: Retacrit  60,000 units weekly, 11/26/21 - 06/22/22   Current Therapy:  Luspatercept   every 3 weeks    HISTORY OF PRESENT ILLNESS:   Oncology History  MDS (myelodysplastic syndrome) (HCC)  11/14/2021 Initial Diagnosis   MDS (myelodysplastic syndrome) (HCC)   06/15/2022 -  Chemotherapy   Patient is on Treatment Plan : MYELODYSPLASIA Luspatercept  q21d        INTERVAL HISTORY:   Jazmine is a 63 y.o. male presenting to clinic today for follow up of MDS. He was last seen by me on 12/22/23.  Today, he states that he is doing well overall. His appetite level is at 100%. His energy level is at 80%.  PAST MEDICAL HISTORY:   Past Medical History: Past Medical History:  Diagnosis Date   Anemia    Diabetes (HCC)    Hypertension     Surgical History: Past Surgical History:  Procedure Laterality Date   APPENDECTOMY     age 45 years old    Social History: Social History   Socioeconomic History   Marital status: Widowed    Spouse name: Not on file   Number of children: Not on file   Years of education: Not on file   Highest education level: Not on file  Occupational History   Not on file  Tobacco Use   Smoking status: Never    Passive exposure: Never   Smokeless tobacco: Never  Substance and Sexual Activity   Alcohol use: Never   Drug use: Never   Sexual activity: Not on file  Other Topics Concern   Not on file  Social History Narrative   Not on file   Social Drivers of Health   Financial Resource Strain: Not on file  Food Insecurity: Not on file  Transportation Needs: Not on file  Physical Activity: Not on file  Stress: Not on file  Social Connections: Not on file  Intimate Partner Violence: Not on file    Family History: Family History  Problem Relation Age of Onset   Heart disease Mother    Diabetes Sister     Current Medications:  Current Outpatient Medications:    amLODipine (NORVASC) 10 MG tablet, Take 10 mg by mouth daily., Disp: , Rfl:    Deferasirox  (JADENU ) 360 MG TABS, Take 4 tablets (1,440 mg total) by mouth daily. Take on  empty stomach., Disp: 120 tablet, Rfl: 4   ferrous sulfate 325 (65 FE) MG tablet, Take 325 mg by mouth 2 (two) times a week., Disp: , Rfl:    glimepiride (AMARYL) 2 MG tablet, Take 2 mg by mouth daily with breakfast., Disp: , Rfl:    hydrochlorothiazide (HYDRODIURIL) 25 MG tablet, Take 25 mg by mouth daily., Disp: , Rfl:    levothyroxine (SYNTHROID) 125 MCG tablet, TAKE 1 TABLET BY MOUTH MONDAY THRU SATURDAY, Disp: , Rfl:    lisinopril (ZESTRIL) 40 MG tablet, Take 40 mg by mouth daily., Disp: , Rfl:    metFORMIN (GLUCOPHAGE) 1000 MG tablet, Take 1,000 mg by mouth 2 (two) times daily., Disp: , Rfl:    metoprolol succinate (TOPROL-XL) 50 MG 24 hr tablet, Take 50 mg by mouth daily., Disp: , Rfl:  polyethylene glycol-electrolytes (NULYTELY) 420 g solution, Take 4,000 mLs by mouth once., Disp: , Rfl:    sildenafil (VIAGRA) 50 MG tablet, Take 50 mg by mouth daily as needed., Disp: , Rfl:    sitaGLIPtin (JANUVIA) 100 MG tablet, Take 100 mg by mouth daily., Disp: , Rfl:  No current facility-administered medications for this visit.  Facility-Administered Medications Ordered in Other Visits:    acetaminophen  (TYLENOL ) 325 MG tablet, , , ,    loratadine  (CLARITIN ) 10 MG tablet, , , ,    Allergies: Allergies  Allergen Reactions   Ibuprofen Hives   Sulfa Antibiotics Rash    REVIEW OF SYSTEMS:   Review of Systems  Constitutional:  Negative for chills, fatigue and fever.  HENT:   Negative for lump/mass, mouth sores, nosebleeds, sore throat and trouble swallowing.   Eyes:  Negative for eye problems.  Respiratory:  Negative for cough and shortness of breath.   Cardiovascular:  Negative for chest pain, leg swelling and palpitations.  Gastrointestinal:  Negative for abdominal pain, constipation, diarrhea, nausea and vomiting.  Genitourinary:  Negative for bladder incontinence, difficulty urinating, dysuria, frequency, hematuria and nocturia.   Musculoskeletal:  Negative for arthralgias, back pain,  flank pain, myalgias and neck pain.  Skin:  Negative for itching and rash.  Neurological:  Positive for numbness. Negative for dizziness and headaches.  Hematological:  Does not bruise/bleed easily.  Psychiatric/Behavioral:  Negative for depression, sleep disturbance and suicidal ideas. The patient is not nervous/anxious.   All other systems reviewed and are negative.    VITALS:   Blood pressure (!) 160/89, pulse 92, temperature 98.2 F (36.8 C), temperature source Oral, resp. rate 19, weight 233 lb 15.9 oz (106.1 kg), SpO2 100%.  Wt Readings from Last 3 Encounters:  01/26/24 233 lb 15.9 oz (106.1 kg)  12/22/23 237 lb 10.5 oz (107.8 kg)  11/08/23 241 lb 10 oz (109.6 kg)    Body mass index is 36.65 kg/m.  Performance status (ECOG): 1 - Symptomatic but completely ambulatory  PHYSICAL EXAM:   Physical Exam Vitals and nursing note reviewed. Exam conducted with a chaperone present.  Constitutional:      Appearance: Normal appearance.   Cardiovascular:     Rate and Rhythm: Normal rate and regular rhythm.     Pulses: Normal pulses.     Heart sounds: Normal heart sounds.  Pulmonary:     Effort: Pulmonary effort is normal.     Breath sounds: Normal breath sounds.  Abdominal:     Palpations: Abdomen is soft. There is no hepatomegaly, splenomegaly or mass.     Tenderness: There is no abdominal tenderness.   Musculoskeletal:     Right lower leg: No edema.     Left lower leg: No edema.  Lymphadenopathy:     Cervical: No cervical adenopathy.     Right cervical: No superficial, deep or posterior cervical adenopathy.    Left cervical: No superficial, deep or posterior cervical adenopathy.     Upper Body:     Right upper body: No supraclavicular or axillary adenopathy.     Left upper body: No supraclavicular or axillary adenopathy.   Neurological:     General: No focal deficit present.     Mental Status: He is alert and oriented to person, place, and time.   Psychiatric:         Mood and Affect: Mood normal.        Behavior: Behavior normal.     LABS:   CBC  Component Value Date/Time   WBC 6.4 01/26/2024 0831   RBC 2.94 (L) 01/26/2024 0831   HGB 9.5 (L) 01/26/2024 0831   HCT 28.8 (L) 01/26/2024 0831   PLT 525 (H) 01/26/2024 0831   MCV 98.0 01/26/2024 0831   MCH 32.3 01/26/2024 0831   MCHC 33.0 01/26/2024 0831   RDW 28.7 (H) 01/26/2024 0831   LYMPHSABS 2.3 01/26/2024 0831   MONOABS 0.9 01/26/2024 0831   EOSABS 0.2 01/26/2024 0831   BASOSABS 0.1 01/26/2024 0831    CMP      Component Value Date/Time   NA 136 12/22/2023 1135   K 3.7 12/22/2023 1135   CL 99 12/22/2023 1135   CO2 26 12/22/2023 1135   GLUCOSE 182 (H) 12/22/2023 1135   BUN 24 (H) 12/22/2023 1135   CREATININE 1.24 12/22/2023 1135   CALCIUM 9.6 12/22/2023 1135   PROT 8.2 (H) 12/22/2023 1135   ALBUMIN 4.3 12/22/2023 1135   AST 27 12/22/2023 1135   ALT 28 12/22/2023 1135   ALKPHOS 57 12/22/2023 1135   BILITOT 2.0 (H) 12/22/2023 1135   GFRNONAA >60 12/22/2023 1135     No results found for: CEA1, CEA / No results found for: CEA1, CEA No results found for: PSA1 No results found for: UJW119 No results found for: JYN829  Lab Results  Component Value Date   TOTALPROTELP 8.2 09/11/2021   ALBUMINELP 4.6 (H) 09/11/2021   A1GS 0.2 09/11/2021   A2GS 0.5 09/11/2021   BETS 1.0 09/11/2021   GAMS 1.9 (H) 09/11/2021   MSPIKE Not Observed 09/11/2021   SPEI Comment 09/11/2021   Lab Results  Component Value Date   TIBC 236 (L) 12/22/2023   TIBC 240 (L) 11/08/2023   TIBC 198 (L) 09/28/2023   FERRITIN 1,026 (H) 12/22/2023   FERRITIN 1,275 (H) 11/08/2023   FERRITIN 1,663 (H) 09/28/2023   IRONPCTSAT 106 (H) 12/22/2023   IRONPCTSAT 102 (H) 11/08/2023   IRONPCTSAT 88 (H) 09/28/2023   Lab Results  Component Value Date   LDH 142 06/08/2022   LDH 174 04/05/2022   LDH 160 09/11/2021     STUDIES:   No results found.

## 2024-01-26 ENCOUNTER — Inpatient Hospital Stay

## 2024-01-26 ENCOUNTER — Inpatient Hospital Stay (HOSPITAL_BASED_OUTPATIENT_CLINIC_OR_DEPARTMENT_OTHER): Admitting: Hematology

## 2024-01-26 VITALS — BP 160/89 | HR 92 | Temp 98.2°F | Resp 19 | Wt 234.0 lb

## 2024-01-26 DIAGNOSIS — D469 Myelodysplastic syndrome, unspecified: Secondary | ICD-10-CM

## 2024-01-26 DIAGNOSIS — D461 Refractory anemia with ring sideroblasts: Secondary | ICD-10-CM | POA: Diagnosis not present

## 2024-01-26 LAB — CBC WITH DIFFERENTIAL/PLATELET
Abs Immature Granulocytes: 0.04 10*3/uL (ref 0.00–0.07)
Basophils Absolute: 0.1 10*3/uL (ref 0.0–0.1)
Basophils Relative: 1 %
Eosinophils Absolute: 0.2 10*3/uL (ref 0.0–0.5)
Eosinophils Relative: 3 %
HCT: 28.8 % — ABNORMAL LOW (ref 39.0–52.0)
Hemoglobin: 9.5 g/dL — ABNORMAL LOW (ref 13.0–17.0)
Immature Granulocytes: 1 %
Lymphocytes Relative: 36 %
Lymphs Abs: 2.3 10*3/uL (ref 0.7–4.0)
MCH: 32.3 pg (ref 26.0–34.0)
MCHC: 33 g/dL (ref 30.0–36.0)
MCV: 98 fL (ref 80.0–100.0)
Monocytes Absolute: 0.9 10*3/uL (ref 0.1–1.0)
Monocytes Relative: 15 %
Neutro Abs: 2.8 10*3/uL (ref 1.7–7.7)
Neutrophils Relative %: 44 %
Platelets: 525 10*3/uL — ABNORMAL HIGH (ref 150–400)
RBC: 2.94 MIL/uL — ABNORMAL LOW (ref 4.22–5.81)
RDW: 28.7 % — ABNORMAL HIGH (ref 11.5–15.5)
WBC: 6.4 10*3/uL (ref 4.0–10.5)
nRBC: 0.3 % — ABNORMAL HIGH (ref 0.0–0.2)

## 2024-01-26 MED ORDER — DEFERASIROX 360 MG PO TABS
1440.0000 mg | ORAL_TABLET | Freq: Every day | ORAL | 4 refills | Status: DC
Start: 2024-01-26 — End: 2024-02-06

## 2024-01-26 MED ORDER — LUSPATERCEPT-AAMT 75 MG ~~LOC~~ SOLR
100.0000 mg | Freq: Once | SUBCUTANEOUS | Status: AC
  Administered 2024-01-26: 100 mg via SUBCUTANEOUS
  Filled 2024-01-26: qty 1.5

## 2024-01-26 NOTE — Patient Instructions (Signed)
 [facility] [place] - A DEPT OF [facility]  Discharge Instructions: Thank you for choosing [facility] to provide your oncology and hematology care.  If you have a lab appointment with the Cancer Center - please note that after April 8th, 2024, all labs will be drawn in the cancer center.  You do not have to check in or register with the main entrance as you have in the past but will complete your check-in in the cancer center.  Wear comfortable clothing and clothing appropriate for easy access to any Portacath or PICC line.   We strive to give you quality time with your provider. You may need to reschedule your appointment if you arrive late (15 or more minutes).  Arriving late affects you and other patients whose appointments are after yours.  Also, if you miss three or more appointments without notifying the office, you may be dismissed from the clinic at the provider's discretion.      For prescription refill requests, have your pharmacy contact our office and allow 72 hours for refills to be completed.    Today you received the following chemotherapy and/or immunotherapy agents Reblozyl.  Luspatercept Injection What is this medication? LUSPATERCEPT (lus PAT er sept) treats low levels of red blood cells (anemia) in the body in people with beta thalassemia or myelodysplastic syndromes. It works by helping the body make more red blood cells. This medicine may be used for other purposes; ask your health care provider or pharmacist if you have questions. COMMON BRAND NAME(S): REBLOZYL What should I tell my care team before I take this medication? They need to know if you have any of these conditions: Have had your spleen removed High blood pressure History of blood clots Tobacco use An unusual or allergic reaction to luspatercept, other medications, foods, dyes, or preservatives Pregnant or trying to get pregnant Breastfeeding How should I use this  medication? This medication is injected under the skin. It is given by your care team in a hospital or clinic setting. Talk to your care team about the use of the medication in children. It is not approved for use in children. Overdosage: If you think you have taken too much of this medicine contact a poison control center or emergency room at once. NOTE: This medicine is only for you. Do not share this medicine with others. What if I miss a dose? Keep appointments for follow-up doses. It is important not to miss your dose. Call your care team if you are unable to keep an appointment. What may interact with this medication? Interactions are not expected. This list may not describe all possible interactions. Give your health care provider a list of all the medicines, herbs, non-prescription drugs, or dietary supplements you use. Also tell them if you smoke, drink alcohol, or use illegal drugs. Some items may interact with your medicine. What should I watch for while using this medication? Your condition will be monitored carefully while you are receiving this medication. You may need blood work done while you are taking this medication. Talk to your care team if you may be pregnant. Serious birth defects can occur if you take this medication during pregnancy. Contraception is recommended while taking this medication. Your care team can help you find the option that works for you. Talk to your care team before breastfeeding. Changes to your treatment plan may be needed. What side effects may I notice from receiving this medication? Side  effects that you should report to your care team as soon as possible: Allergic reactions--skin rash, itching, hives, swelling of the face, lips, tongue, or throat Blood clot--pain, swelling, or warmth in the leg, shortness of breath, chest pain Increase in blood pressure Severe back pain, numbness or weakness of the hands, arms, legs, or feet, loss of coordination,  loss of bowel or bladder control Side effects that usually do not require medical attention (report these to your care team if they continue or are bothersome): Bone pain Dizziness Fatigue Headache Joint pain Muscle pain Stomach pain This list may not describe all possible side effects. Call your doctor for medical advice about side effects. You may report side effects to FDA at 1-800-FDA-1088. Where should I keep my medication? This medication is given in a hospital or clinic. It will not be stored at home. NOTE: This sheet is a summary. It may not cover all possible information. If you have questions about this medicine, talk to your doctor, pharmacist, or health care provider.  2024 Elsevier/Gold Standard (2022-12-24 00:00:00)       To help prevent nausea and vomiting after your treatment, we encourage you to take your nausea medication as directed.  BELOW ARE SYMPTOMS THAT SHOULD BE REPORTED IMMEDIATELY: *FEVER GREATER THAN 100.4 F (38 C) OR HIGHER *CHILLS OR SWEATING *NAUSEA AND VOMITING THAT IS NOT CONTROLLED WITH YOUR NAUSEA MEDICATION *UNUSUAL SHORTNESS OF BREATH *UNUSUAL BRUISING OR BLEEDING *URINARY PROBLEMS (pain or burning when urinating, or frequent urination) *BOWEL PROBLEMS (unusual diarrhea, constipation, pain near the anus) TENDERNESS IN MOUTH AND THROAT WITH OR WITHOUT PRESENCE OF ULCERS (sore throat, sores in mouth, or a toothache) UNUSUAL RASH, SWELLING OR PAIN  UNUSUAL VAGINAL DISCHARGE OR ITCHING   Items with * indicate a potential emergency and should be followed up as soon as possible or go to the Emergency Department if any problems should occur.  Please show the CHEMOTHERAPY ALERT CARD or IMMUNOTHERAPY ALERT CARD at check-in to the Emergency Department and triage nurse.  Should you have questions after your visit or need to cancel or reschedule your appointment, please contact [facility] [place] - A DEPT OF [facility]  [phone]  and follow the prompts.  Office hours are 8:00 a.m. to 4:30 p.m. Monday - Friday. Please note that voicemails left after 4:00 p.m. may not be returned until the following business day.  We are closed weekends and major holidays. You have access to a nurse at all times for urgent questions. Please call the main number to the clinic [phone] and follow the prompts.  For any non-urgent questions, you may also contact your provider using MyChart. We now offer e-Visits for anyone 15 and older to request care online for non-urgent symptoms. For details visit [email or web address].   Also download the MyChart app! Go to the app store, search "MyChart", open the app, select [place], and log in with your MyChart username and password.

## 2024-01-26 NOTE — Patient Instructions (Signed)
 [facility] at [facility] Discharge Instructions   You were seen and examined today by Dr. Cheree Cords.  He reviewed the results of your lab work which are normal/stable. Your hemoglobin today is 9.5.   We will proceed with your Reblozyl  injection today.   Return as scheduled.    Thank you for choosing [facility] at [facility] to provide your oncology and hematology care.  To afford each patient quality time with our provider, please arrive at least 15 minutes before your scheduled appointment time.   If you have a lab appointment with the Cancer Center please come in thru the Main Entrance and check in at the main information desk.  You need to re-schedule your appointment should you arrive 10 or more minutes late.  We strive to give you quality time with our providers, and arriving late affects you and other patients whose appointments are after yours.  Also, if you no show three or more times for appointments you may be dismissed from the clinic at the providers discretion.     Again, thank you for choosing [facility].  Our hope is that these requests will decrease the amount of time that you wait before being seen by our physicians.       _____________________________________________________________  Should you have questions after your visit to [facility], please contact our office at [phone] and follow the prompts.  Our office hours are 8:00 a.m. and 4:30 p.m. Monday - Friday.  Please note that voicemails left after 4:00 p.m. may not be returned until the following business day.  We are closed weekends and major holidays.  You do have access to a nurse 24-7, just call the main number to the clinic [phone] and do not press any options, hold on the line and a nurse will answer the phone.    For prescription refill requests, have your pharmacy contact our office and allow 72 hours.    Due to Covid, you  will need to wear a mask upon entering the hospital. If you do not have a mask, a mask will be given to you at the Main Entrance upon arrival. For doctor visits, patients may have 1 support person age 45 or older with them. For treatment visits, patients can not have anyone with them due to social distancing guidelines and our immunocompromised population.

## 2024-01-26 NOTE — Progress Notes (Signed)
 Stephen Watts presents today for injection per the provider's orders.  Reblozyl  administration without incident; injection site WNL; see MAR for injection details.  Patient tolerated procedure well and without incident.  No questions or complaints noted at this time.  Discharged from clinic ambulatory in stable condition. Alert and oriented x 3. F/U with [facility] as scheduled.

## 2024-01-26 NOTE — Addendum Note (Signed)
 Addended by: Alen Amy on: 01/26/2024 10:28 AM   Modules accepted: Orders

## 2024-01-31 DIAGNOSIS — E119 Type 2 diabetes mellitus without complications: Secondary | ICD-10-CM | POA: Diagnosis not present

## 2024-02-02 ENCOUNTER — Inpatient Hospital Stay

## 2024-02-02 ENCOUNTER — Inpatient Hospital Stay: Admitting: Hematology

## 2024-02-06 ENCOUNTER — Other Ambulatory Visit: Payer: Self-pay | Admitting: Hematology

## 2024-02-07 ENCOUNTER — Other Ambulatory Visit: Payer: Self-pay

## 2024-02-15 ENCOUNTER — Other Ambulatory Visit: Payer: Self-pay

## 2024-02-15 DIAGNOSIS — D469 Myelodysplastic syndrome, unspecified: Secondary | ICD-10-CM

## 2024-02-16 ENCOUNTER — Inpatient Hospital Stay

## 2024-02-16 ENCOUNTER — Encounter: Payer: Self-pay | Admitting: Hematology

## 2024-02-16 ENCOUNTER — Inpatient Hospital Stay: Attending: Hematology

## 2024-02-16 VITALS — BP 132/70 | HR 76 | Temp 96.7°F | Resp 18

## 2024-02-16 DIAGNOSIS — Z833 Family history of diabetes mellitus: Secondary | ICD-10-CM | POA: Diagnosis not present

## 2024-02-16 DIAGNOSIS — Z9049 Acquired absence of other specified parts of digestive tract: Secondary | ICD-10-CM | POA: Insufficient documentation

## 2024-02-16 DIAGNOSIS — R5383 Other fatigue: Secondary | ICD-10-CM | POA: Insufficient documentation

## 2024-02-16 DIAGNOSIS — Z8249 Family history of ischemic heart disease and other diseases of the circulatory system: Secondary | ICD-10-CM | POA: Insufficient documentation

## 2024-02-16 DIAGNOSIS — Z882 Allergy status to sulfonamides status: Secondary | ICD-10-CM | POA: Diagnosis not present

## 2024-02-16 DIAGNOSIS — D461 Refractory anemia with ring sideroblasts: Secondary | ICD-10-CM | POA: Diagnosis not present

## 2024-02-16 DIAGNOSIS — R7989 Other specified abnormal findings of blood chemistry: Secondary | ICD-10-CM | POA: Insufficient documentation

## 2024-02-16 DIAGNOSIS — Z803 Family history of malignant neoplasm of breast: Secondary | ICD-10-CM | POA: Diagnosis not present

## 2024-02-16 DIAGNOSIS — D75839 Thrombocytosis, unspecified: Secondary | ICD-10-CM | POA: Insufficient documentation

## 2024-02-16 DIAGNOSIS — D469 Myelodysplastic syndrome, unspecified: Secondary | ICD-10-CM

## 2024-02-16 DIAGNOSIS — Z886 Allergy status to analgesic agent status: Secondary | ICD-10-CM | POA: Insufficient documentation

## 2024-02-16 DIAGNOSIS — I10 Essential (primary) hypertension: Secondary | ICD-10-CM | POA: Insufficient documentation

## 2024-02-16 DIAGNOSIS — Z800 Family history of malignant neoplasm of digestive organs: Secondary | ICD-10-CM | POA: Diagnosis not present

## 2024-02-16 DIAGNOSIS — Z79899 Other long term (current) drug therapy: Secondary | ICD-10-CM | POA: Diagnosis not present

## 2024-02-16 LAB — CBC WITH DIFFERENTIAL/PLATELET
Abs Immature Granulocytes: 0.02 K/uL (ref 0.00–0.07)
Basophils Absolute: 0.1 K/uL (ref 0.0–0.1)
Basophils Relative: 1 %
Eosinophils Absolute: 0.1 K/uL (ref 0.0–0.5)
Eosinophils Relative: 2 %
HCT: 33 % — ABNORMAL LOW (ref 39.0–52.0)
Hemoglobin: 10.7 g/dL — ABNORMAL LOW (ref 13.0–17.0)
Immature Granulocytes: 0 %
Lymphocytes Relative: 37 %
Lymphs Abs: 1.9 K/uL (ref 0.7–4.0)
MCH: 31.8 pg (ref 26.0–34.0)
MCHC: 32.4 g/dL (ref 30.0–36.0)
MCV: 98.2 fL (ref 80.0–100.0)
Monocytes Absolute: 0.7 K/uL (ref 0.1–1.0)
Monocytes Relative: 14 %
Neutro Abs: 2.4 K/uL (ref 1.7–7.7)
Neutrophils Relative %: 46 %
Platelets: 460 K/uL — ABNORMAL HIGH (ref 150–400)
RBC: 3.36 MIL/uL — ABNORMAL LOW (ref 4.22–5.81)
RDW: 27.4 % — ABNORMAL HIGH (ref 11.5–15.5)
Smear Review: NORMAL
WBC: 5.1 K/uL (ref 4.0–10.5)
nRBC: 0 % (ref 0.0–0.2)

## 2024-02-16 MED ORDER — LUSPATERCEPT-AAMT 75 MG ~~LOC~~ SOLR
100.0000 mg | Freq: Once | SUBCUTANEOUS | Status: AC
  Administered 2024-02-16: 100 mg via SUBCUTANEOUS
  Filled 2024-02-16: qty 1.5

## 2024-02-16 NOTE — Progress Notes (Signed)
Hooper Petteway presents today for Reblozyl injection per the provider's orders.  Stable during  administration without incident; injection site WNL; see MAR for injection details.  Patient tolerated procedure well and without incident.  No questions or complaints noted at this time.

## 2024-02-16 NOTE — Patient Instructions (Signed)
 [facility] [place] - A DEPT OF [facility]  Discharge Instructions: Thank you for choosing [facility] to provide your oncology and hematology care.  If you have a lab appointment with the Cancer Center - please note that after April 8th, 2024, all labs will be drawn in the cancer center.  You do not have to check in or register with the main entrance as you have in the past but will complete your check-in in the cancer center.  Wear comfortable clothing and clothing appropriate for easy access to any Portacath or PICC line.   We strive to give you quality time with your provider. You may need to reschedule your appointment if you arrive late (15 or more minutes).  Arriving late affects you and other patients whose appointments are after yours.  Also, if you miss three or more appointments without notifying the office, you may be dismissed from the clinic at the provider's discretion.      For prescription refill requests, have your pharmacy contact our office and allow 72 hours for refills to be completed.    Today you received the following chemotherapy and/or immunotherapy agents Reblozyl      To help prevent nausea and vomiting after your treatment, we encourage you to take your nausea medication as directed.  BELOW ARE SYMPTOMS THAT SHOULD BE REPORTED IMMEDIATELY: *FEVER GREATER THAN 100.4 F (38 C) OR HIGHER *CHILLS OR SWEATING *NAUSEA AND VOMITING THAT IS NOT CONTROLLED WITH YOUR NAUSEA MEDICATION *UNUSUAL SHORTNESS OF BREATH *UNUSUAL BRUISING OR BLEEDING *URINARY PROBLEMS (pain or burning when urinating, or frequent urination) *BOWEL PROBLEMS (unusual diarrhea, constipation, pain near the anus) TENDERNESS IN MOUTH AND THROAT WITH OR WITHOUT PRESENCE OF ULCERS (sore throat, sores in mouth, or a toothache) UNUSUAL RASH, SWELLING OR PAIN  UNUSUAL VAGINAL DISCHARGE OR ITCHING   Items with * indicate a potential emergency and should be followed up  as soon as possible or go to the Emergency Department if any problems should occur.  Please show the CHEMOTHERAPY ALERT CARD or IMMUNOTHERAPY ALERT CARD at check-in to the Emergency Department and triage nurse.  Should you have questions after your visit or need to cancel or reschedule your appointment, please contact [facility] [place] - A DEPT OF [facility] [phone]  and follow the prompts.  Office hours are 8:00 a.m. to 4:30 p.m. Monday - Friday. Please note that voicemails left after 4:00 p.m. may not be returned until the following business day.  We are closed weekends and major holidays. You have access to a nurse at all times for urgent questions. Please call the main number to the clinic [phone] and follow the prompts.  For any non-urgent questions, you may also contact your provider using MyChart. We now offer e-Visits for anyone 55 and older to request care online for non-urgent symptoms. For details visit [email or web address].   Also download the MyChart app! Go to the app store, search "MyChart", open the app, select [place], and log in with your MyChart username and password.

## 2024-03-08 ENCOUNTER — Inpatient Hospital Stay (HOSPITAL_BASED_OUTPATIENT_CLINIC_OR_DEPARTMENT_OTHER): Admitting: Hematology

## 2024-03-08 ENCOUNTER — Other Ambulatory Visit (HOSPITAL_COMMUNITY): Payer: Self-pay

## 2024-03-08 ENCOUNTER — Ambulatory Visit

## 2024-03-08 ENCOUNTER — Inpatient Hospital Stay

## 2024-03-08 ENCOUNTER — Encounter: Payer: Self-pay | Admitting: Hematology

## 2024-03-08 ENCOUNTER — Other Ambulatory Visit

## 2024-03-08 VITALS — BP 116/70 | HR 64 | Temp 97.8°F | Resp 16 | Wt 236.3 lb

## 2024-03-08 DIAGNOSIS — Z833 Family history of diabetes mellitus: Secondary | ICD-10-CM | POA: Diagnosis not present

## 2024-03-08 DIAGNOSIS — D469 Myelodysplastic syndrome, unspecified: Secondary | ICD-10-CM

## 2024-03-08 DIAGNOSIS — D461 Refractory anemia with ring sideroblasts: Secondary | ICD-10-CM | POA: Diagnosis not present

## 2024-03-08 DIAGNOSIS — Z886 Allergy status to analgesic agent status: Secondary | ICD-10-CM | POA: Diagnosis not present

## 2024-03-08 DIAGNOSIS — Z79899 Other long term (current) drug therapy: Secondary | ICD-10-CM | POA: Diagnosis not present

## 2024-03-08 DIAGNOSIS — I10 Essential (primary) hypertension: Secondary | ICD-10-CM | POA: Diagnosis not present

## 2024-03-08 DIAGNOSIS — D75839 Thrombocytosis, unspecified: Secondary | ICD-10-CM | POA: Diagnosis not present

## 2024-03-08 DIAGNOSIS — Z800 Family history of malignant neoplasm of digestive organs: Secondary | ICD-10-CM | POA: Diagnosis not present

## 2024-03-08 DIAGNOSIS — R5383 Other fatigue: Secondary | ICD-10-CM | POA: Diagnosis not present

## 2024-03-08 DIAGNOSIS — Z9049 Acquired absence of other specified parts of digestive tract: Secondary | ICD-10-CM | POA: Diagnosis not present

## 2024-03-08 DIAGNOSIS — R7989 Other specified abnormal findings of blood chemistry: Secondary | ICD-10-CM | POA: Diagnosis not present

## 2024-03-08 DIAGNOSIS — Z8249 Family history of ischemic heart disease and other diseases of the circulatory system: Secondary | ICD-10-CM | POA: Diagnosis not present

## 2024-03-08 DIAGNOSIS — Z803 Family history of malignant neoplasm of breast: Secondary | ICD-10-CM | POA: Diagnosis not present

## 2024-03-08 DIAGNOSIS — Z882 Allergy status to sulfonamides status: Secondary | ICD-10-CM | POA: Diagnosis not present

## 2024-03-08 LAB — CBC WITH DIFFERENTIAL/PLATELET
Abs Immature Granulocytes: 0.01 K/uL (ref 0.00–0.07)
Basophils Absolute: 0.1 K/uL (ref 0.0–0.1)
Basophils Relative: 2 %
Eosinophils Absolute: 0.1 K/uL (ref 0.0–0.5)
Eosinophils Relative: 3 %
HCT: 38 % — ABNORMAL LOW (ref 39.0–52.0)
Hemoglobin: 12.6 g/dL — ABNORMAL LOW (ref 13.0–17.0)
Immature Granulocytes: 0 %
Lymphocytes Relative: 46 %
Lymphs Abs: 2 K/uL (ref 0.7–4.0)
MCH: 32.2 pg (ref 26.0–34.0)
MCHC: 33.2 g/dL (ref 30.0–36.0)
MCV: 97.2 fL (ref 80.0–100.0)
Monocytes Absolute: 0.8 K/uL (ref 0.1–1.0)
Monocytes Relative: 19 %
Neutro Abs: 1.3 K/uL — ABNORMAL LOW (ref 1.7–7.7)
Neutrophils Relative %: 30 %
Platelets: 412 K/uL — ABNORMAL HIGH (ref 150–400)
RBC: 3.91 MIL/uL — ABNORMAL LOW (ref 4.22–5.81)
RDW: 27.2 % — ABNORMAL HIGH (ref 11.5–15.5)
Smear Review: NORMAL
WBC: 4.3 K/uL (ref 4.0–10.5)
nRBC: 0.5 % — ABNORMAL HIGH (ref 0.0–0.2)

## 2024-03-08 LAB — HEPATIC FUNCTION PANEL
ALT: 24 U/L (ref 0–44)
AST: 22 U/L (ref 15–41)
Albumin: 4.2 g/dL (ref 3.5–5.0)
Alkaline Phosphatase: 73 U/L (ref 38–126)
Bilirubin, Direct: 0.2 mg/dL (ref 0.0–0.2)
Indirect Bilirubin: 2.3 mg/dL — ABNORMAL HIGH (ref 0.3–0.9)
Total Bilirubin: 2.5 mg/dL — ABNORMAL HIGH (ref 0.0–1.2)
Total Protein: 7.9 g/dL (ref 6.5–8.1)

## 2024-03-08 LAB — IRON AND TIBC
Iron: 280 ug/dL — ABNORMAL HIGH (ref 45–182)
Saturation Ratios: 120 % — ABNORMAL HIGH (ref 17.9–39.5)
TIBC: 233 ug/dL — ABNORMAL LOW (ref 250–450)

## 2024-03-08 LAB — FERRITIN: Ferritin: 1331 ng/mL — ABNORMAL HIGH (ref 24–336)

## 2024-03-08 MED ORDER — LUSPATERCEPT-AAMT 75 MG ~~LOC~~ SOLR: 100.0000 mg | Freq: Once | SUBCUTANEOUS | Status: DC

## 2024-03-08 NOTE — Progress Notes (Signed)
 [facility] [address], [place] [address]    Clinic Day:  03/08/2024  Referring physician: Eliezer Devaughn PARAS, PA  Patient Care Team: Stephen Watts Watts, Stephen Watts Watts as PCP - General (Family Medicine) Stephen Hai, Watts as Medical Oncologist (Hematology)   ASSESSMENT & PLAN:   Assessment: 1. MDS with ringed sideroblasts and thrombocytosis: - Patient seen for normocytic anemia with CBC on 05/08/2021 showing hemoglobin 8.4, MCV 97 and platelet count 631. - Ferritin was 1454, percent saturation was 85.  Hemochromatosis was negative. - He is taking iron tablet 2 times daily for the last 1 year. - Colonoscopy was reportedly done in [place] 3 years ago, within normal limits. - BMBX (10/08/2021): Hypercellular marrow with erythroid hyperplasia, erythroid dysplasia and ring sideroblasts.  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 - IPSS M: Score is -0.73 (low risk).  IPSS-R score 2.5.  Median leukemia free survival was 5.9%.  Median overall survival was 6 years.  AML transformation rate was 1.7 %/year. - Ring sideroblasts were more than 15%.  JAK2 V617F and reflex testing and BCR/ABL by FISH were negative for thrombocytosis. - Serum EPO was 107. - Retacrit  30,000 units weekly started on 11/26/2021, dose changed to 40,000 units every 2 weeks on 12/31/2021, dose increased to 60,000 units weekly on 03/01/2022.  Last dose of Retacrit  on 06/22/2022.  Luspatercept  1 mg/kg started on 06/15/2022.   2. 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 - Denies any smoking history. - Paternal aunt had pancreatic cancer.  Sister had breast cancer.   3.  Elevated ferritin levels: - Testing negative for C282Y and H63D - Expanded [employer] testing negative for 6 genes (FTH1, HAMP,HFE,HjV,SLC40A1,TFR2) - MRI liver  (08/30/2022): Severe hepatic steatosis with marked hepatic iron deposition.  Calculated hepatic iron content is 17.1 mg/g.    Plan: 1. MDS with ring sideroblasts and thrombocytosis: - Luspatercept  dose increased to 100 mg every 3 weeks on 12/22/2023. - She is tolerating it well.  LFTs show total bilirubin of 2.5, indirect bilirubin 2.3. - Hemoglobin is 12.6.  Will hold luspatercept  today. - Will change luspatercept  every 4 weeks. - RTC 3 months for follow-up with repeat labs including ferritin and iron panel.  2.  Elevated ferritin (hemochromatosis negative): - Jadenu  360 mg, 4 tablets daily started on 10/13/2023. - He denies any GI side effects. - Ferritin today has increased to 1331 from 1026 on 12/22/2023.  Will continue 4 tablets daily.  If it continues to rise, we will increase the dose.  3.  Hypertension: - Continue lisinopril, HCTZ and Norvasc.  Blood pressure today is 116/70.   4.  Fatigue: - This has improved as his hemoglobin is staying above 10.  Previous TSH and testosterone  levels were normal.    Orders Placed This Encounter  Procedures   CBC with Differential    Standing Status:   Future    Expected Date:   05/03/2024    Expiration Date:   05/04/2025   CBC with Differential    Standing Status:   Future    Expected Date:   06/07/2024    Expiration Date:   09/05/2024   Comprehensive metabolic panel    Standing Status:   Future    Expected Date:   06/07/2024    Expiration Date:   09/05/2024   Iron and  TIBC ([facility] ONLY)    Standing Status:   Future    Expected Date:   06/07/2024    Expiration Date:   09/05/2024   Ferritin    Standing Status:   Future    Expected Date:   06/07/2024    Expiration Date:   09/05/2024     Stephen Watts Watts,acting as a scribe for Stephen Stands, Watts.,have documented all relevant documentation on the behalf of Stephen Stands, Watts,as directed by  Stephen Stands, Watts while in the presence of Stephen Stands,  Watts.  I, Stephen Watts Watts, have reviewed the above documentation for accuracy and completeness, and I agree with the above. \     Stephen Stands, Watts   7/31/20251:40 PM  CHIEF COMPLAINT:   Diagnosis: MDS    Cancer Staging  No matching staging information was found for the patient.    Prior Therapy: Retacrit  60,000 units weekly, 11/26/21 - 06/22/22   Current Therapy:  Luspatercept  every 3 weeks    HISTORY OF PRESENT ILLNESS:   Oncology History  MDS (myelodysplastic syndrome) (HCC)  11/14/2021 Initial Diagnosis   MDS (myelodysplastic syndrome) (HCC)   06/15/2022 -  Chemotherapy   Patient is on Treatment Plan : MYELODYSPLASIA Luspatercept  q28d        INTERVAL HISTORY:   Stephen Watts Watts is a 63 y.o. male presenting to clinic today for follow up of MDS. He was last seen by me on 01/26/2024.  Today, he states that he is doing well overall. His appetite level is at 100%. His energy level is at 75%. Stephen Watts Watts is taking luspatercept  as prescribed. He reports feeling more energized today.   PAST MEDICAL HISTORY:   Past Medical History: Past Medical History:  Diagnosis Date   Anemia    Diabetes (HCC)    Hypertension     Surgical History: Past Surgical History:  Procedure Laterality Date   APPENDECTOMY     age 63 years old    Social History: Social History   Socioeconomic History   Marital status: Widowed    Spouse name: Not on file   Number of children: Not on file   Years of education: Not on file   Highest education level: Not on file  Occupational History   Not on file  Tobacco Use   Smoking status: Never    Passive exposure: Never   Smokeless tobacco: Never  Substance and Sexual Activity   Alcohol use: Never   Drug use: Never   Sexual activity: Not on file  Other Topics Concern   Not on file  Social History Narrative   Not on file   Social Drivers of Health   Financial Resource Strain: Not on file  Food Insecurity: Not on file  Transportation  Needs: Not on file  Physical Activity: Not on file  Stress: Not on file  Social Connections: Not on file  Intimate Partner Violence: Not on file    Family History: Family History  Problem Relation Age of Onset   Heart disease Mother    Diabetes Sister     Current Medications:  Current Outpatient Medications:    amLODipine (NORVASC) 10 MG tablet, Take 10 mg by mouth daily., Disp: , Rfl:    Deferasirox  360 MG TABS, TAKE 4 TABLETS BY MOUTH DAILY. TAKE ON EMPTY STOMACH., Disp: 120 tablet, Rfl: 4   ferrous sulfate 325 (65 FE) MG tablet, Take 325 mg by mouth 2 (two) times a week., Disp: , Rfl:    glimepiride (AMARYL) 4 MG  tablet, Take 4 mg by mouth daily., Disp: , Rfl:    hydrochlorothiazide (HYDRODIURIL) 25 MG tablet, Take 25 mg by mouth daily., Disp: , Rfl:    levothyroxine (SYNTHROID) 125 MCG tablet, TAKE 1 TABLET BY MOUTH MONDAY THRU SATURDAY, Disp: , Rfl:    lisinopril (ZESTRIL) 40 MG tablet, Take 40 mg by mouth daily., Disp: , Rfl:    metFORMIN (GLUCOPHAGE) 1000 MG tablet, Take 1,000 mg by mouth 2 (two) times daily., Disp: , Rfl:    metoprolol succinate (TOPROL-XL) 50 MG 24 hr tablet, Take 50 mg by mouth daily., Disp: , Rfl:    polyethylene glycol-electrolytes (NULYTELY) 420 g solution, Take 4,000 mLs by mouth once., Disp: , Rfl:    sildenafil (VIAGRA) 50 MG tablet, Take 50 mg by mouth daily as needed., Disp: , Rfl:    sitaGLIPtin (JANUVIA) 100 MG tablet, Take 100 mg by mouth daily., Disp: , Rfl:  No current facility-administered medications for this visit.  Facility-Administered Medications Ordered in Other Visits:    acetaminophen  (TYLENOL ) 325 MG tablet, , , ,    loratadine  (CLARITIN ) 10 MG tablet, , , ,    Allergies: Allergies  Allergen Reactions   Ibuprofen Hives   Sulfa Antibiotics Rash and Dermatitis   Onion Swelling   Olmesartan Hives    REVIEW OF SYSTEMS:   Review of Systems  Constitutional:  Negative for chills, fatigue and fever.  HENT:   Negative for  lump/mass, mouth sores, nosebleeds, sore throat and trouble swallowing.   Eyes:  Negative for eye problems.  Respiratory:  Negative for cough and shortness of breath.   Cardiovascular:  Negative for chest pain, leg swelling and palpitations.  Gastrointestinal:  Negative for abdominal pain, constipation, diarrhea, nausea and vomiting.  Genitourinary:  Negative for bladder incontinence, difficulty urinating, dysuria, frequency, hematuria and nocturia.   Musculoskeletal:  Negative for arthralgias, back pain, flank pain, myalgias and neck pain.  Skin:  Negative for itching and rash.  Neurological:  Negative for dizziness, headaches and numbness.  Hematological:  Does not bruise/bleed easily.  Psychiatric/Behavioral:  Negative for depression, sleep disturbance and suicidal ideas. The patient is not nervous/anxious.   All other systems reviewed and are negative.    VITALS:   Blood pressure 116/70, pulse 64, temperature 97.8 F (36.6 C), temperature source Oral, resp. rate 16, weight 236 lb 5.3 oz (107.2 kg), SpO2 100%.  Wt Readings from Last 3 Encounters:  03/08/24 236 lb 5.3 oz (107.2 kg)  01/26/24 233 lb 15.9 oz (106.1 kg)  12/22/23 237 lb 10.5 oz (107.8 kg)    Body mass index is 37.02 kg/m.  Performance status (ECOG): 1 - Symptomatic but completely ambulatory  PHYSICAL EXAM:   Physical Exam Vitals and nursing note reviewed. Exam conducted with a chaperone present.  Constitutional:      Appearance: Normal appearance.  Cardiovascular:     Rate and Rhythm: Normal rate and regular rhythm.     Pulses: Normal pulses.     Heart sounds: Normal heart sounds.  Pulmonary:     Effort: Pulmonary effort is normal.     Breath sounds: Normal breath sounds.  Abdominal:     Palpations: Abdomen is soft. There is no hepatomegaly, splenomegaly or mass.     Tenderness: There is no abdominal tenderness.  Musculoskeletal:     Right lower leg: No edema.     Left lower leg: No edema.   Lymphadenopathy:     Cervical: No cervical adenopathy.     Right cervical:  No superficial, deep or posterior cervical adenopathy.    Left cervical: No superficial, deep or posterior cervical adenopathy.     Upper Body:     Right upper body: No supraclavicular or axillary adenopathy.     Left upper body: No supraclavicular or axillary adenopathy.  Neurological:     General: No focal deficit present.     Mental Status: He is alert and oriented to person, place, and time.  Psychiatric:        Mood and Affect: Mood normal.        Behavior: Behavior normal.     LABS:   CBC     Component Value Date/Time   WBC 4.3 03/08/2024 0919   RBC 3.91 (L) 03/08/2024 0919   HGB 12.6 (L) 03/08/2024 0919   HCT 38.0 (L) 03/08/2024 0919   PLT 412 (H) 03/08/2024 0919   MCV 97.2 03/08/2024 0919   MCH 32.2 03/08/2024 0919   MCHC 33.2 03/08/2024 0919   RDW 27.2 (H) 03/08/2024 0919   LYMPHSABS 2.0 03/08/2024 0919   MONOABS 0.8 03/08/2024 0919   EOSABS 0.1 03/08/2024 0919   BASOSABS 0.1 03/08/2024 0919    CMP      Component Value Date/Time   NA 136 12/22/2023 1135   K 3.7 12/22/2023 1135   CL 99 12/22/2023 1135   CO2 26 12/22/2023 1135   GLUCOSE 182 (H) 12/22/2023 1135   BUN 24 (H) 12/22/2023 1135   CREATININE 1.24 12/22/2023 1135   CALCIUM 9.6 12/22/2023 1135   PROT 7.9 03/08/2024 0919   ALBUMIN 4.2 03/08/2024 0919   AST 22 03/08/2024 0919   ALT 24 03/08/2024 0919   ALKPHOS 73 03/08/2024 0919   BILITOT 2.5 (H) 03/08/2024 0919   GFRNONAA >60 12/22/2023 1135     No results found for: CEA1, CEA / No results found for: CEA1, CEA No results found for: PSA1 No results found for: CAN199 No results found for: RJW874  Lab Results  Component Value Date   TOTALPROTELP 8.2 09/11/2021   ALBUMINELP 4.6 (H) 09/11/2021   A1GS 0.2 09/11/2021   A2GS 0.5 09/11/2021   BETS 1.0 09/11/2021   GAMS 1.9 (H) 09/11/2021   MSPIKE Not Observed 09/11/2021   SPEI Comment 09/11/2021    Lab Results  Component Value Date   TIBC 233 (L) 03/08/2024   TIBC 236 (L) 12/22/2023   TIBC 240 (L) 11/08/2023   FERRITIN 1,331 (H) 03/08/2024   FERRITIN 1,026 (H) 12/22/2023   FERRITIN 1,275 (H) 11/08/2023   IRONPCTSAT 120 (H) 03/08/2024   IRONPCTSAT 106 (H) 12/22/2023   IRONPCTSAT 102 (H) 11/08/2023   Lab Results  Component Value Date   LDH 142 06/08/2022   LDH 174 04/05/2022   LDH 160 09/11/2021     STUDIES:   No results found.

## 2024-03-08 NOTE — Progress Notes (Signed)
 Patient presents today for Reblozyl  injection and office visit. Patient's Hgb 12.6, Reblozyl  to be held per Dr Rogers.

## 2024-03-08 NOTE — Patient Instructions (Signed)
 [facility] Discharge Instructions   You were seen and examined today by Dr. Rogers.  He reviewed the results of your lab work which are normal/stable. You do not require the Reblozyl  injection today.   We will space out the injection to every 4 weeks.  We will see you back in 3 months. We will repeat lab work at that time.   Return as scheduled.    Thank you for choosing [facility] at [facility] to provide your oncology and hematology care.  To afford each patient quality time with our provider, please arrive at least 15 minutes before your scheduled appointment time.   If you have a lab appointment with the Cancer Center please come in thru the Main Entrance and check in at the main information desk.  You need to re-schedule your appointment should you arrive 10 or more minutes late.  We strive to give you quality time with our providers, and arriving late affects you and other patients whose appointments are after yours.  Also, if you no show three or more times for appointments you may be dismissed from the clinic at the providers discretion.     Again, thank you for choosing [facility].  Our hope is that these requests will decrease the amount of time that you wait before being seen by our physicians.       _____________________________________________________________  Should you have questions after your visit to [facility], please contact our office at [phone] and follow the prompts.  Our office hours are 8:00 a.m. and 4:30 p.m. Monday - Friday.  Please note that voicemails left after 4:00 p.m. may not be returned until the following business day.  We are closed weekends and major holidays.  You do have access to a nurse 24-7, just call the main number to the clinic [phone] and do not press any options, hold on the line and a nurse will answer the phone.    For prescription refill  requests, have your pharmacy contact our office and allow 72 hours.    Due to Covid, you will need to wear a mask upon entering the hospital. If you do not have a mask, a mask will be given to you at the Main Entrance upon arrival. For doctor visits, patients may have 1 support person age 9 or older with them. For treatment visits, patients can not have anyone with them due to social distancing guidelines and our immunocompromised population.

## 2024-03-09 ENCOUNTER — Telehealth: Payer: Self-pay | Admitting: Pharmacy Technician

## 2024-03-09 ENCOUNTER — Other Ambulatory Visit: Payer: Self-pay

## 2024-03-09 NOTE — Telephone Encounter (Signed)
 Oral Oncology Patient Advocate Encounter  Prior Authorization for Deferasirox  has been approved.    PA# [id number] Effective dates: 03/09/2024 through 03/09/2025  Patient can continue filling at [facility].   Zoraida Havrilla (Patty) Chet Burnet, CPhT  [facility], [place], [place] Oral Chemotherapy Patient Advocate Specialist III Phone: [phone]  Fax: [phone]

## 2024-03-09 NOTE — Telephone Encounter (Signed)
 Oral Oncology Patient Advocate Encounter   Received notification that prior authorization for Deferasirox  is due for renewal.   PA submitted on CMM on 03/09/2024 Key [id number] Status is pending     Alaine Loughney (Patty) Chet Burnet, CPhT  [facility], [place], [place] Oral Chemotherapy Patient Advocate Specialist III Phone: [phone]  Fax: [phone]

## 2024-03-13 ENCOUNTER — Encounter: Payer: Self-pay | Admitting: Hematology

## 2024-03-13 NOTE — Progress Notes (Signed)
 Appt cancelled, no note needed

## 2024-04-05 ENCOUNTER — Inpatient Hospital Stay: Attending: Hematology

## 2024-04-05 ENCOUNTER — Inpatient Hospital Stay

## 2024-04-05 ENCOUNTER — Encounter: Payer: Self-pay | Admitting: Oncology

## 2024-04-05 VITALS — BP 113/56 | HR 72 | Temp 96.7°F | Resp 20 | Wt 240.6 lb

## 2024-04-05 DIAGNOSIS — D469 Myelodysplastic syndrome, unspecified: Secondary | ICD-10-CM

## 2024-04-05 DIAGNOSIS — Z79899 Other long term (current) drug therapy: Secondary | ICD-10-CM | POA: Insufficient documentation

## 2024-04-05 DIAGNOSIS — D461 Refractory anemia with ring sideroblasts: Secondary | ICD-10-CM | POA: Diagnosis not present

## 2024-04-05 DIAGNOSIS — D75839 Thrombocytosis, unspecified: Secondary | ICD-10-CM | POA: Insufficient documentation

## 2024-04-05 LAB — CBC WITH DIFFERENTIAL/PLATELET
Abs Immature Granulocytes: 0.4 K/uL — ABNORMAL HIGH (ref 0.00–0.07)
Basophils Absolute: 0.1 K/uL (ref 0.0–0.1)
Basophils Relative: 1 %
Eosinophils Absolute: 0.3 K/uL (ref 0.0–0.5)
Eosinophils Relative: 4 %
HCT: 25.4 % — ABNORMAL LOW (ref 39.0–52.0)
Hemoglobin: 8.4 g/dL — ABNORMAL LOW (ref 13.0–17.0)
Lymphocytes Relative: 42 %
Lymphs Abs: 2.7 K/uL (ref 0.7–4.0)
MCH: 31.2 pg (ref 26.0–34.0)
MCHC: 33.1 g/dL (ref 30.0–36.0)
MCV: 94.4 fL (ref 80.0–100.0)
Metamyelocytes Relative: 5 %
Monocytes Absolute: 0.4 K/uL (ref 0.1–1.0)
Monocytes Relative: 6 %
Myelocytes: 1 %
Neutro Abs: 2.6 K/uL (ref 1.7–7.7)
Neutrophils Relative %: 41 %
Platelets: 536 K/uL — ABNORMAL HIGH (ref 150–400)
RBC: 2.69 MIL/uL — ABNORMAL LOW (ref 4.22–5.81)
RDW: 27.8 % — ABNORMAL HIGH (ref 11.5–15.5)
WBC: 6.4 K/uL (ref 4.0–10.5)
nRBC: 0 % (ref 0.0–0.2)

## 2024-04-05 MED ORDER — LUSPATERCEPT-AAMT 75 MG ~~LOC~~ SOLR
100.0000 mg | Freq: Once | SUBCUTANEOUS | Status: AC
  Administered 2024-04-05: 100 mg via SUBCUTANEOUS
  Filled 2024-04-05: qty 0.5
  Filled 2024-04-05: qty 2

## 2024-04-05 NOTE — Progress Notes (Signed)
 Patient presents today for Reblozyl  injection. Vital signs and lab work within parameters for treatment. HGB 8.4. Blood pressure 113/56.  Stephen Watts presents today for injection per the provider's orders.  Reblozyl  administration without incident; injection site WNL; see MAR for injection details.  Patient tolerated procedure well and without incident.  No questions or concerns noted at this time. Discharged from clinic ambulatory in stable condition. Alert and oriented x 3. F/U with [facility] as scheduled.

## 2024-04-05 NOTE — Patient Instructions (Signed)
 [facility] [place] - A DEPT OF [facility]  Discharge Instructions: Thank you for choosing [facility] to provide your oncology and hematology care.  If you have a lab appointment with the Cancer Center - please note that after April 8th, 2024, all labs will be drawn in the cancer center.  You do not have to check in or register with the main entrance as you have in the past but will complete your check-in in the cancer center.  Wear comfortable clothing and clothing appropriate for easy access to any Portacath or PICC line.   We strive to give you quality time with your provider. You may need to reschedule your appointment if you arrive late (15 or more minutes).  Arriving late affects you and other patients whose appointments are after yours.  Also, if you miss three or more appointments without notifying the office, you may be dismissed from the clinic at the provider's discretion.      For prescription refill requests, have your pharmacy contact our office and allow 72 hours for refills to be completed.    Today you received the following chemotherapy and/or immunotherapy agents Reblozyl.  Luspatercept Injection What is this medication? LUSPATERCEPT (lus PAT er sept) treats low levels of red blood cells (anemia) in the body in people with beta thalassemia or myelodysplastic syndromes. It works by helping the body make more red blood cells. This medicine may be used for other purposes; ask your health care provider or pharmacist if you have questions. COMMON BRAND NAME(S): REBLOZYL What should I tell my care team before I take this medication? They need to know if you have any of these conditions: Have had your spleen removed High blood pressure History of blood clots Tobacco use An unusual or allergic reaction to luspatercept, other medications, foods, dyes, or preservatives Pregnant or trying to get pregnant Breastfeeding How should I use this  medication? This medication is injected under the skin. It is given by your care team in a hospital or clinic setting. Talk to your care team about the use of the medication in children. It is not approved for use in children. Overdosage: If you think you have taken too much of this medicine contact a poison control center or emergency room at once. NOTE: This medicine is only for you. Do not share this medicine with others. What if I miss a dose? Keep appointments for follow-up doses. It is important not to miss your dose. Call your care team if you are unable to keep an appointment. What may interact with this medication? Interactions are not expected. This list may not describe all possible interactions. Give your health care provider a list of all the medicines, herbs, non-prescription drugs, or dietary supplements you use. Also tell them if you smoke, drink alcohol, or use illegal drugs. Some items may interact with your medicine. What should I watch for while using this medication? Your condition will be monitored carefully while you are receiving this medication. You may need blood work done while you are taking this medication. Talk to your care team if you may be pregnant. Serious birth defects can occur if you take this medication during pregnancy. Contraception is recommended while taking this medication. Your care team can help you find the option that works for you. Talk to your care team before breastfeeding. Changes to your treatment plan may be needed. What side effects may I notice from receiving this medication? Side  effects that you should report to your care team as soon as possible: Allergic reactions--skin rash, itching, hives, swelling of the face, lips, tongue, or throat Blood clot--pain, swelling, or warmth in the leg, shortness of breath, chest pain Increase in blood pressure Severe back pain, numbness or weakness of the hands, arms, legs, or feet, loss of coordination,  loss of bowel or bladder control Side effects that usually do not require medical attention (report these to your care team if they continue or are bothersome): Bone pain Dizziness Fatigue Headache Joint pain Muscle pain Stomach pain This list may not describe all possible side effects. Call your doctor for medical advice about side effects. You may report side effects to FDA at 1-800-FDA-1088. Where should I keep my medication? This medication is given in a hospital or clinic. It will not be stored at home. NOTE: This sheet is a summary. It may not cover all possible information. If you have questions about this medicine, talk to your doctor, pharmacist, or health care provider.  2024 Elsevier/Gold Standard (2022-12-24 00:00:00)       To help prevent nausea and vomiting after your treatment, we encourage you to take your nausea medication as directed.  BELOW ARE SYMPTOMS THAT SHOULD BE REPORTED IMMEDIATELY: *FEVER GREATER THAN 100.4 F (38 C) OR HIGHER *CHILLS OR SWEATING *NAUSEA AND VOMITING THAT IS NOT CONTROLLED WITH YOUR NAUSEA MEDICATION *UNUSUAL SHORTNESS OF BREATH *UNUSUAL BRUISING OR BLEEDING *URINARY PROBLEMS (pain or burning when urinating, or frequent urination) *BOWEL PROBLEMS (unusual diarrhea, constipation, pain near the anus) TENDERNESS IN MOUTH AND THROAT WITH OR WITHOUT PRESENCE OF ULCERS (sore throat, sores in mouth, or a toothache) UNUSUAL RASH, SWELLING OR PAIN  UNUSUAL VAGINAL DISCHARGE OR ITCHING   Items with * indicate a potential emergency and should be followed up as soon as possible or go to the Emergency Department if any problems should occur.  Please show the CHEMOTHERAPY ALERT CARD or IMMUNOTHERAPY ALERT CARD at check-in to the Emergency Department and triage nurse.  Should you have questions after your visit or need to cancel or reschedule your appointment, please contact [facility] [place] - A DEPT OF [facility]  [phone]  and follow the prompts.  Office hours are 8:00 a.m. to 4:30 p.m. Monday - Friday. Please note that voicemails left after 4:00 p.m. may not be returned until the following business day.  We are closed weekends and major holidays. You have access to a nurse at all times for urgent questions. Please call the main number to the clinic [phone] and follow the prompts.  For any non-urgent questions, you may also contact your provider using MyChart. We now offer e-Visits for anyone 15 and older to request care online for non-urgent symptoms. For details visit [email or web address].   Also download the MyChart app! Go to the app store, search "MyChart", open the app, select [place], and log in with your MyChart username and password.

## 2024-04-17 ENCOUNTER — Other Ambulatory Visit: Payer: Self-pay

## 2024-05-03 ENCOUNTER — Encounter: Payer: Self-pay | Admitting: Oncology

## 2024-05-03 ENCOUNTER — Inpatient Hospital Stay

## 2024-05-03 ENCOUNTER — Inpatient Hospital Stay: Attending: Hematology

## 2024-05-03 VITALS — BP 118/64 | HR 81 | Temp 98.1°F | Resp 18

## 2024-05-03 DIAGNOSIS — D469 Myelodysplastic syndrome, unspecified: Secondary | ICD-10-CM

## 2024-05-03 DIAGNOSIS — Z79899 Other long term (current) drug therapy: Secondary | ICD-10-CM | POA: Diagnosis not present

## 2024-05-03 DIAGNOSIS — D75839 Thrombocytosis, unspecified: Secondary | ICD-10-CM | POA: Diagnosis not present

## 2024-05-03 DIAGNOSIS — D461 Refractory anemia with ring sideroblasts: Secondary | ICD-10-CM | POA: Insufficient documentation

## 2024-05-03 LAB — CBC WITH DIFFERENTIAL/PLATELET
Abs Immature Granulocytes: 0.02 K/uL (ref 0.00–0.07)
Basophils Absolute: 0.1 K/uL (ref 0.0–0.1)
Basophils Relative: 1 %
Eosinophils Absolute: 0.2 K/uL (ref 0.0–0.5)
Eosinophils Relative: 3 %
HCT: 29.5 % — ABNORMAL LOW (ref 39.0–52.0)
Hemoglobin: 9.7 g/dL — ABNORMAL LOW (ref 13.0–17.0)
Immature Granulocytes: 0 %
Lymphocytes Relative: 32 %
Lymphs Abs: 1.8 K/uL (ref 0.7–4.0)
MCH: 31.1 pg (ref 26.0–34.0)
MCHC: 32.9 g/dL (ref 30.0–36.0)
MCV: 94.6 fL (ref 80.0–100.0)
Monocytes Absolute: 1 K/uL (ref 0.1–1.0)
Monocytes Relative: 18 %
Neutro Abs: 2.6 K/uL (ref 1.7–7.7)
Neutrophils Relative %: 46 %
Platelets: 522 K/uL — ABNORMAL HIGH (ref 150–400)
RBC: 3.12 MIL/uL — ABNORMAL LOW (ref 4.22–5.81)
RDW: 27.7 % — ABNORMAL HIGH (ref 11.5–15.5)
WBC: 5.6 K/uL (ref 4.0–10.5)
nRBC: 0 % (ref 0.0–0.2)

## 2024-05-03 MED ORDER — LUSPATERCEPT-AAMT 75 MG ~~LOC~~ SOLR
100.0000 mg | Freq: Once | SUBCUTANEOUS | Status: AC
  Administered 2024-05-03: 100 mg via SUBCUTANEOUS
  Filled 2024-05-03: qty 2
  Filled 2024-05-03: qty 1.5

## 2024-05-03 NOTE — Progress Notes (Signed)
 Luspatercept -aamt injection given per orders. Patient tolerated it well without problems. Vitals stable and discharged home from clinic ambulatory. Follow up as scheduled.

## 2024-05-03 NOTE — Patient Instructions (Signed)
 [facility] - A DEPT OF [facility]  Discharge Instructions: Thank you for choosing [facility] to provide your oncology and hematology care.  If you have a lab appointment with the Cancer Center - please note that after April 8th, 2024, all labs will be drawn in the cancer center.  You do not have to check in or register with the main entrance as you have in the past but will complete your check-in in the cancer center.  Wear comfortable clothing and clothing appropriate for easy access to any Portacath or PICC line.   We strive to give you quality time with your provider. You may need to reschedule your appointment if you arrive late (15 or more minutes).  Arriving late affects you and other patients whose appointments are after yours.  Also, if you miss three or more appointments without notifying the office, you may be dismissed from the clinic at the provider's discretion.      For prescription refill requests, have your pharmacy contact our office and allow 72 hours for refills to be completed.    Today you received the following Luspaterecept   To help prevent nausea and vomiting after your treatment, we encourage you to take your nausea medication as directed.  BELOW ARE SYMPTOMS THAT SHOULD BE REPORTED IMMEDIATELY: *FEVER GREATER THAN 100.4 F (38 C) OR HIGHER *CHILLS OR SWEATING *NAUSEA AND VOMITING THAT IS NOT CONTROLLED WITH YOUR NAUSEA MEDICATION *UNUSUAL SHORTNESS OF BREATH *UNUSUAL BRUISING OR BLEEDING *URINARY PROBLEMS (pain or burning when urinating, or frequent urination) *BOWEL PROBLEMS (unusual diarrhea, constipation, pain near the anus) TENDERNESS IN MOUTH AND THROAT WITH OR WITHOUT PRESENCE OF ULCERS (sore throat, sores in mouth, or a toothache) UNUSUAL RASH, SWELLING OR PAIN  UNUSUAL VAGINAL DISCHARGE OR ITCHING   Items with * indicate a potential emergency and should be followed up as soon as possible or go to the  Emergency Department if any problems should occur.  Please show the CHEMOTHERAPY ALERT CARD or IMMUNOTHERAPY ALERT CARD at check-in to the Emergency Department and triage nurse.  Should you have questions after your visit or need to cancel or reschedule your appointment, please contact [facility]  - A DEPT OF [facility] [phone]  and follow the prompts.  Office hours are 8:00 a.m. to 4:30 p.m. Monday - Friday. Please note that voicemails left after 4:00 p.m. may not be returned until the following business day.  We are closed weekends and major holidays. You have access to a nurse at all times for urgent questions. Please call the main number to the clinic [phone] and follow the prompts.  For any non-urgent questions, you may also contact your provider using MyChart. We now offer e-Visits for anyone 41 and older to request care online for non-urgent symptoms. For details visit [email or web address].   Also download the MyChart app! Go to the app store, search MyChart, open the app, select [place], and log in with your MyChart username and password.

## 2024-05-31 ENCOUNTER — Inpatient Hospital Stay

## 2024-05-31 ENCOUNTER — Encounter: Payer: Self-pay | Admitting: Oncology

## 2024-05-31 ENCOUNTER — Inpatient Hospital Stay: Attending: Hematology | Admitting: Oncology

## 2024-05-31 VITALS — BP 134/69 | HR 66 | Temp 97.4°F | Resp 18 | Ht 67.0 in | Wt 240.8 lb

## 2024-05-31 DIAGNOSIS — N179 Acute kidney failure, unspecified: Secondary | ICD-10-CM | POA: Diagnosis not present

## 2024-05-31 DIAGNOSIS — Z79899 Other long term (current) drug therapy: Secondary | ICD-10-CM | POA: Diagnosis not present

## 2024-05-31 DIAGNOSIS — D469 Myelodysplastic syndrome, unspecified: Secondary | ICD-10-CM

## 2024-05-31 DIAGNOSIS — D75839 Thrombocytosis, unspecified: Secondary | ICD-10-CM | POA: Insufficient documentation

## 2024-05-31 DIAGNOSIS — D461 Refractory anemia with ring sideroblasts: Secondary | ICD-10-CM | POA: Insufficient documentation

## 2024-05-31 LAB — CBC WITH DIFFERENTIAL/PLATELET
Abs Immature Granulocytes: 0.02 K/uL (ref 0.00–0.07)
Basophils Absolute: 0.1 K/uL (ref 0.0–0.1)
Basophils Relative: 1 %
Eosinophils Absolute: 0.6 K/uL — ABNORMAL HIGH (ref 0.0–0.5)
Eosinophils Relative: 9 %
HCT: 32.3 % — ABNORMAL LOW (ref 39.0–52.0)
Hemoglobin: 10.3 g/dL — ABNORMAL LOW (ref 13.0–17.0)
Immature Granulocytes: 0 %
Lymphocytes Relative: 32 %
Lymphs Abs: 2.2 K/uL (ref 0.7–4.0)
MCH: 30.4 pg (ref 26.0–34.0)
MCHC: 31.9 g/dL (ref 30.0–36.0)
MCV: 95.3 fL (ref 80.0–100.0)
Monocytes Absolute: 1.2 K/uL — ABNORMAL HIGH (ref 0.1–1.0)
Monocytes Relative: 17 %
Neutro Abs: 2.8 K/uL (ref 1.7–7.7)
Neutrophils Relative %: 41 %
Platelets: 410 K/uL — ABNORMAL HIGH (ref 150–400)
RBC: 3.39 MIL/uL — ABNORMAL LOW (ref 4.22–5.81)
RDW: 26.9 % — ABNORMAL HIGH (ref 11.5–15.5)
Smear Review: NORMAL
WBC: 6.8 K/uL (ref 4.0–10.5)
nRBC: 0 % (ref 0.0–0.2)

## 2024-05-31 LAB — IRON AND TIBC
Iron: 208 ug/dL — ABNORMAL HIGH (ref 45–182)
Saturation Ratios: 83 % — ABNORMAL HIGH (ref 17.9–39.5)
TIBC: 249 ug/dL — ABNORMAL LOW (ref 250–450)
UIBC: 41 ug/dL

## 2024-05-31 LAB — COMPREHENSIVE METABOLIC PANEL WITH GFR
ALT: 11 U/L (ref 0–44)
AST: 20 U/L (ref 15–41)
Albumin: 4.4 g/dL (ref 3.5–5.0)
Alkaline Phosphatase: 80 U/L (ref 38–126)
Anion gap: 10 (ref 5–15)
BUN: 18 mg/dL (ref 8–23)
CO2: 28 mmol/L (ref 22–32)
Calcium: 9.3 mg/dL (ref 8.9–10.3)
Chloride: 102 mmol/L (ref 98–111)
Creatinine, Ser: 1.46 mg/dL — ABNORMAL HIGH (ref 0.61–1.24)
GFR, Estimated: 53 mL/min — ABNORMAL LOW (ref >60)
Glucose, Bld: 245 mg/dL — ABNORMAL HIGH (ref 70–99)
Potassium: 4 mmol/L (ref 3.5–5.1)
Sodium: 140 mmol/L (ref 135–145)
Total Bilirubin: 0.9 mg/dL (ref 0.0–1.2)
Total Protein: 7.6 g/dL (ref 6.5–8.1)

## 2024-05-31 LAB — FERRITIN: Ferritin: 1306 ng/mL — ABNORMAL HIGH (ref 24–336)

## 2024-05-31 MED ORDER — LUSPATERCEPT-AAMT 75 MG ~~LOC~~ SOLR
100.0000 mg | Freq: Once | SUBCUTANEOUS | Status: AC
  Administered 2024-05-31: 100 mg via SUBCUTANEOUS
  Filled 2024-05-31: qty 0.5
  Filled 2024-05-31: qty 2

## 2024-05-31 NOTE — Progress Notes (Addendum)
 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 male was transferred to my care after his prior physician has left.   ASSESSMENT & PLAN:   Assessment & Plan: Stephen Watts  is a 63 y.o. male with MDS and elevated ferritin  MDS IPSS M: Score is -0.73 (low risk).  IPSS-R score 2.5.  Median leukemia free survival was 5.9%.  Median overall survival was 6 years.  AML transformation rate was 1.7 %/year.  Hematology history below Patient was initially started on Retacrit  and then changed to luspatercept  as he was not responding very well  - Continue luspatercept  100 mg every 3 weeks. - Labs reviewed today:CMP: Creatinine: 1.46, normal LFTs, CBC: Hemoglobin: 10.3, platelets: 410, WBC: 6.8. - Physical exam stable today.  Will proceed with luspatercept  today. - Will continue to monitor counts closely.  Return to clinic in 3 months with labs for follow-up  Elevated ferritin levels Expanded [facility] hemochromatosis panel negative for 6 genes(FTH1, HAMP, HFE, HjV, SLC40A1, TFR2).  Hemochromatosis gene testing negative for C282Y and H63D. MRI liver with features of iron deposition  - Patient on Jadenu  360 mg, 4 tablets daily started on 10/13/2023.  Continue with the same dose. - No side effects today - Ferritin stable at 1306.  AKI Patient has worsening creatinine.  Recommended IV fluids today.  Patient suggested drinking more water and did not want IV fluids today   The patient understands the plans discussed today and is in agreement with them.  He knows to contact our office if he develops concerns prior to his next appointment.  20 minutes of total time was spent for this patient encounter, including preparation,review of records,   face-to-face counseling with the patient and coordination of care, physical exam, and documentation of the encounter.    LILLETTE Verneta SAUNDERS Teague,acting as a Neurosurgeon for Mickiel Dry, MD.,have documented all relevant documentation on the behalf of Mickiel Dry, MD,as directed by  Mickiel Dry, MD while in the presence of Mickiel Dry, MD.  I, Mickiel Dry MD, have reviewed the above documentation for accuracy and completeness, and I agree with the above.    Mickiel Dry, MD  [facility] [place] - A DEPT OF [facility] [address] [place] [address] Dept: [phone] Dept Fax: [phone]   No orders of the defined types were placed in this encounter.    ONCOLOGY HISTORY:   I have reviewed his chart and materials related to his cancer extensively and collaborated history with the patient. Summary of oncologic history is as follows:   Diagnosis: MDS with ring sideroblasts and thrombocytosis   -05/08/2021: CBC : RBC:2.69. Hgb: 8.4. PLT: 631. nRBC 2%. -09/11/2021: Ferritin-:1,070. Iron saturation: 85%. Hemochromatosis negative.  -10/08/2021: Bone Marrow Biopsy. Pathology: Hypercellular marrow with erythroid hyperplasia (50%), dyspoiesis and ring sideroblasts consistent with a myelodysplastic/myeloproliferative neoplasm with ring sideroblasts (>15%), abundant iron storage, and thrombocytosis.  Chromosome Analysis: Normal male karyotype. 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 -IPSS M: Score is -0.73 (low risk).  IPSS-R score 2.5.  Median leukemia free survival was 5.9%.  Median overall survival was 6 years.  AML transformation rate was 1.7 %/year.  -11/12/2021:  JAK2 V617F and reflex testing and BCR/ABL by FISH were  negative for thrombocytosis.  -11/12/2021: Serum EPO: 107.9 -11/26/2021-06/22/2022: Retacrit  30,000 units weekly, dose changed to 40,000 units every 2 weeks on 12/31/2021, dose increased to  60,00 units weekly on 03/01/2022 -06/15/2022-current:  Luspatercept  1 mg/kg, dose increased to 100 mg every 3 weeks on 12/22/2023  Diagnosis:  Elevated ferritin (hemochromatosis negative)   -09/11/2021: Ferritin 1,070. Iron saturation 85%. Hemochromatosis negative (C282Y and H63D). -Expanded [facility] testing negative for 6 genes (FTH1, HAMP, HFE, HjV, SLC40A1, TFR2) -08/30/2022: MRI liver: Severe hepatic steatosis and marked hepatic iron deposition.  Calculated hepatic iron content 17.1 mg/g. -10/13/2023-current: Jadenu  360 mg, 4 tablets daily  -05/31/2024: Ferritin continues to remain elevated at 1,306  Current Treatment: Luspatercept  100 mg every 3 weeks.  INTERVAL HISTORY:   Stephen Watts is here today for follow up and to establish care with me for MDS. 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  He denies feeling dehydrated, though his creatinine is elevated today. He states he is not drinking an adequate amount of water. He notes a normal appetite. He denies any fevers, chills, or night sweats. Overall doing well with no complaints.   I have reviewed the past medical history, past surgical history, social history and family history with the patient and they are unchanged from previous note.  ALLERGIES:  is allergic to ibuprofen, sulfa antibiotics, onion, and olmesartan.  MEDICATIONS:  Current Outpatient Medications  Medication Sig Dispense Refill   amLODipine (NORVASC) 10 MG tablet Take 10 mg by mouth daily.     Deferasirox  360 MG TABS TAKE 4 TABLETS BY MOUTH DAILY. TAKE ON EMPTY STOMACH. 120 tablet 4   ferrous sulfate 325 (65 FE) MG tablet Take 325 mg by mouth 2 (two) times a week.     glimepiride (AMARYL) 2 MG tablet Take 2 mg by mouth every morning.     hydrochlorothiazide (HYDRODIURIL) 25 MG tablet Take 25 mg by mouth daily.     levothyroxine (SYNTHROID) 125 MCG tablet TAKE 1 TABLET BY MOUTH MONDAY THRU SATURDAY     lisinopril  (ZESTRIL) 40 MG tablet Take 40 mg by mouth daily.     metFORMIN (GLUCOPHAGE) 1000 MG tablet Take 1,000 mg by mouth 2 (two) times daily.     metoprolol succinate (TOPROL-XL) 50 MG 24 hr tablet Take 50 mg by mouth daily.     polyethylene glycol-electrolytes (NULYTELY) 420 g solution Take 4,000 mLs by mouth once.     sildenafil (VIAGRA) 50 MG tablet Take 50 mg by mouth daily as needed.     sitaGLIPtin (JANUVIA) 100 MG tablet Take 100 mg by mouth daily.     No current facility-administered medications for this visit.   Facility-Administered Medications Ordered in Other Visits  Medication Dose Route Frequency Provider Last Rate Last Admin   acetaminophen  (TYLENOL ) 325 MG tablet            loratadine  (CLARITIN ) 10 MG tablet             REVIEW OF SYSTEMS:   Constitutional: Denies fevers, chills or abnormal weight loss Eyes: Denies blurriness of vision Ears, nose, mouth, throat, and face: Denies mucositis or sore throat Respiratory: Denies cough, dyspnea or wheezes Cardiovascular: Denies palpitation, chest discomfort or lower extremity swelling Gastrointestinal:  Denies nausea, heartburn or change in bowel habits Skin: Denies abnormal skin rashes Lymphatics: Denies new lymphadenopathy or easy bruising Neurological:Denies numbness, tingling or new weaknesses Behavioral/Psych: Mood is stable, no new changes  All other systems were reviewed  with the patient and are negative.   VITALS:  Blood pressure 134/69, pulse 66, temperature (!) 97.4 F (36.3 C), temperature source Tympanic, resp. rate 18, height 5' 7 (1.702 m), weight 240 lb 12.8 oz (109.2 kg), SpO2 100%.  Wt Readings from Last 3 Encounters:  05/31/24 240 lb 12.8 oz (109.2 kg)  04/05/24 240 lb 9.6 oz (109.1 kg)  03/08/24 236 lb 5.3 oz (107.2 kg)    Body mass index is 37.71 kg/m.  Performance status (ECOG): 1 - Symptomatic but completely ambulatory  PHYSICAL EXAM:   GENERAL:alert, no distress and comfortable SKIN: skin color,  texture, turgor are normal, no rashes or significant lesions LYMPH:  no palpable lymphadenopathy in the cervical, axillary or inguinal LUNGS: clear to auscultation and percussion with normal breathing effort HEART: regular rate & rhythm and no murmurs and no lower extremity edema ABDOMEN:abdomen soft, non-tender and normal bowel sounds Musculoskeletal:no cyanosis of digits and no clubbing  NEURO: alert & oriented x 3 with fluent speech  LABORATORY DATA:  I have reviewed the data as listed  Lab Results  Component Value Date   WBC 6.8 05/31/2024   NEUTROABS 2.8 05/31/2024   HGB 10.3 (L) 05/31/2024   HCT 32.3 (L) 05/31/2024   MCV 95.3 05/31/2024   PLT 410 (H) 05/31/2024      Chemistry      Component Value Date/Time   NA 140 05/31/2024 0839   K 4.0 05/31/2024 0839   CL 102 05/31/2024 0839   CO2 28 05/31/2024 0839   BUN 18 05/31/2024 0839   CREATININE 1.46 (H) 05/31/2024 0839      Component Value Date/Time   CALCIUM 9.3 05/31/2024 0839   ALKPHOS 80 05/31/2024 0839   AST 20 05/31/2024 0839   ALT 11 05/31/2024 0839   BILITOT 0.9 05/31/2024 0839       RADIOGRAPHIC STUDIES: I have personally reviewed the radiological images as listed and agreed with the findings in the report.  MR LIVER W WO CONTRAST CLINICAL DATA:  Elevated ferritin, evaluate for liver iron deposition  EXAM: MRI ABDOMEN WITHOUT AND WITH CONTRAST  TECHNIQUE: Multiplanar multisequence MR imaging of the abdomen was performed both before and after the administration of intravenous contrast.  CONTRAST:  10mL GADAVIST  GADOBUTROL  1 MMOL/ML IV SOLN  COMPARISON:  None Available.  FINDINGS: Lower chest: No acute abnormality.  Hepatobiliary: Profound hepatic signal hypointensity on both in and opposed phase imaging, with signal inversion on inphase imaging and marked intrinsic T2 hypointensity. Fat signal fraction 19.7%. R2* = 534/s, calculated hepatic iron content 17.1 mg/g. Irregular parenchymal  scarring and focal fatty sparing of the posterior right lobe of the liver (series 63, image 40). No gallstones, gallbladder wall thickening, or biliary dilatation.  Pancreas: Unremarkable. No pancreatic ductal dilatation or surrounding inflammatory changes.  Spleen: Normal in size without significant abnormality.  Adrenals/Urinary Tract: Adrenal glands are unremarkable. Kidneys are normal, without renal calculi, solid lesion, or hydronephrosis.  Stomach/Bowel: Stomach is within normal limits. No evidence of bowel wall thickening, distention, or inflammatory changes.  Vascular/Lymphatic: No significant vascular findings are present. No enlarged abdominal lymph nodes.  Other: No abdominal wall hernia or abnormality. No ascites.  Musculoskeletal: No acute or significant osseous findings.  IMPRESSION: 1. Hepatic signal characteristics consistent with severe hepatic steatosis and marked hepatic iron deposition. Fat signal fraction 19.7%. R2 = 534/s, calculated hepatic iron content 17.1 mg/g. 2. No acute findings in the abdomen.  Electronically Signed   By: Marolyn JONETTA Marlyce CHRISTELLA.D.  On: 09/01/2023 17:06

## 2024-05-31 NOTE — Progress Notes (Signed)
 Labs reviewed at office visti today. Ok to proceed with treatment as planned.   Luspatercept  injections given per orders. Patient tolerated it well without problems. Vitals stable and discharged home from clinic ambulatory. Follow up as scheduled.

## 2024-05-31 NOTE — Patient Instructions (Addendum)
 [facility] Discharge Instructions   You were seen and examined today by Dr. Davonna.  She reviewed the results of your lab work which are normal/stable. Your hemoglobin is 10.3.   We will proceed with your injection today.   Return as scheduled.    Thank you for choosing  Cancer Center at [facility] to provide your oncology and hematology care.  To afford each patient quality time with our provider, please arrive at least 15 minutes before your scheduled appointment time.   If you have a lab appointment with the Cancer Center please come in thru the Main Entrance and check in at the main information desk.  You need to re-schedule your appointment should you arrive 10 or more minutes late.  We strive to give you quality time with our providers, and arriving late affects you and other patients whose appointments are after yours.  Also, if you no show three or more times for appointments you may be dismissed from the clinic at the providers discretion.     Again, thank you for choosing [facility].  Our hope is that these requests will decrease the amount of time that you wait before being seen by our physicians.       _____________________________________________________________  Should you have questions after your visit to [facility], please contact our office at [phone] and follow the prompts.  Our office hours are 8:00 a.m. and 4:30 p.m. Monday - Friday.  Please note that voicemails left after 4:00 p.m. may not be returned until the following business day.  We are closed weekends and major holidays.  You do have access to a nurse 24-7, just call the main number to the clinic [phone] and do not press any options, hold on the line and a nurse will answer the phone.    For prescription refill requests, have your pharmacy contact our office and allow 72 hours.    Due to Covid, you will need to wear  a mask upon entering the hospital. If you do not have a mask, a mask will be given to you at the Main Entrance upon arrival. For doctor visits, patients may have 1 support person age 70 or older with them. For treatment visits, patients can not have anyone with them due to social distancing guidelines and our immunocompromised population.

## 2024-05-31 NOTE — Patient Instructions (Signed)
 [facility] [place] - A DEPT OF [facility]  Discharge Instructions: Thank you for choosing [facility] to provide your oncology and hematology care.  If you have a lab appointment with the Cancer Center - please note that after April 8th, 2024, all labs will be drawn in the cancer center.  You do not have to check in or register with the main entrance as you have in the past but will complete your check-in in the cancer center.  Wear comfortable clothing and clothing appropriate for easy access to any Portacath or PICC line.   We strive to give you quality time with your provider. You may need to reschedule your appointment if you arrive late (15 or more minutes).  Arriving late affects you and other patients whose appointments are after yours.  Also, if you miss three or more appointments without notifying the office, you may be dismissed from the clinic at the provider's discretion.      For prescription refill requests, have your pharmacy contact our office and allow 72 hours for refills to be completed.    Today you received the following chemotherapy and/or immunotherapy agents Luspatercept  injections   To help prevent nausea and vomiting after your treatment, we encourage you to take your nausea medication as directed.  BELOW ARE SYMPTOMS THAT SHOULD BE REPORTED IMMEDIATELY: *FEVER GREATER THAN 100.4 F (38 C) OR HIGHER *CHILLS OR SWEATING *NAUSEA AND VOMITING THAT IS NOT CONTROLLED WITH YOUR NAUSEA MEDICATION *UNUSUAL SHORTNESS OF BREATH *UNUSUAL BRUISING OR BLEEDING *URINARY PROBLEMS (pain or burning when urinating, or frequent urination) *BOWEL PROBLEMS (unusual diarrhea, constipation, pain near the anus) TENDERNESS IN MOUTH AND THROAT WITH OR WITHOUT PRESENCE OF ULCERS (sore throat, sores in mouth, or a toothache) UNUSUAL RASH, SWELLING OR PAIN  UNUSUAL VAGINAL DISCHARGE OR ITCHING   Items with * indicate a potential emergency and should be  followed up as soon as possible or go to the Emergency Department if any problems should occur.  Please show the CHEMOTHERAPY ALERT CARD or IMMUNOTHERAPY ALERT CARD at check-in to the Emergency Department and triage nurse.  Should you have questions after your visit or need to cancel or reschedule your appointment, please contact [facility]  - A DEPT OF [facility] [phone]  and follow the prompts.  Office hours are 8:00 a.m. to 4:30 p.m. Monday - Friday. Please note that voicemails left after 4:00 p.m. may not be returned until the following business day.  We are closed weekends and major holidays. You have access to a nurse at all times for urgent questions. Please call the main number to the clinic [phone] and follow the prompts.  For any non-urgent questions, you may also contact your provider using MyChart. We now offer e-Visits for anyone 53 and older to request care online for non-urgent symptoms. For details visit [email or web address].   Also download the MyChart app! Go to the app store, search MyChart, open the app, select [place], and log in with your MyChart username and password.

## 2024-06-01 ENCOUNTER — Other Ambulatory Visit: Payer: Self-pay

## 2024-06-12 ENCOUNTER — Other Ambulatory Visit: Payer: Self-pay

## 2024-06-21 ENCOUNTER — Other Ambulatory Visit: Payer: Self-pay | Admitting: Oncology

## 2024-06-21 DIAGNOSIS — D469 Myelodysplastic syndrome, unspecified: Secondary | ICD-10-CM

## 2024-06-28 ENCOUNTER — Inpatient Hospital Stay

## 2024-06-28 ENCOUNTER — Encounter: Payer: Self-pay | Admitting: Oncology

## 2024-06-28 ENCOUNTER — Inpatient Hospital Stay: Attending: Hematology

## 2024-06-28 ENCOUNTER — Other Ambulatory Visit: Payer: Self-pay | Admitting: *Deleted

## 2024-06-28 VITALS — BP 121/60 | HR 64 | Temp 96.4°F | Resp 20

## 2024-06-28 DIAGNOSIS — Z79899 Other long term (current) drug therapy: Secondary | ICD-10-CM | POA: Diagnosis not present

## 2024-06-28 DIAGNOSIS — D469 Myelodysplastic syndrome, unspecified: Secondary | ICD-10-CM

## 2024-06-28 DIAGNOSIS — D75839 Thrombocytosis, unspecified: Secondary | ICD-10-CM | POA: Diagnosis not present

## 2024-06-28 DIAGNOSIS — D461 Refractory anemia with ring sideroblasts: Secondary | ICD-10-CM | POA: Diagnosis not present

## 2024-06-28 LAB — CBC WITH DIFFERENTIAL/PLATELET
Abs Immature Granulocytes: 0.01 K/uL (ref 0.00–0.07)
Basophils Absolute: 0 K/uL (ref 0.0–0.1)
Basophils Relative: 1 %
Eosinophils Absolute: 0.1 K/uL (ref 0.0–0.5)
Eosinophils Relative: 2 %
HCT: 30.7 % — ABNORMAL LOW (ref 39.0–52.0)
Hemoglobin: 9.9 g/dL — ABNORMAL LOW (ref 13.0–17.0)
Immature Granulocytes: 0 %
Lymphocytes Relative: 36 %
Lymphs Abs: 2.3 K/uL (ref 0.7–4.0)
MCH: 30.9 pg (ref 26.0–34.0)
MCHC: 32.2 g/dL (ref 30.0–36.0)
MCV: 95.9 fL (ref 80.0–100.0)
Monocytes Absolute: 0.9 K/uL (ref 0.1–1.0)
Monocytes Relative: 13 %
Neutro Abs: 3.1 K/uL (ref 1.7–7.7)
Neutrophils Relative %: 48 %
Platelets: 404 K/uL — ABNORMAL HIGH (ref 150–400)
RBC: 3.2 MIL/uL — ABNORMAL LOW (ref 4.22–5.81)
RDW: 26 % — ABNORMAL HIGH (ref 11.5–15.5)
WBC: 6.4 K/uL (ref 4.0–10.5)
nRBC: 0 % (ref 0.0–0.2)

## 2024-06-28 MED ORDER — DEFERASIROX 360 MG PO TABS: 4.0000 | ORAL_TABLET | Freq: Every day | ORAL | 4 refills | Status: AC

## 2024-06-28 MED ORDER — LUSPATERCEPT-AAMT 75 MG ~~LOC~~ SOLR
100.0000 mg | Freq: Once | SUBCUTANEOUS | Status: AC
  Administered 2024-06-28: 100 mg via SUBCUTANEOUS
  Filled 2024-06-28: qty 1.5
  Filled 2024-06-28: qty 2

## 2024-06-28 NOTE — Progress Notes (Signed)
 Patient's Hgb 9.9 and blood pressure stable. Patient tolerated Reblozyl   injection with no complaints voiced.  Site clean and dry with no bruising or swelling noted at site.  See MAR for details.  Band aid applied.  Patient stable during and after injection.  Vss with discharge and left in satisfactory condition with no s/s of distress noted. All follow ups as scheduled.   Stephen Watts

## 2024-07-26 ENCOUNTER — Inpatient Hospital Stay: Attending: Hematology

## 2024-07-26 VITALS — BP 125/71 | HR 70 | Temp 97.5°F | Resp 18 | Wt 238.9 lb

## 2024-07-26 DIAGNOSIS — D75839 Thrombocytosis, unspecified: Secondary | ICD-10-CM | POA: Insufficient documentation

## 2024-07-26 DIAGNOSIS — Z79899 Other long term (current) drug therapy: Secondary | ICD-10-CM | POA: Insufficient documentation

## 2024-07-26 DIAGNOSIS — D469 Myelodysplastic syndrome, unspecified: Secondary | ICD-10-CM

## 2024-07-26 DIAGNOSIS — D461 Refractory anemia with ring sideroblasts: Secondary | ICD-10-CM | POA: Diagnosis present

## 2024-07-26 LAB — CBC WITH DIFFERENTIAL/PLATELET
Abs Immature Granulocytes: 0.02 K/uL (ref 0.00–0.07)
Basophils Absolute: 0.1 K/uL (ref 0.0–0.1)
Basophils Relative: 1 %
Eosinophils Absolute: 0.1 K/uL (ref 0.0–0.5)
Eosinophils Relative: 1 %
HCT: 33.8 % — ABNORMAL LOW (ref 39.0–52.0)
Hemoglobin: 11 g/dL — ABNORMAL LOW (ref 13.0–17.0)
Immature Granulocytes: 0 %
Lymphocytes Relative: 38 %
Lymphs Abs: 3.4 K/uL (ref 0.7–4.0)
MCH: 30.1 pg (ref 26.0–34.0)
MCHC: 32.5 g/dL (ref 30.0–36.0)
MCV: 92.6 fL (ref 80.0–100.0)
Monocytes Absolute: 1.1 K/uL — ABNORMAL HIGH (ref 0.1–1.0)
Monocytes Relative: 12 %
Neutro Abs: 4.3 K/uL (ref 1.7–7.7)
Neutrophils Relative %: 48 %
Platelets: 545 K/uL — ABNORMAL HIGH (ref 150–400)
RBC: 3.65 MIL/uL — ABNORMAL LOW (ref 4.22–5.81)
RDW: 25.8 % — ABNORMAL HIGH (ref 11.5–15.5)
WBC: 8.9 K/uL (ref 4.0–10.5)
nRBC: 0.2 % (ref 0.0–0.2)

## 2024-07-26 MED ORDER — LUSPATERCEPT-AAMT 75 MG ~~LOC~~ SOLR
100.0000 mg | Freq: Once | SUBCUTANEOUS | Status: AC
  Administered 2024-07-26: 13:00:00 100 mg via SUBCUTANEOUS
  Filled 2024-07-26: qty 2
  Filled 2024-07-26: qty 1.5

## 2024-07-26 NOTE — Patient Instructions (Signed)
 [facility] - A DEPT OF [facility]  Discharge Instructions: Thank you for choosing [facility] to provide your oncology and hematology care.  If you have a lab appointment with the Cancer Center - please note that after April 8th, 2024, all labs will be drawn in the cancer center.  You do not have to check in or register with the main entrance as you have in the past but will complete your check-in in the cancer center.  Wear comfortable clothing and clothing appropriate for easy access to any Portacath or PICC line.   We strive to give you quality time with your provider. You may need to reschedule your appointment if you arrive late (15 or more minutes).  Arriving late affects you and other patients whose appointments are after yours.  Also, if you miss three or more appointments without notifying the office, you may be dismissed from the clinic at the provider's discretion.      For prescription refill requests, have your pharmacy contact our office and allow 72 hours for refills to be completed.    Today you received the following Reblozyl, return as scheduled.   To help prevent nausea and vomiting after your treatment, we encourage you to take your nausea medication as directed.  BELOW ARE SYMPTOMS THAT SHOULD BE REPORTED IMMEDIATELY: *FEVER GREATER THAN 100.4 F (38 C) OR HIGHER *CHILLS OR SWEATING *NAUSEA AND VOMITING THAT IS NOT CONTROLLED WITH YOUR NAUSEA MEDICATION *UNUSUAL SHORTNESS OF BREATH *UNUSUAL BRUISING OR BLEEDING *URINARY PROBLEMS (pain or burning when urinating, or frequent urination) *BOWEL PROBLEMS (unusual diarrhea, constipation, pain near the anus) TENDERNESS IN MOUTH AND THROAT WITH OR WITHOUT PRESENCE OF ULCERS (sore throat, sores in mouth, or a toothache) UNUSUAL RASH, SWELLING OR PAIN  UNUSUAL VAGINAL DISCHARGE OR ITCHING   Items with * indicate a potential emergency and should be followed up as soon as possible or  go to the Emergency Department if any problems should occur.  Please show the CHEMOTHERAPY ALERT CARD or IMMUNOTHERAPY ALERT CARD at check-in to the Emergency Department and triage nurse.  Should you have questions after your visit or need to cancel or reschedule your appointment, please contact [facility] [place] - A DEPT OF [facility] [phone]  and follow the prompts.  Office hours are 8:00 a.m. to 4:30 p.m. Monday - Friday. Please note that voicemails left after 4:00 p.m. may not be returned until the following business day.  We are closed weekends and major holidays. You have access to a nurse at all times for urgent questions. Please call the main number to the clinic [phone] and follow the prompts.  For any non-urgent questions, you may also contact your provider using MyChart. We now offer e-Visits for anyone 35 and older to request care online for non-urgent symptoms. For details visit [email or web address].   Also download the MyChart app! Go to the app store, search "MyChart", open the app, select [place], and log in with your MyChart username and password.

## 2024-07-26 NOTE — Progress Notes (Signed)
Patient presents today for Reblozyl injection. Hemoglobin reviewed prior to administration. VSS tolerated without incident or complaint. See MAR for details. Patient stable during and after injection. Patient discharged in satisfactory condition with no s/s of distress noted.

## 2024-08-06 ENCOUNTER — Encounter: Payer: Self-pay | Admitting: *Deleted

## 2024-08-16 ENCOUNTER — Other Ambulatory Visit: Payer: Self-pay | Admitting: Oncology

## 2024-08-16 ENCOUNTER — Encounter: Payer: Self-pay | Admitting: Oncology

## 2024-08-16 DIAGNOSIS — D469 Myelodysplastic syndrome, unspecified: Secondary | ICD-10-CM

## 2024-08-17 ENCOUNTER — Telehealth: Payer: Self-pay | Admitting: Oncology

## 2024-08-17 NOTE — Telephone Encounter (Signed)
 Pt called regarding his term'd  insurance coverage. He questioned how much his tx's are. I advised him that they are around $46,000 per tx.  I advised him that there maybe a assistance program that might fill in the gap while he doesn't have coverage. I emailed [employer] to see if pt qualifies for assist. He is scheduled to apply for medicare over the phone on 09/18/24.   I advised him I would call him back after hearing back from [employer].

## 2024-08-23 ENCOUNTER — Inpatient Hospital Stay: Payer: Self-pay | Attending: Hematology

## 2024-08-23 ENCOUNTER — Inpatient Hospital Stay: Payer: Self-pay

## 2024-08-23 VITALS — BP 133/65 | HR 65 | Temp 97.4°F | Resp 18

## 2024-08-23 DIAGNOSIS — D469 Myelodysplastic syndrome, unspecified: Secondary | ICD-10-CM

## 2024-08-23 LAB — CBC WITH DIFFERENTIAL/PLATELET
Abs Immature Granulocytes: 0.05 K/uL (ref 0.00–0.07)
Basophils Absolute: 0.1 K/uL (ref 0.0–0.1)
Basophils Relative: 1 %
Eosinophils Absolute: 0.2 K/uL (ref 0.0–0.5)
Eosinophils Relative: 2 %
HCT: 32.9 % — ABNORMAL LOW (ref 39.0–52.0)
Hemoglobin: 10.7 g/dL — ABNORMAL LOW (ref 13.0–17.0)
Immature Granulocytes: 1 %
Lymphocytes Relative: 40 %
Lymphs Abs: 3.2 K/uL (ref 0.7–4.0)
MCH: 30.1 pg (ref 26.0–34.0)
MCHC: 32.5 g/dL (ref 30.0–36.0)
MCV: 92.4 fL (ref 80.0–100.0)
Monocytes Absolute: 1 K/uL (ref 0.1–1.0)
Monocytes Relative: 13 %
Neutro Abs: 3.5 K/uL (ref 1.7–7.7)
Neutrophils Relative %: 43 %
Platelets: 569 K/uL — ABNORMAL HIGH (ref 150–400)
RBC: 3.56 MIL/uL — ABNORMAL LOW (ref 4.22–5.81)
RDW: 26.9 % — ABNORMAL HIGH (ref 11.5–15.5)
Smear Review: NORMAL
WBC: 8 K/uL (ref 4.0–10.5)
nRBC: 0.6 % — ABNORMAL HIGH (ref 0.0–0.2)

## 2024-08-23 MED ORDER — LUSPATERCEPT-AAMT 75 MG ~~LOC~~ SOLR: 100.0000 mg | Freq: Once | SUBCUTANEOUS | Status: DC

## 2024-08-23 NOTE — Progress Notes (Signed)
 Unable to give injection today due to approval, patient rescheduled for next week.

## 2024-08-24 ENCOUNTER — Other Ambulatory Visit: Payer: Self-pay

## 2024-08-30 ENCOUNTER — Inpatient Hospital Stay: Payer: Self-pay

## 2024-08-30 ENCOUNTER — Other Ambulatory Visit: Payer: Self-pay

## 2024-08-31 ENCOUNTER — Other Ambulatory Visit (HOSPITAL_COMMUNITY): Payer: Self-pay

## 2024-08-31 ENCOUNTER — Telehealth: Payer: Self-pay | Admitting: Pharmacy Technician

## 2024-08-31 ENCOUNTER — Encounter: Payer: Self-pay | Admitting: Oncology

## 2024-08-31 NOTE — Telephone Encounter (Signed)
 Oral Oncology Patient Advocate Encounter   Change in insurance  Member ID: [id number]   Received notification that prior authorization for Deferasirox  360 MG TABS is required.  Someone at [facility] subitted PA on 08/28/2024 (no record of it in patient's chart).  Received notification that the request for prior authorization for Deferasirox  360 MG TABS has been denied. Please see indexed document for more detailed information.   Ellis Koffler (Patty) Chet Burnet, CPhT  [facility], [place], [facility] - Oral Chemotherapy Patient Advocate Specialist III Phone: [phone]  Fax: [phone]

## 2024-09-06 ENCOUNTER — Telehealth: Payer: Self-pay | Admitting: Pharmacy Technician

## 2024-09-06 NOTE — Telephone Encounter (Signed)
 Oral Oncology Patient Advocate Encounter   An urgent appeal for the prior authorization denial of Deferasirox  has been started by the pharmacist on 09/06/2024. (Peer to Peer scheduled for 09/06/24 from 12-2pm)   This encounter will continue to be updated until final appeal determination.      Stephen Watts (Stephen Watts) Chet Burnet, CPhT  [facility], [place], [facility] - Oral Chemotherapy Patient Advocate Specialist III Phone: [phone]  Fax: [phone]

## 2024-09-07 NOTE — Telephone Encounter (Signed)
 Received call for peer-to-peer on 1/30, was given favorable response by MD on the call.   Patty to f/u on final approval from the insurance.

## 2024-09-10 ENCOUNTER — Other Ambulatory Visit (HOSPITAL_COMMUNITY): Payer: Self-pay

## 2024-09-11 ENCOUNTER — Other Ambulatory Visit (HOSPITAL_COMMUNITY): Payer: Self-pay

## 2024-09-12 ENCOUNTER — Other Ambulatory Visit: Payer: Self-pay

## 2024-09-12 NOTE — Telephone Encounter (Signed)
 Oral Oncology Patient Advocate Encounter  Prior Authorization appeal for Deferasirox  has been approved.    PA# [id number] Effective dates: 09/10/2024 through 03/09/2025  Unable to see co-pay since we do not have patient's current insurance card.   Randye Treichler (Patty) Chet Burnet, CPhT  [facility], [place], [facility] - Oral Chemotherapy Pharmacy Technician III Certified Patient Advocate Phone: [phone]  Fax: [phone]

## 2024-09-20 ENCOUNTER — Inpatient Hospital Stay: Payer: Self-pay | Attending: Hematology | Admitting: Oncology

## 2024-09-20 ENCOUNTER — Inpatient Hospital Stay: Payer: Self-pay

## 2024-09-27 ENCOUNTER — Inpatient Hospital Stay: Payer: Self-pay | Attending: Hematology

## 2024-09-27 ENCOUNTER — Inpatient Hospital Stay: Payer: Self-pay | Admitting: Oncology

## 2024-09-27 ENCOUNTER — Inpatient Hospital Stay: Payer: Self-pay

## 2024-10-25 ENCOUNTER — Inpatient Hospital Stay: Payer: Self-pay
